# Patient Record
Sex: Male | Born: 1975
Health system: Southern US, Community
[De-identification: ages and names within clinical notes are randomized; demographics above are authoritative.]

## PROBLEM LIST (undated history)

## (undated) VITALS — BP 147/85 | HR 83 | Temp 96.8°F | Resp 16

## (undated) DIAGNOSIS — F1010 Alcohol abuse, uncomplicated: Secondary | ICD-10-CM

## (undated) DIAGNOSIS — R569 Unspecified convulsions: Secondary | ICD-10-CM

---

## 2013-05-23 ENCOUNTER — Emergency Department (HOSPITAL_COMMUNITY)
Admission: EM | Admit: 2013-05-23 | Discharge: 2013-05-23 | Disposition: A | Discharge status: Home / Self Care | Payer: No Typology Code available for payment source | Attending: Emergency Medicine | Admitting: Emergency Medicine

## 2013-05-23 DIAGNOSIS — S40019A Contusion of unspecified shoulder, initial encounter: Secondary | ICD-10-CM | POA: Insufficient documentation

## 2013-05-23 DIAGNOSIS — S40011A Contusion of right shoulder, initial encounter: Secondary | ICD-10-CM

## 2013-05-23 DIAGNOSIS — Y939 Activity, unspecified: Secondary | ICD-10-CM | POA: Insufficient documentation

## 2013-05-23 DIAGNOSIS — Y92410 Unspecified street and highway as the place of occurrence of the external cause: Secondary | ICD-10-CM | POA: Insufficient documentation

## 2013-05-23 MED ORDER — IBUPROFEN 400 MG PO TABS
800.0000 mg | ORAL_TABLET | Freq: Once | ORAL | Status: AC
  Administered 2013-05-23: 800 mg via ORAL
  Filled 2013-05-23: qty 2

## 2013-05-23 MED ORDER — CYCLOBENZAPRINE HCL 10 MG PO TABS: 10.0000 mg | ORAL_TABLET | Freq: Two times a day (BID) | ORAL | Status: DC | PRN

## 2013-05-23 MED ORDER — IBUPROFEN 800 MG PO TABS: 800.0000 mg | ORAL_TABLET | Freq: Three times a day (TID) | ORAL | Status: DC
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
## 2013-05-23 NOTE — ED Notes (Signed)
Pt comfortable with d/c and f/u instructions. Prescriptions x1 

## 2013-05-24 NOTE — ED Provider Notes (Signed)
Medical screening examination/treatment/procedure(s) were performed by non-physician practitioner and as supervising physician I was immediately available for consultation/collaboration.  Reana Chacko M Chosen Geske, MD 05/24/13 0013 

## 2013-06-15 ENCOUNTER — Encounter (HOSPITAL_COMMUNITY): Payer: Self-pay

## 2013-06-15 ENCOUNTER — Emergency Department (HOSPITAL_COMMUNITY)
Admission: EM | Admit: 2013-06-15 | Discharge: 2013-06-15 | Disposition: A | Discharge status: Home / Self Care | Payer: Self-pay | Attending: Emergency Medicine | Admitting: Emergency Medicine

## 2013-06-15 DIAGNOSIS — F419 Anxiety disorder, unspecified: Secondary | ICD-10-CM

## 2013-06-15 DIAGNOSIS — F411 Generalized anxiety disorder: Secondary | ICD-10-CM | POA: Insufficient documentation

## 2013-06-15 DIAGNOSIS — Z791 Long term (current) use of non-steroidal anti-inflammatories (NSAID): Secondary | ICD-10-CM | POA: Insufficient documentation

## 2013-06-15 DIAGNOSIS — F10939 Alcohol use, unspecified with withdrawal, unspecified: Secondary | ICD-10-CM | POA: Insufficient documentation

## 2013-06-15 DIAGNOSIS — F10239 Alcohol dependence with withdrawal, unspecified: Secondary | ICD-10-CM

## 2013-06-15 HISTORY — DX: Alcohol abuse, uncomplicated: F10.10

## 2013-06-15 HISTORY — DX: Unspecified convulsions: R56.9

## 2013-06-15 LAB — CBC
Hemoglobin: 14.3 g/dL (ref 13.0–17.0)
MCH: 35 pg — ABNORMAL HIGH (ref 26.0–34.0)
RBC: 4.09 MIL/uL — ABNORMAL LOW (ref 4.22–5.81)

## 2013-06-15 LAB — BASIC METABOLIC PANEL
CO2: 24 mEq/L (ref 19–32)
Calcium: 9.7 mg/dL (ref 8.4–10.5)
Chloride: 94 mEq/L — ABNORMAL LOW (ref 96–112)
Glucose, Bld: 101 mg/dL — ABNORMAL HIGH (ref 70–99)
Sodium: 133 mEq/L — ABNORMAL LOW (ref 135–145)

## 2013-06-15 LAB — RAPID URINE DRUG SCREEN, HOSP PERFORMED
Amphetamines: NOT DETECTED
Barbiturates: NOT DETECTED
Benzodiazepines: NOT DETECTED
Cocaine: NOT DETECTED
Tetrahydrocannabinol: NOT DETECTED

## 2013-06-15 MED ORDER — LORAZEPAM 1 MG PO TABS: 1.0000 mg | ORAL_TABLET | Freq: Four times a day (QID) | ORAL | Status: DC | PRN

## 2013-06-15 NOTE — ED Notes (Signed)
Bed: WA15 Expected date:  Expected time:  Means of arrival:  Comments: ems 

## 2013-06-15 NOTE — Progress Notes (Signed)
[facility] provided patient with a [facility] Orange Card, highlighting [facility].  °

## 2013-06-15 NOTE — ED Provider Notes (Signed)
CSN: [id number]     Arrival date & time 06/15/13  1334 History   First MD Initiated Contact with Patient 06/15/13 1348     Chief Complaint  Patient presents with  . Seizures   (Consider location/radiation/quality/duration/timing/severity/associated sxs/prior Treatment) HPI Comments: 37 y/o male with a PMHx of alcohol abuse presents to the ED via EMS after having a witness seizure lasting about 1 minute while patient was at work. 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 He did not fall, and brother took him out of the lifter. Admits to drinking half of a 40-oz beer yesterday, and is trying to detox himself from alcohol use. He went through detox about 7 months ago, began drinking again a month later. Prior to yesterday, has not had a drink in a week, tends to get "shaky" and anxious when he does not drink. States "drinking is the only thing that makes me stop shaking". 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 Currently denies any other complaints at this time.  Patient is a 37 y.o. male presenting with seizures. The history is provided by the patient and the EMS personnel.  Seizures   No past medical history on file. No past surgical history on file. No family history on file. History  Substance Use Topics  . Smoking status: Not on file  . Smokeless tobacco: Not on file  . Alcohol Use: Not on file    Review of Systems  Constitutional: Negative for fever, chills and activity change.  Respiratory: Negative for shortness of breath.   Cardiovascular: Negative for chest pain.  Gastrointestinal: Negative for nausea and vomiting.  Neurological: Positive for seizures. Negative for syncope, facial asymmetry and speech difficulty.  All other systems reviewed and are negative.    Allergies  Review of patient's allergies indicates no known allergies.  Home Medications    Current Outpatient Rx  Name  Route  Sig  Dispense  Refill  . cyclobenzaprine (FLEXERIL) 10 MG tablet   Oral   Take 1 tablet (10 mg total) by mouth 2 (two) times daily as needed for muscle spasms.   15 tablet   0   . ibuprofen (ADVIL,MOTRIN) 800 MG tablet   Oral   Take 1 tablet (800 mg total) by mouth 3 (three) times daily.   21 tablet   0    BP 127/84  Pulse 94  Temp(Src) 99 F (37.2 C) (Oral)  Resp 18  SpO2 96% Physical Exam  Nursing note and vitals reviewed. Constitutional: He is oriented to person, place, and time. He appears well-developed and well-nourished. No distress.  HENT:  Head: Normocephalic and atraumatic.  Mouth/Throat: Oropharynx is clear and moist.  Eyes: Conjunctivae and EOM are normal. Pupils are equal, round, and reactive to light.  Neck: Normal range of motion. Neck supple.  Cardiovascular: Normal rate, regular rhythm and normal heart sounds.   Pulmonary/Chest: Effort normal and breath sounds normal.  Abdominal: Soft. Bowel sounds are normal. There is no tenderness.  Musculoskeletal: Normal range of motion. He exhibits no edema.  Neurological: He is alert and oriented to person, place, and time. He has normal strength. No sensory deficit. He displays no seizure activity. GCS eye subscore is 4. GCS verbal subscore is 5. GCS motor subscore is 6.  Shaky  Skin: Skin is warm and dry. He is not diaphoretic.  Psychiatric: His speech is normal and  behavior is normal. Thought content normal. His mood appears anxious. He expresses no homicidal and no suicidal ideation.    ED Course  Procedures (including critical care time) Labs Review Labs Reviewed  CBC  BASIC METABOLIC PANEL  URINE RAPID DRUG SCREEN (HOSP PERFORMED)  ETHANOL   Imaging Review No results found.  MDM   1. Anxiety   2. Alcohol withdrawal    Patient noted to be "shaky" while at work today. Concern for withdrawal seizure. In the ED, he is in NAD, PE unremarkable other than shakiness.  After laying down for a while, symptoms resolved on own. Labs unremarkable. Resources given for detox. Rx for ativan. Return precautions discussed.Patient states understanding of treatment care plan and is agreeable.    Trevor Mace, PA-C 06/15/13 [phone]

## 2013-06-15 NOTE — ED Notes (Signed)
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 Patient did not fall. Patient's brother reports that he patient has been trying to detox him from alcohol due to lack of insurance. Patient's brother states that the patient last had a 40-ounce beer yesterday. Patient has a history of seizures from when he tried to detox himself from alcohol.

## 2013-06-16 NOTE — ED Provider Notes (Signed)
Medical screening examination/treatment/procedure(s) were performed by non-physician practitioner and as supervising physician I was immediately available for consultation/collaboration.  Bertis Hustead, MD 06/16/13 1025 

## 2013-12-15 ENCOUNTER — Emergency Department (HOSPITAL_COMMUNITY)
Admission: EM | Admit: 2013-12-15 | Discharge: 2013-12-16 | Disposition: A | Discharge status: Home / Self Care | Payer: Self-pay | Attending: Emergency Medicine | Admitting: Emergency Medicine

## 2013-12-15 ENCOUNTER — Encounter (HOSPITAL_COMMUNITY): Payer: Self-pay | Admitting: Emergency Medicine

## 2013-12-15 ENCOUNTER — Emergency Department (HOSPITAL_COMMUNITY): Payer: Self-pay

## 2013-12-15 DIAGNOSIS — Z791 Long term (current) use of non-steroidal anti-inflammatories (NSAID): Secondary | ICD-10-CM | POA: Insufficient documentation

## 2013-12-15 DIAGNOSIS — G40909 Epilepsy, unspecified, not intractable, without status epilepticus: Secondary | ICD-10-CM | POA: Insufficient documentation

## 2013-12-15 DIAGNOSIS — F17200 Nicotine dependence, unspecified, uncomplicated: Secondary | ICD-10-CM | POA: Insufficient documentation

## 2013-12-15 DIAGNOSIS — J309 Allergic rhinitis, unspecified: Secondary | ICD-10-CM | POA: Insufficient documentation

## 2013-12-15 MED ORDER — LORATADINE 10 MG PO TABS: 10.0000 mg | ORAL_TABLET | Freq: Every day | ORAL | Status: DC

## 2013-12-15 MED ORDER — LORATADINE 10 MG PO TABS
10.0000 mg | ORAL_TABLET | Freq: Once | ORAL | Status: AC
  Administered 2013-12-16: 10 mg via ORAL
  Filled 2013-12-15: qty 1

## 2013-12-15 NOTE — ED Provider Notes (Signed)
CSN: [id number]     Arrival date & time 12/15/13  2104 History  This chart was scribed for non-physician practitioner Cherrie DistanceFrances Amitai Delaughter, PA-C working with Shanna CiscoMegan E Docherty, MD by Danella Maiersaroline Early, ED Scribe. This patient was seen in room TR05C/TR05C and the patient's care was started at 10:39 PM.    Chief Complaint  Patient presents with  . Nasal Congestion   The history is provided by the patient. No language interpreter was used.   HPI Comments: Dwayne Bolton is a [age] y.o. male who presents to the Emergency Department complaining of nasal congestion with non-productive cough, clear rhinorrhea, and raspy voice onset 2 days ago. He has tried Delsym and Halls cough drops with some relief. Pt denies fevers, chills, sore throat, facial pain.   Past Medical History  Diagnosis Date  . Alcohol abuse   . Seizure    History reviewed. No pertinent past surgical history. Family History  Problem Relation Age of Onset  . Diabetes Mother   . Hypertension Mother    History  Substance Use Topics  . Smoking status: Current Every Day Smoker -- 1.00 packs/day    Types: Cigarettes  . Smokeless tobacco: Never Used  . Alcohol Use: Yes     Comment: 1 40 ounce daily    Review of Systems  Constitutional: Negative for fever and chills.  HENT: Positive for congestion, rhinorrhea and voice change. Negative for sinus pressure and sore throat.   Respiratory: Positive for cough.   All other systems reviewed and are negative.      Allergies  Review of patient's allergies indicates no known allergies.  Home Medications   Current Outpatient Rx  Name  Route  Sig  Dispense  Refill  . cyclobenzaprine (FLEXERIL) 10 MG tablet   Oral   Take 1 tablet (10 mg total) by mouth 2 (two) times daily as needed for muscle spasms.   15 tablet   0   . ibuprofen (ADVIL,MOTRIN) 800 MG tablet   Oral   Take 1 tablet (800 mg total) by mouth 3 (three) times daily.   21 tablet   0   . LORazepam (ATIVAN) 1 MG tablet    Oral   Take 1 tablet (1 mg total) by mouth every 6 (six) hours as needed for anxiety.   10 tablet   0    BP 139/94  Pulse 118  Temp(Src) 98.8 F (37.1 C) (Oral)  Resp 20  Wt 190 lb (86.183 kg)  SpO2 97% Physical Exam  Nursing note and vitals reviewed. Constitutional: He is oriented to person, place, and time. He appears well-developed and well-nourished. No distress.  HENT:  Head: Normocephalic and atraumatic.  Right Ear: External ear normal.  Left Ear: External ear normal.  Mouth/Throat: Oropharynx is clear and moist. No oropharyngeal exudate.  Boggy nasal mucosa, clear rhinorrhea  Eyes: Conjunctivae are normal. Pupils are equal, round, and reactive to light. No scleral icterus.  Neck: Normal range of motion. Neck supple.  Cardiovascular: Normal rate, regular rhythm and normal heart sounds.  Exam reveals no gallop and no friction rub.   No murmur heard. Pulmonary/Chest: Effort normal and breath sounds normal. No respiratory distress. He has no wheezes. He has no rales. He exhibits no tenderness.  Abdominal: Soft. Bowel sounds are normal. He exhibits no distension. There is no tenderness. There is no rebound and no guarding.  Musculoskeletal: Normal range of motion. He exhibits no edema and no tenderness.  Lymphadenopathy:    He has no cervical  adenopathy.  Neurological: He is alert and oriented to person, place, and time. He exhibits normal muscle tone. Coordination normal.  Skin: Skin is warm and dry. No rash noted. No erythema. No pallor.  Psychiatric: He has a normal mood and affect. His behavior is normal. Judgment and thought content normal.    ED Course  Procedures (including critical care time) Medications - No data to display  DIAGNOSTIC STUDIES: Oxygen Saturation is 97% on RA, normal by my interpretation.    COORDINATION OF CARE: 10:43 PM- Discussed treatment plan with pt. Pt agrees to plan.    Labs Review Labs Reviewed - No data to display Imaging  Review Dg Chest 2 View  12/15/2013   CLINICAL DATA:  Congestion, cough  EXAM: CHEST  2 VIEW  COMPARISON:  None.  FINDINGS: The cardiac and mediastinal silhouettes are within normal limits.  The lungs are normally inflated. No airspace consolidation, pleural effusion, or pulmonary edema is identified. There is no pneumothorax.  No acute osseous abnormality identified.  IMPRESSION: No active cardiopulmonary disease.   Electronically Signed   By: Rise Mu M.D.   On: 12/15/2013 22:18    EKG Interpretation   None       MDM   Allergic rhinitis  Patient here with allergic rhinitis type symptoms, afebrile and no other complaints but the nasal congestion, noted to be tachycardic while here, when I asked for this to be reassessed, it was determined that the patient left without letting staff know.  I do not suspect any other acute emergency medical condition at this time and was preparing to discharge him.  I personally performed the services described in this documentation, which was scribed in my presence. The recorded information has been reviewed and is accurate.    Dwayne Bolton Dwayne Bolton 12/15/13 2321  12:35 AM Patient has returned to the department, still noted with tachycardia, up to 130's.  He denies chest pain, shortness of breath, cough, congestion, he states this is because "I don't like you people" and that he has white coat syndrome.  EKG done which shows sinus tachycardia and nonspecific ST wave abnormality but nothing acute.  Repeat of heart rate is 114.  I do not suspect any acute emergency medical condition at this time.  Dwayne Bolton Dwayne Bolton, [place] 12/16/13 [phone]

## 2013-12-15 NOTE — ED Notes (Signed)
Nasal/chest congestion. Non-productive cough, raspy voice. No fevers

## 2013-12-16 MED ORDER — LORATADINE 10 MG PO TABS: 10.0000 mg | ORAL_TABLET | Freq: Every day | ORAL | Status: DC

## 2013-12-16 NOTE — ED Provider Notes (Signed)
Medical screening examination/treatment/procedure(s) were performed by non-physician practitioner and as supervising physician I was immediately available for consultation/collaboration.   EKG Interpretation   Date/Time:  Thursday December 15 2013 23:55:51 EST Ventricular Rate:  117 PR Interval:  142 QRS Duration: 84 QT Interval:  326 QTC Calculation: 454 R Axis:   51 Text Interpretation:  Sinus tachycardia Septal infarct , age undetermined  No old tracing to compare Confirmed by Shannell Mikkelsen  MD, Seung Nidiffer [phone]) on  12/16/2013 12:01:10 AM Also confirmed by Micheline MazeHERTY  MD, Dunia Pringle [phone]  on  12/16/2013 1:08:01 PM        Shanna CiscoMegan E Ameri Cahoon, MD 12/16/13 1308

## 2013-12-16 NOTE — Discharge Instructions (Signed)
Allergic Rhinitis Allergic rhinitis is when the mucous membranes in the nose respond to allergens. Allergens are particles in the air that cause your body to have an allergic reaction. This causes you to release allergic antibodies. Through a chain of events, these eventually cause you to release histamine into the blood stream. Although meant to protect the body, it is this release of histamine that causes your discomfort, such as frequent sneezing, congestion, and an itchy, runny nose.  CAUSES  Seasonal allergic rhinitis (hay fever) is caused by pollen allergens that may come from grasses, trees, and weeds. Year-round allergic rhinitis (perennial allergic rhinitis) is caused by allergens such as house dust mites, pet dander, and mold spores.  SYMPTOMS   Nasal stuffiness (congestion).  Itchy, runny nose with sneezing and tearing of the eyes. DIAGNOSIS  Your health care provider can help you determine the allergen or allergens that trigger your symptoms. If you and your health care provider are unable to determine the allergen, skin or blood testing may be used. TREATMENT  Allergic Rhinitis does not have a cure, but it can be controlled by:  Medicines and allergy shots (immunotherapy).  Avoiding the allergen. Hay fever may often be treated with antihistamines in pill or nasal spray forms. Antihistamines block the effects of histamine. There are over-the-counter medicines that may help with nasal congestion and swelling around the eyes. Check with your health care provider before taking or giving this medicine.  If avoiding the allergen or the medicine prescribed do not work, there are many new medicines your health care provider can prescribe. Stronger medicine may be used if initial measures are ineffective. Desensitizing injections can be used if medicine and avoidance does not work. Desensitization is when a patient is given ongoing shots until the body becomes less sensitive to the allergen.  Make sure you follow up with your health care provider if problems continue. HOME CARE INSTRUCTIONS It is not possible to completely avoid allergens, but you can reduce your symptoms by taking steps to limit your exposure to them. It helps to know exactly what you are allergic to so that you can avoid your specific triggers. SEEK MEDICAL CARE IF:   You have a fever.  You develop a cough that does not stop easily (persistent).  You have shortness of breath.  You start wheezing.  Symptoms interfere with normal daily activities. Document Released: 07/01/2001 Document Revised: 07/27/2013 Document Reviewed: 06/13/2013 [employer] Patient Information 2014 [employer].  

## 2014-01-10 ENCOUNTER — Emergency Department (HOSPITAL_COMMUNITY)
Admission: EM | Admit: 2014-01-10 | Discharge: 2014-01-11 | Disposition: A | Discharge status: Other Acute Inpatient Hospital | Payer: Self-pay | Attending: Emergency Medicine | Admitting: Emergency Medicine

## 2014-01-10 ENCOUNTER — Encounter (HOSPITAL_COMMUNITY): Payer: Self-pay | Admitting: Emergency Medicine

## 2014-01-10 DIAGNOSIS — Z8669 Personal history of other diseases of the nervous system and sense organs: Secondary | ICD-10-CM | POA: Insufficient documentation

## 2014-01-10 DIAGNOSIS — F1020 Alcohol dependence, uncomplicated: Secondary | ICD-10-CM | POA: Insufficient documentation

## 2014-01-10 DIAGNOSIS — F17200 Nicotine dependence, unspecified, uncomplicated: Secondary | ICD-10-CM | POA: Insufficient documentation

## 2014-01-10 LAB — CBC
HEMATOCRIT: 46.3 % (ref 39.0–52.0)
HEMOGLOBIN: 17 g/dL (ref 13.0–17.0)
MCH: 34 pg (ref 26.0–34.0)
MCHC: 36.7 g/dL — ABNORMAL HIGH (ref 30.0–36.0)
MCV: 92.6 fL (ref 78.0–100.0)
Platelets: 318 10*3/uL (ref 150–400)
RBC: 5 MIL/uL (ref 4.22–5.81)
RDW: 13.4 % (ref 11.5–15.5)
WBC: 8 10*3/uL (ref 4.0–10.5)

## 2014-01-10 NOTE — ED Notes (Signed)
Pt presents with c/o medical clearance. Pt says that he is requesting detox from ETOH. Pt also says that he does have seizures when he goes through withdrawals and he does not want the same to happen this time. Pt denies SI/HI.

## 2014-01-10 NOTE — BH Assessment (Signed)
Assessment Note  Dwayne Bolton is an 38 y.o. male who presents with his brother and brother in-law seeking detox from alcohol.  He elected to have them present for the assessment.  The patient reports he's been drinking 12 beers daily for the last 4 years, but admits that he has been drinking more over the last week.  They report that he has always been an annoying drunk and that he isolates himself and has not been keeping up with his hygiene.  He lives with his brother and his brother's wife and their 60 year old son.  They have given him an ultimatim to seek treatment, which he has never done before.  He attempted to quit drinking on his own two previous times and had a seizure both times.  His longest period of sobriety is 11 days.  They report that he has little insight into the problems he is causing.  They also report that before this binge started, his daughter wanted to come visit him and wasn't able to because he doesn't have his own place.  He "was in his feelings" about it and went to stay at a hotel for a week and drank even more heavily than usual.  He denies any SI and depression, but does endorse some depressive symptoms including isolating behavior and irritability and feelings of guilt.  His family also reports that he has decreased concentration, grooming, and memory.  They report that his alcohol use has affected his personal relationship and that he does not take responsibility for his actions.  This Clinical research associate consulted with Donell Sievert, [facility], who accepted the patient for admission to [facility] room 303-2-there are no male detox beds at [facility] or [facility].  The patient will be transported to [facility] by Cathren Harsh charge RN, Elmarie Shiley, notified that patient has a bed at [facility] pending medical clearance.  Dr Freida Busman, EDP, at [facility] also notified that patient has a bed at [facility] pending medical clearance.      Axis I: Substance Abuse and Substance Induced Mood Disorder Axis II: Deferred Axis III:  Past Medical  History  Diagnosis Date  . Alcohol abuse   . Seizure    Axis IV: economic problems, housing problems, occupational problems, problems with access to health care services and problems with primary support group Axis V: 41-50 serious symptoms  Past Medical History:  Past Medical History  Diagnosis Date  . Alcohol abuse   . Seizure     No past surgical history on file.  Family History:  Family History  Problem Relation Age of Onset  . Diabetes Mother   . Hypertension Mother     Social History:  reports that he has been smoking Cigarettes.  He has been smoking about 1.00 pack per day. He has never used smokeless tobacco. He reports that he drinks alcohol. He reports that he does not use illicit drugs.  Additional Social History:  Alcohol / Drug Use History of alcohol / drug use?: Yes Longest period of sobriety (when/how long): 11 days Negative Consequences of Use: Personal relationships Withdrawal Symptoms: DTs;Seizures Onset of Seizures: when abstaining from alcohol.   Date of most recent seizure: unsure-last time he attempted to quit drinking Substance #1 Name of Substance 1: Alcohol 1 - Age of First Use: early 30s 1 - Amount (size/oz): 12 beers plus a pint and a fifth of brown liquor 1 - Frequency: daily 1 - Duration: 4 years (12 beers for 4 years, addition of liquor for one  week) 1 - Last Use / Amount: 01/10/14 40 oz  CIWA:   COWS:    Allergies: No Known Allergies  Home Medications:  No prescriptions prior to admission    OB/GYN Status:  No LMP for male patient.  General Assessment Data Location of Assessment: [facility] Is this a Tele or Face-to-Face Assessment?: Face-to-Face Is this an Initial Assessment or a Re-assessment for this encounter?: Initial Assessment Living Arrangements: Other relatives (brother, sister in-law, 36 year old niece) Can pt return to current living arrangement?: Yes Admission Status: Voluntary Is patient capable of  signing voluntary admission?: Yes Transfer from: Home Referral Source: Self/Family/Friend     [facility] Living Arrangements: Other relatives (brother, sister in-law, 36 year old niece)  Education Status Is patient currently in school?: No Highest grade of school patient has completed: 12  Risk to self Suicidal Ideation: No Suicidal Intent: No Is patient at risk for suicide?: No Suicidal Plan?: No Access to Means: No What has been your use of drugs/alcohol within the last 12 months?: daily alcohol use Previous Attempts/Gestures: No How many times?: 0 Intentional Self Injurious Behavior: None Family Suicide History: No Recent stressful life event(s): Conflict (Comment) (somewhat estranged from young daughter) Persecutory voices/beliefs?: No Depression: Yes Depression Symptoms: Despondent;Insomnia;Isolating;Fatigue;Guilt;Loss of interest in usual pleasures;Feeling worthless/self pity;Feeling angry/irritable Substance abuse history and/or treatment for substance abuse?: Yes Suicide prevention information given to non-admitted patients: Not applicable  Risk to Others Homicidal Ideation: No Thoughts of Harm to Others: No Current Homicidal Intent: No Current Homicidal Plan: No Access to Homicidal Means: No History of harm to others?: No Assessment of Violence: None Noted Does patient have access to weapons?: No Criminal Charges Pending?: No Does patient have a court date: No  Psychosis Hallucinations: None noted Delusions: None noted  Mental Status Report Appear/Hygiene: Disheveled Eye Contact: Poor Motor Activity: Freedom of movement Speech: Slurred Level of Consciousness: Quiet/awake;Drowsy Mood: Depressed Affect: Blunted Anxiety Level: None Thought Processes: Coherent Judgement: Impaired Orientation: Person;Place;Time;Situation Obsessive Compulsive Thoughts/Behaviors: None  Cognitive Functioning Concentration: Decreased Memory: Recent  Impaired;Remote Intact IQ: Average Insight: Poor Impulse Control: Poor Appetite: Poor Weight Loss:  (unk) Weight Gain: 0 Sleep: No Change Total Hours of Sleep:  (unk) Vegetative Symptoms: Staying in bed;Decreased grooming  ADLScreening [facility]) Patient's cognitive ability adequate to safely complete daily activities?: Yes Patient able to express need for assistance with ADLs?: Yes Independently performs ADLs?: Yes (appropriate for developmental age)  Prior Inpatient Therapy Prior Inpatient Therapy: No  Prior Outpatient Therapy Prior Outpatient Therapy: No  ADL Screening (condition at time of admission) Patient's cognitive ability adequate to safely complete daily activities?: Yes Patient able to express need for assistance with ADLs?: Yes Independently performs ADLs?: Yes (appropriate for developmental age)       Abuse/Neglect Assessment (Assessment to be complete while patient is alone) Physical Abuse: Denies Verbal Abuse: Denies Sexual Abuse: Denies Exploitation of patient/patient's resources: Denies     Merchant navy officerAdvance Directives (For Healthcare) Advance Directive: Patient does not have advance directive;Patient would not like information Nutrition Screen- [facility] Adult/[facility]/AP Patient's home diet: Regular  Additional Information 1:1 In Past 12 Months?: No CIRT Risk: No Elopement Risk: No Does patient have medical clearance?: Yes     Disposition:  Disposition Initial Assessment Completed for this Encounter: Yes Disposition of Patient: Inpatient treatment program Type of inpatient treatment program: Adult  On Site Evaluation by:   Reviewed with Physician:    Steward RosDavee Lomax, [employer] [place] 01/10/2014 11:10 PM

## 2014-01-11 ENCOUNTER — Encounter (HOSPITAL_COMMUNITY): Payer: Self-pay | Admitting: *Deleted

## 2014-01-11 ENCOUNTER — Inpatient Hospital Stay (HOSPITAL_COMMUNITY)
Admission: RE | Admit: 2014-01-11 | Discharge: 2014-01-13 | DRG: 897 | Disposition: A | Discharge status: Home / Self Care | Payer: Federal, State, Local not specified - Other | Attending: Psychiatry | Admitting: Psychiatry

## 2014-01-11 DIAGNOSIS — R748 Abnormal levels of other serum enzymes: Secondary | ICD-10-CM | POA: Diagnosis present

## 2014-01-11 DIAGNOSIS — R569 Unspecified convulsions: Secondary | ICD-10-CM

## 2014-01-11 DIAGNOSIS — F4323 Adjustment disorder with mixed anxiety and depressed mood: Secondary | ICD-10-CM | POA: Diagnosis present

## 2014-01-11 DIAGNOSIS — Z833 Family history of diabetes mellitus: Secondary | ICD-10-CM

## 2014-01-11 DIAGNOSIS — F1020 Alcohol dependence, uncomplicated: Secondary | ICD-10-CM | POA: Diagnosis present

## 2014-01-11 DIAGNOSIS — F17200 Nicotine dependence, unspecified, uncomplicated: Secondary | ICD-10-CM | POA: Diagnosis present

## 2014-01-11 DIAGNOSIS — F10939 Alcohol use, unspecified with withdrawal, unspecified: Principal | ICD-10-CM | POA: Diagnosis present

## 2014-01-11 DIAGNOSIS — F10239 Alcohol dependence with withdrawal, unspecified: Principal | ICD-10-CM | POA: Diagnosis present

## 2014-01-11 DIAGNOSIS — F411 Generalized anxiety disorder: Secondary | ICD-10-CM | POA: Diagnosis present

## 2014-01-11 DIAGNOSIS — Z8249 Family history of ischemic heart disease and other diseases of the circulatory system: Secondary | ICD-10-CM

## 2014-01-11 DIAGNOSIS — F329 Major depressive disorder, single episode, unspecified: Secondary | ICD-10-CM | POA: Diagnosis present

## 2014-01-11 LAB — COMPREHENSIVE METABOLIC PANEL
ALK PHOS: 75 U/L (ref 39–117)
ALT: 64 U/L — AB (ref 0–53)
AST: 80 U/L — ABNORMAL HIGH (ref 0–37)
Albumin: 5.2 g/dL (ref 3.5–5.2)
BILIRUBIN TOTAL: 0.5 mg/dL (ref 0.3–1.2)
BUN: 9 mg/dL (ref 6–23)
CHLORIDE: 94 meq/L — AB (ref 96–112)
CO2: 23 meq/L (ref 19–32)
Calcium: 9.6 mg/dL (ref 8.4–10.5)
Creatinine, Ser: 0.82 mg/dL (ref 0.50–1.35)
GLUCOSE: 103 mg/dL — AB (ref 70–99)
POTASSIUM: 4.4 meq/L (ref 3.7–5.3)
SODIUM: 141 meq/L (ref 137–147)
Total Protein: 8.7 g/dL — ABNORMAL HIGH (ref 6.0–8.3)

## 2014-01-11 LAB — SALICYLATE LEVEL: Salicylate Lvl: <2 mg/dL — ABNORMAL LOW (ref 2.8–20.0)

## 2014-01-11 LAB — ACETAMINOPHEN LEVEL

## 2014-01-11 LAB — RAPID URINE DRUG SCREEN, HOSP PERFORMED
Amphetamines: NOT DETECTED
BARBITURATES: NOT DETECTED
Benzodiazepines: NOT DETECTED
COCAINE: NOT DETECTED
Opiates: NOT DETECTED
TETRAHYDROCANNABINOL: NOT DETECTED

## 2014-01-11 LAB — ETHANOL
ALCOHOL ETHYL (B): 294 mg/dL — AB (ref 0–11)
ALCOHOL ETHYL (B): 188 mg/dL — AB (ref 0–11)
Alcohol, Ethyl (B): 386 mg/dL — ABNORMAL HIGH (ref 0–11)

## 2014-01-11 MED ORDER — CHLORDIAZEPOXIDE HCL 25 MG PO CAPS
25.0000 mg | ORAL_CAPSULE | ORAL | Status: DC
  Administered 2014-01-13: 25 mg via ORAL
  Filled 2014-01-11: qty 1

## 2014-01-11 MED ORDER — HYDROXYZINE HCL 25 MG PO TABS
25.0000 mg | ORAL_TABLET | Freq: Four times a day (QID) | ORAL | Status: DC | PRN
  Administered 2014-01-11: 25 mg via ORAL
  Filled 2014-01-11: qty 1

## 2014-01-11 MED ORDER — TRAZODONE HCL 50 MG PO TABS
50.0000 mg | ORAL_TABLET | Freq: Every evening | ORAL | Status: DC | PRN
  Administered 2014-01-11 – 2014-01-12 (×2): 50 mg via ORAL
  Filled 2014-01-11 (×2): qty 1

## 2014-01-11 MED ORDER — CHLORDIAZEPOXIDE HCL 25 MG PO CAPS: 25.0000 mg | ORAL_CAPSULE | Freq: Every day | ORAL | Status: DC

## 2014-01-11 MED ORDER — CHLORDIAZEPOXIDE HCL 25 MG PO CAPS
25.0000 mg | ORAL_CAPSULE | Freq: Four times a day (QID) | ORAL | Status: AC
  Administered 2014-01-11 (×2): 25 mg via ORAL
  Filled 2014-01-11 (×3): qty 1

## 2014-01-11 MED ORDER — LORAZEPAM 1 MG PO TABS
1.0000 mg | ORAL_TABLET | Freq: Once | ORAL | Status: AC
  Administered 2014-01-11: 1 mg via ORAL
  Filled 2014-01-11: qty 1

## 2014-01-11 MED ORDER — ADULT MULTIVITAMIN W/MINERALS CH
1.0000 | ORAL_TABLET | Freq: Every day | ORAL | Status: DC
  Administered 2014-01-11 – 2014-01-13 (×3): 1 via ORAL
  Filled 2014-01-11 (×5): qty 1

## 2014-01-11 MED ORDER — ACETAMINOPHEN 325 MG PO TABS
650.0000 mg | ORAL_TABLET | Freq: Four times a day (QID) | ORAL | Status: DC | PRN
  Administered 2014-01-12: 650 mg via ORAL
  Filled 2014-01-11: qty 2

## 2014-01-11 MED ORDER — CHLORDIAZEPOXIDE HCL 25 MG PO CAPS
25.0000 mg | ORAL_CAPSULE | Freq: Four times a day (QID) | ORAL | Status: DC | PRN
  Administered 2014-01-11 – 2014-01-12 (×2): 25 mg via ORAL
  Filled 2014-01-11 (×2): qty 1

## 2014-01-11 MED ORDER — LOPERAMIDE HCL 2 MG PO CAPS: 2.0000 mg | ORAL_CAPSULE | ORAL | Status: DC | PRN

## 2014-01-11 MED ORDER — THIAMINE HCL 100 MG/ML IJ SOLN: 100.0000 mg | Freq: Once | INTRAMUSCULAR | Status: DC

## 2014-01-11 MED ORDER — NICOTINE 7 MG/24HR TD PT24
7.0000 mg | MEDICATED_PATCH | Freq: Every day | TRANSDERMAL | Status: DC
  Administered 2014-01-12: 7 mg via TRANSDERMAL
  Filled 2014-01-11 (×4): qty 1

## 2014-01-11 MED ORDER — NICOTINE 21 MG/24HR TD PT24
21.0000 mg | MEDICATED_PATCH | Freq: Every day | TRANSDERMAL | Status: DC
  Administered 2014-01-11: 21 mg via TRANSDERMAL
  Filled 2014-01-11 (×2): qty 1

## 2014-01-11 MED ORDER — ONDANSETRON 4 MG PO TBDP: 4.0000 mg | ORAL_TABLET | Freq: Four times a day (QID) | ORAL | Status: DC | PRN

## 2014-01-11 MED ORDER — MAGNESIUM HYDROXIDE 400 MG/5ML PO SUSP
30.0000 mL | Freq: Every day | ORAL | Status: DC | PRN
Start: 2014-01-11 — End: 2014-01-13

## 2014-01-11 MED ORDER — VITAMIN B-1 100 MG PO TABS
100.0000 mg | ORAL_TABLET | Freq: Every day | ORAL | Status: DC
  Administered 2014-01-12 – 2014-01-13 (×2): 100 mg via ORAL
  Filled 2014-01-11 (×3): qty 1

## 2014-01-11 MED ORDER — ALUM & MAG HYDROXIDE-SIMETH 200-200-20 MG/5ML PO SUSP: 30.0000 mL | ORAL | Status: DC | PRN

## 2014-01-11 MED ORDER — CHLORDIAZEPOXIDE HCL 25 MG PO CAPS
25.0000 mg | ORAL_CAPSULE | Freq: Three times a day (TID) | ORAL | Status: AC
  Administered 2014-01-12 (×3): 25 mg via ORAL
  Filled 2014-01-11 (×3): qty 1

## 2014-01-11 MED ORDER — CHLORDIAZEPOXIDE HCL 25 MG PO CAPS
25.0000 mg | ORAL_CAPSULE | Freq: Once | ORAL | Status: AC
  Administered 2014-01-11: 25 mg via ORAL

## 2014-01-11 NOTE — ED Notes (Signed)
Per Dr. Norlene Campbelltter, ETOH level needs to be redrawn at 3:30 a.m.

## 2014-01-11 NOTE — ED Provider Notes (Signed)
CSN: [id number]     Arrival date & time 01/10/14  2301 History   First MD Initiated Contact with Patient 01/11/14 [id number]     Chief Complaint  Patient presents with  . Medical Clearance     (Consider location/radiation/quality/duration/timing/severity/associated sxs/prior Treatment) HPI 38 year old male presents to emergency room requesting detox from alcohol.  Patient reports he has history of alcohol withdrawal seizures.  He has had prior seizures in July and August of this past year.  He reports he has been binge drinking for some time.  He denies any other substance abuse.  No prior history of prolonged sobriety.   Past Medical History  Diagnosis Date  . Alcohol abuse   . Seizure    History reviewed. No pertinent past surgical history. Family History  Problem Relation Age of Onset  . Diabetes Mother   . Hypertension Mother    History  Substance Use Topics  . Smoking status: Current Every Day Smoker -- 1.00 packs/day    Types: Cigarettes  . Smokeless tobacco: Never Used  . Alcohol Use: Yes     Comment: 1 40 ounce daily    Review of Systems   See History of Present Illness; otherwise all other systems are reviewed and negative  Allergies  Review of patient's allergies indicates no known allergies.  Home Medications  No current outpatient prescriptions on file. BP 142/91  Pulse 111  Temp(Src) 98.7 F (37.1 C) (Oral)  Resp 20  Ht 5\' 9"  (1.753 m)  Wt 174 lb 2 oz (78.983 kg)  BMI 25.70 kg/m2  SpO2 98% Physical Exam  Nursing note and vitals reviewed. Constitutional: He is oriented to person, place, and time. He appears well-developed and well-nourished.  HENT:  Head: Normocephalic and atraumatic.  Right Ear: External ear normal.  Left Ear: External ear normal.  Nose: Nose normal.  Mouth/Throat: Oropharynx is clear and moist.  Eyes: Conjunctivae and EOM are normal. Pupils are equal, round, and reactive to light.  Neck: Normal range of motion. Neck supple. No JVD  present. No tracheal deviation present. No thyromegaly present.  Cardiovascular: Normal rate, regular rhythm, normal heart sounds and intact distal pulses.  Exam reveals no gallop and no friction rub.   No murmur heard. Pulmonary/Chest: Effort normal and breath sounds normal. No stridor. No respiratory distress. He has no wheezes. He has no rales. He exhibits no tenderness.  Abdominal: Soft. Bowel sounds are normal. He exhibits no distension and no mass. There is no tenderness. There is no rebound and no guarding.  Musculoskeletal: Normal range of motion. He exhibits no edema and no tenderness.  Lymphadenopathy:    He has no cervical adenopathy.  Neurological: He is alert and oriented to person, place, and time. He has normal reflexes. No cranial nerve deficit. He exhibits normal muscle tone. Coordination normal.  Skin: Skin is warm and dry. No rash noted. No erythema. No pallor.  Psychiatric: He has a normal mood and affect. His behavior is normal. Judgment and thought content normal.    ED Course  Procedures (including critical care time) Labs Review Labs Reviewed  CBC - Abnormal; Notable for the following:    MCHC 36.7 (*)    All other components within normal limits  COMPREHENSIVE METABOLIC PANEL - Abnormal; Notable for the following:    Chloride 94 (*)    Glucose, Bld 103 (*)    Total Protein 8.7 (*)    AST 80 (*)    ALT 64 (*)    All other  components within normal limits  ETHANOL - Abnormal; Notable for the following:    Alcohol, Ethyl (B) 386 (*)    All other components within normal limits  SALICYLATE LEVEL - Abnormal; Notable for the following:    Salicylate Lvl <2.0 (*)    All other components within normal limits  ETHANOL - Abnormal; Notable for the following:    Alcohol, Ethyl (B) 294 (*)    All other components within normal limits  ACETAMINOPHEN LEVEL  URINE RAPID DRUG SCREEN (HOSP PERFORMED)   Imaging Review No results found.   EKG Interpretation None       MDM   Final diagnoses:  Alcoholism    38 year old male with alcoholism, who is requesting detox.  He has been accepted to behavior health, but will need to have an alcohol level below 250.  We'll recheck at 3:30 AM.   Olivia Mackie, MD 01/11/14 [phone]

## 2014-01-11 NOTE — ED Notes (Signed)
Pt resting in room, in no acute distress 

## 2014-01-11 NOTE — Progress Notes (Signed)
[facility]  Dr. Dub MikesLugo is the accepting doctor.  Writer informed the ER MD (Dr. Norlene Campbelltter) and the nurse of the patients disposition.    Per, nurse the patients BAL is 294.  Writer consulted with Donell SievertSpencer Simon, PA-C and once the patients BAL is below 250 then [facility].   Support paperwork has been completed.

## 2014-01-11 NOTE — Progress Notes (Signed)
Patient ID: Demetrios IsaacsROBERT Danner, male   DOB: 1975/11/14, 38 y.o.   MRN: [id number] 38 year old male voluntary admission d/t alcohol uses and depression. Drinks 1 pt and 3 -- 4 40's each day. Has been drinking for 4 years daily. Has tried to stop twice and had a seizure both times. Told ed staff that he drank 12 beers each day. He lives with his brother and family and his brother brought  Him. He had been isolating self has poor hygrine. He has been calm and cooperative denies SI thoughts and withdrawal symptoms of anxiety. Said that he had been taking lorazepam 10 mg 3 4 times a day sense  Auguest when he had a seizure.

## 2014-01-11 NOTE — [facility] Suicide Risk Assessment (Signed)
   Nursing information obtained from:  Patient Demographic factors:  NA Current Mental Status:  NA Loss Factors:  NA Historical Factors:  NA Risk Reduction Factors:  NA Total Time spent with patient: 45 minutes  CLINICAL FACTORS:   Depression:   Anhedonia Comorbid alcohol abuse/dependence Hopelessness Impulsivity Insomnia Recent sense of peace/wellbeing Severe Alcohol/Substance Abuse/Dependencies Previous Psychiatric Diagnoses and Treatments  Psychiatric Specialty Exam: Physical Exam  ROS  Blood pressure 134/95, pulse 98.There is no weight on file to calculate BMI.  General Appearance: Casual and Fairly Groomed  Eye Contact::  Good  Speech:  Clear and Coherent  Volume:  Normal  Mood:  Anxious and Depressed  Affect:  Appropriate and Congruent  Thought Process:  Coherent and Goal Directed  Orientation:  Full (Time, Place, and Person)  Thought Content:  WDL  Suicidal Thoughts:  No  Homicidal Thoughts:  No  Memory:  NA  Judgement:  Fair  Insight:  Fair  Psychomotor Activity:  Normal  Concentration:  Good  Recall:  Good  Fund of Knowledge:Good  Language: Good  Akathisia:  NA  Handed:  Left  AIMS (if indicated):     Assets:  Communication Skills Desire for Improvement Financial Resources/Insurance Housing Intimacy Leisure Time Physical Health Resilience Social Support Talents/Skills  Sleep:      Musculoskeletal: Strength & Muscle Tone: within normal limits Gait & Station: normal Patient leans: N/A  COGNITIVE FEATURES THAT CONTRIBUTE TO RISK:  Closed-mindedness Loss of executive function Polarized thinking Thought constriction (tunnel vision)    SUICIDE RISK:   Moderate:  Frequent suicidal ideation with limited intensity, and duration, some specificity in terms of plans, no associated intent, good self-control, limited dysphoria/symptomatology, some risk factors present, and identifiable protective factors, including available and accessible social  support.  PLAN OF CARE: Admit for alcohol detox treatment And substance induced mood disorder, he will benefit from acute hospitalization, medication management, and case management.   I certify that inpatient services furnished can reasonably be expected to improve the patient's condition.  Jessaca Philippi,JANARDHAHA R. 01/11/2014, 1:49 PM

## 2014-01-11 NOTE — H&P (Signed)
Psychiatric Admission Assessment Adult  Patient Identification:  Dwayne Bolton  Date of Evaluation:  01/11/2014  Chief Complaint:  SUBSTANCE INDUCED MOOD DISORDER ALCOHOL DEPENDENCY  History of Present Illness: Dwayne Bolton is 38 years old male. He reports, "I walked in here yesterday. Medically cleared at the [facility]. I just want to quit drinking. I have been drinking heavily x 2 years. I pint of Vodka + 2 bottles of the 40s daily. I don't think I'm an alcoholic, rather dependent on it. Alcohol keeps me calm. I shake and get anxiety when I am not drinking. I don't use or do drugs. My longest sobriety was from January thru early march of this year, 2 months I would say. I drink because of daily stressors. I'm not depressed, but I have anxiety". Right now, I have the tremors, I'm nervous. I have had seizures last August when coming off of alcohol. I'm on Lorazepam for my anxiety, prescribed. I'm not SIHI".  Elements:  Location:  Alcoholism. Quality:  Tremors, high anxiety. Severity:  Severe, "I can't quit drinking". Timing:  "I have been drinking a lot x 2 years". Duration:  Chronic. Context:  "I drink to stop the shakes".  Associated Signs/Synptoms:  Depression Symptoms:  anxiety,  (Hypo) Manic Symptoms:  Impulsivity,  Anxiety Symptoms:  Excessive Worry,  Psychotic Symptoms:  Hallucinations: Denies  PTSD Symptoms: Had a traumatic exposure:  Denies  Total Time spent with patient: Denies  Psychiatric Specialty Exam: Physical Exam  Constitutional: He is oriented to person, place, and time. He appears well-developed and well-nourished.  HENT:  Head: Normocephalic.  Eyes: Pupils are equal, round, and reactive to light.  Neck: Normal range of motion.  Cardiovascular: Normal rate.   Respiratory: Effort normal.  GI: Soft.  Genitourinary:  Denies any issues in this areas  Musculoskeletal: Normal range of motion.  Neurological: He is alert and oriented to person, place, and  time.  Skin: Skin is warm and dry.  Psychiatric: His speech is normal and behavior is normal. Thought content normal. His mood appears anxious. His affect is not angry, not blunt, not labile and not inappropriate. Cognition and memory are normal. He expresses impulsivity. He does not exhibit a depressed mood.    Review of Systems  Constitutional: Positive for chills, malaise/fatigue and diaphoresis.  HENT: Negative.   Eyes: Negative.   Respiratory: Negative.   Cardiovascular: Negative.   Gastrointestinal: Negative.   Genitourinary: Negative.   Musculoskeletal: Positive for myalgias.  Skin: Negative.   Neurological: Positive for tremors and weakness.  Endo/Heme/Allergies: Negative.   Psychiatric/Behavioral: Positive for substance abuse (Alcoholism). Negative for depression, suicidal ideas, hallucinations and memory loss. The patient is nervous/anxious. The patient does not have insomnia.     Blood pressure 134/95, pulse 98.There is no weight on file to calculate BMI.  General Appearance: Casual and Fairly Groomed  Engineer, water::  Good  Speech:  Clear and Coherent  Volume:  Normal  Mood:  Anxious  Affect:  Congruent  Thought Process:  Coherent, Goal Directed and Intact  Orientation:  Full (Time, Place, and Person)  Thought Content:  Rumination  Suicidal Thoughts:  No  Homicidal Thoughts:  No  Memory:  Immediate;   Good Recent;   Good Remote;   Good  Judgement:  Fair  Insight:  Present  Psychomotor Activity:  Restlessness  Concentration:  Fair  Recall:  Good  Fund of Knowledge:Fair  Language: Good  Akathisia:  No  Handed:  Right  AIMS (if indicated):  Assets:  Desire for Improvement  Sleep:       Musculoskeletal: Strength & Muscle Tone: within normal limits Gait & Station: normal Patient leans: N/A  Past Psychiatric History: Diagnosis: Alcohol Related Disorder - Severe (303.90)  Hospitalizations: [facility]  Outpatient Care: None reported  Substance Abuse  Care: None  Self-Mutilation: Denies  Suicidal Attempts: Denies  Violent Behaviors: Denies   Past Medical History:   Past Medical History  Diagnosis Date  . Alcohol abuse   . Seizure    Seizure History:  Hx of  Allergies:  No Known Allergies  PTA Medications: No prescriptions prior to admission    Previous Psychotropic Medications:  Medication/Dose  See medication lists               Substance Abuse History in the last 12 months:  yes  Consequences of Substance Abuse: Medical Consequences:  Liver damage, Possible death by overdose Legal Consequences:  Arrests, jail time, Loss of driving privilege. Family Consequences:  Family discord, divorce and or separation.  Social History:  reports that he has been smoking Cigarettes.  He has a 1 pack-year smoking history. He uses smokeless tobacco. He reports that he drinks alcohol. He reports that he does not use illicit drugs. Additional Social History: Pain Medications: none Prescriptions: ativan 10 mg  - x,s a day  prescribed for seizures in [place] 2014 Over the Counter: none History of alcohol / drug use?: Yes Longest period of sobriety (when/how long): 2 weeks Negative Consequences of Use: Personal relationships Withdrawal Symptoms: Seizures;Tremors;Sweats Onset of Seizures:  (Auguest  2014) Date of most recent seizure: Auguest 2014 Name of Substance 1: Alcohol 1 - Age of First Use: early 63s 1 - Amount (size/oz): 2 59's and a pint of Liquor 1 - Frequency: daily 1 - Duration: 4 yeary 1 - Last Use / Amount: 3/ 38 15  Current Place of Residence: [place], [place] of Birth:  [place], [place],   Family Members: "My 5 children"  Marital Status:  Single  Children:5  Sons: 2  Daughters: 3  Relationships: Single  Education:  [employer] Charity fundraiser Problems/Performance: Completed high school  Religious Beliefs/Practices: NA  History of Abuse (Emotional/Phsycial/Sexual):  Denies  Occupational Experiences: Employed  Nature conservation officer History:  None.  Legal History: Denies any pending legal charge  Hobbies/Interests: NA  Family History:   Family History  Problem Relation Age of Onset  . Diabetes Mother   . Hypertension Mother     Results for orders placed during the hospital encounter of 01/10/14 (from the past 72 hour(s))  ACETAMINOPHEN LEVEL     Status: None   Collection Time    01/10/14 11:33 PM      Result Value Ref Range   Acetaminophen (Tylenol), Serum <15.0  10 - 30 ug/mL   Comment:            THERAPEUTIC CONCENTRATIONS VARY     SIGNIFICANTLY. A RANGE OF 10-30     ug/mL MAY BE AN EFFECTIVE     CONCENTRATION FOR MANY PATIENTS.     HOWEVER, SOME ARE BEST TREATED     AT CONCENTRATIONS OUTSIDE THIS     RANGE.     ACETAMINOPHEN CONCENTRATIONS     >150 ug/mL AT 4 HOURS AFTER     INGESTION AND >50 ug/mL AT 12     HOURS AFTER INGESTION ARE     OFTEN ASSOCIATED WITH TOXIC     REACTIONS.  CBC  Status: Abnormal   Collection Time    01/10/14 11:33 PM      Result Value Ref Range   WBC 8.0  4.0 - 10.5 K/uL   RBC 5.00  4.22 - 5.81 MIL/uL   Hemoglobin 17.0  13.0 - 17.0 g/dL   HCT 46.3  39.0 - 52.0 %   MCV 92.6  78.0 - 100.0 fL   MCH 34.0  26.0 - 34.0 pg   MCHC 36.7 (*) 30.0 - 36.0 g/dL   RDW 13.4  11.5 - 15.5 %   Platelets 318  150 - 400 K/uL  COMPREHENSIVE METABOLIC PANEL     Status: Abnormal   Collection Time    01/10/14 11:33 PM      Result Value Ref Range   Sodium 141  137 - 147 mEq/L   Comment: REPEATED TO VERIFY   Potassium 4.4  3.7 - 5.3 mEq/L   Chloride 94 (*) 96 - 112 mEq/L   Comment: REPEATED TO VERIFY   CO2 23  19 - 32 mEq/L   Comment: REPEATED TO VERIFY   Glucose, Bld 103 (*) 70 - 99 mg/dL   BUN 9  6 - 23 mg/dL   Creatinine, Ser 0.82  0.50 - 1.35 mg/dL   Calcium 9.6  8.4 - 10.5 mg/dL   Total Protein 8.7 (*) 6.0 - 8.3 g/dL   Albumin 5.2  3.5 - 5.2 g/dL   AST 80 (*) 0 - 37 U/L   ALT 64 (*) 0 - 53 U/L   Alkaline Phosphatase  75  39 - 117 U/L   Total Bilirubin 0.5  0.3 - 1.2 mg/dL   GFR calc non Af Amer >90  >90 mL/min   GFR calc Af Amer >90  >90 mL/min   Comment: (NOTE)     The eGFR has been calculated using the CKD EPI equation.     This calculation has not been validated in all clinical situations.     eGFR's persistently <90 mL/min signify possible Chronic Kidney     Disease.  ETHANOL     Status: Abnormal   Collection Time    01/10/14 11:33 PM      Result Value Ref Range   Alcohol, Ethyl (B) 386 (*) 0 - 11 mg/dL   Comment:            LOWEST DETECTABLE LIMIT FOR     SERUM ALCOHOL IS 11 mg/dL     FOR MEDICAL PURPOSES ONLY  SALICYLATE LEVEL     Status: Abnormal   Collection Time    01/10/14 11:33 PM      Result Value Ref Range   Salicylate Lvl <7.1 (*) 2.8 - 20.0 mg/dL  URINE RAPID DRUG SCREEN (HOSP PERFORMED)     Status: None   Collection Time    01/11/14 12:29 AM      Result Value Ref Range   Opiates NONE DETECTED  NONE DETECTED   Cocaine NONE DETECTED  NONE DETECTED   Benzodiazepines NONE DETECTED  NONE DETECTED   Amphetamines NONE DETECTED  NONE DETECTED   Tetrahydrocannabinol NONE DETECTED  NONE DETECTED   Barbiturates NONE DETECTED  NONE DETECTED   Comment:            DRUG SCREEN FOR MEDICAL PURPOSES     ONLY.  IF CONFIRMATION IS NEEDED     FOR ANY PURPOSE, NOTIFY LAB     WITHIN 5 DAYS.  LOWEST DETECTABLE LIMITS     FOR URINE DRUG SCREEN     Drug Class       Cutoff (ng/mL)     Amphetamine      1000     Barbiturate      200     Benzodiazepine   473     Tricyclics       403     Opiates          300     Cocaine          300     THC              50  ETHANOL     Status: Abnormal   Collection Time    01/11/14  3:30 AM      Result Value Ref Range   Alcohol, Ethyl (B) 294 (*) 0 - 11 mg/dL   Comment:            LOWEST DETECTABLE LIMIT FOR     SERUM ALCOHOL IS 11 mg/dL     FOR MEDICAL PURPOSES ONLY  ETHANOL     Status: Abnormal   Collection Time    01/11/14  8:16 AM       Result Value Ref Range   Alcohol, Ethyl (B) 188 (*) 0 - 11 mg/dL   Comment:            LOWEST DETECTABLE LIMIT FOR     SERUM ALCOHOL IS 11 mg/dL     FOR MEDICAL PURPOSES ONLY   Psychological Evaluations:  Assessment:   DSM5: Schizophrenia Disorders:  NA Obsessive-Compulsive Disorders:  NA Trauma-Stressor Disorders:  NA Substance/Addictive Disorders:  Alcohol Related Disorder - Severe (303.90) Depressive Disorders:  NA  AXIS I:  Alcohol Related Disorder - Severe (303.90) AXIS II:  Deferred AXIS III:   Past Medical History  Diagnosis Date  . Alcohol abuse   . Seizure    AXIS IV:  Alcoholism, chronic AXIS V:  41-50 serious symptoms  Treatment Plan/Recommendations: 1. Admit for crisis management and stabilization, estimated length of stay 3-5 days.  2. Medication management to reduce current symptoms to base line and improve the patient's overall level of functioning; (a). Continue Librium detox.  3. Treat health problems as indicated.  4. Develop treatment plan to decrease risk of relapse upon discharge and the need for readmission.  5. Psycho-social education regarding relapse prevention and self care.  6. Health care follow up as needed for medical problems.  7. Review, reconcile, and reinstate any pertinent home medications for other health issues where appropriate. 8. Call for consults with hospitalist for any additional specialty patient care services as needed.  Treatment Plan Summary: Daily contact with patient to assess and evaluate symptoms and progress in treatment Medication management  Current Medications:  Current Facility-Administered Medications  Medication Dose Route Frequency Provider Last Rate Last Dose  . acetaminophen (TYLENOL) tablet 650 mg  650 mg Oral Q6H PRN Encarnacion Slates, NP      . alum & mag hydroxide-simeth (MAALOX/MYLANTA) 200-200-20 MG/5ML suspension 30 mL  30 mL Oral Q4H PRN Encarnacion Slates, NP      . chlordiazePOXIDE (LIBRIUM) capsule 25  mg  25 mg Oral Q6H PRN Encarnacion Slates, NP   25 mg at 01/11/14 1454  . chlordiazePOXIDE (LIBRIUM) capsule 25 mg  25 mg Oral QID Encarnacion Slates, NP   25 mg at 01/11/14 1621   Followed by  . [START ON 01/12/2014]  chlordiazePOXIDE (LIBRIUM) capsule 25 mg  25 mg Oral TID Encarnacion Slates, NP       Followed by  . [START ON 01/13/2014] chlordiazePOXIDE (LIBRIUM) capsule 25 mg  25 mg Oral BH-qamhs Encarnacion Slates, NP       Followed by  . [START ON 01/14/2014] chlordiazePOXIDE (LIBRIUM) capsule 25 mg  25 mg Oral Daily Encarnacion Slates, NP      . hydrOXYzine (ATARAX/VISTARIL) tablet 25 mg  25 mg Oral Q6H PRN Encarnacion Slates, NP      . loperamide (IMODIUM) capsule 2-4 mg  2-4 mg Oral PRN Encarnacion Slates, NP      . magnesium hydroxide (MILK OF MAGNESIA) suspension 30 mL  30 mL Oral Daily PRN Encarnacion Slates, NP      . multivitamin with minerals tablet 1 tablet  1 tablet Oral Daily Encarnacion Slates, NP   1 tablet at 01/11/14 1143  . [START ON 01/12/2014] nicotine (NICODERM CQ - dosed in mg/24 hr) patch 7 mg  7 mg Transdermal Q0600 Encarnacion Slates, NP      . ondansetron (ZOFRAN-ODT) disintegrating tablet 4 mg  4 mg Oral Q6H PRN Encarnacion Slates, NP      . thiamine (B-1) injection 100 mg  100 mg Intramuscular Once Encarnacion Slates, NP      . Derrill Memo ON 01/12/2014] thiamine (VITAMIN B-1) tablet 100 mg  100 mg Oral Daily Encarnacion Slates, NP      . traZODone (DESYREL) tablet 50 mg  50 mg Oral QHS PRN Encarnacion Slates, NP        Observation Level/Precautions:  15 minute checks  Laboratory:  Per ED  Psychotherapy:  Group sessions, AA/NA meetings  Medications: Continue Librium detox   Consultations: As needed    Discharge Concerns: Sobreity   Estimated LOS: 2-4 days  Other:     I certify that inpatient services furnished can reasonably be expected to improve the patient's condition.   Encarnacion Slates, PMHNP-BC 3/25/20154:29 PM  Patient was seen face-to-face for psychiatric evaluation, suicide risk assessment, this discussed with a physician  extender and formulated treatment plan. Reviewed the information documented and agree with the treatment plan.  Caliann Leckrone,JANARDHAHA R. 01/11/2014 6:50 PM

## 2014-01-11 NOTE — Progress Notes (Signed)
D Pt. Denies SI and HI, no complaints of pain only withdrawal symtoms.  A Writer offers support and encouragement.  R  Pt. Remains safe on the unit.   Received  Vistaril and trazodone for sleep, resting quietly

## 2014-01-11 NOTE — [facility] Group Notes (Signed)
[facility] LCSW Group Therapy  01/11/2014 2:49 PM  Type of Therapy:  Group Therapy  Participation Level:  Did Not Attend-pt arrived on unit this morning and is sleeping in room.   Smart, Lenore Moyano LCSWA  01/11/2014, 2:49 PM

## 2014-01-12 DIAGNOSIS — R569 Unspecified convulsions: Secondary | ICD-10-CM | POA: Insufficient documentation

## 2014-01-12 DIAGNOSIS — F1020 Alcohol dependence, uncomplicated: Secondary | ICD-10-CM

## 2014-01-12 DIAGNOSIS — R748 Abnormal levels of other serum enzymes: Secondary | ICD-10-CM | POA: Diagnosis present

## 2014-01-12 MED ORDER — NICOTINE 21 MG/24HR TD PT24
21.0000 mg | MEDICATED_PATCH | Freq: Every day | TRANSDERMAL | Status: DC
  Administered 2014-01-12 – 2014-01-13 (×2): 21 mg via TRANSDERMAL
  Filled 2014-01-12 (×4): qty 1

## 2014-01-12 NOTE — Progress Notes (Signed)
D: Patient denies SI/HI/AVH.  Pt. Appears anxious and possibly slightly paranoid.  He is open about his drinking and his triggers, such as his troubled marriages.  Pt. Reports that he is sleeping well and appetite and energy levels are improving.    A: Patient given emotional support from RN. Patient encouraged to come to staff with concerns and/or questions. Patient's medication routine continued. Patient's orders and plan of care reviewed.   R: Patient remains appropriate and cooperative. Will continue to monitor patient q15 minutes for safety.

## 2014-01-12 NOTE — Progress Notes (Signed)
Patient did attend the evening karaoke group. Pt was attentive and supportive.   

## 2014-01-12 NOTE — Progress Notes (Signed)
Patient ID: Dwayne IsaacsROBERT Mia, male   DOB: 1976-09-18, 38 y.o.   MRN: [id number]  Patient currently asleep; no s/s of distress noted at this time. Respirations regular and unlabored.

## 2014-01-12 NOTE — [facility] Group Notes (Signed)
[facility] LCSW Group Therapy  01/12/2014 3:02 PM  Type of Therapy:  Group Therapy  Participation Level:  Active  Participation Quality:  Attentive  Affect:  Appropriate  Cognitive:  Alert and Oriented  Insight:  Engaged  Engagement in Therapy:  Improving  Modes of Intervention:  Confrontation, Discussion, Education, Exploration, Problem-solving, Rapport Building, Socialization and Support  Summary of Progress/Problems: Group discussed balance in terms of different personality types such as passive, aggressive, and assertive. Molly MaduroRobert was attentive and engaged throughout today's therapy group. He shared that as a child he was passive but [place] on the streets was forced to become more aggressive or die. Molly MaduroRobert shows progress in the group setting and improving insight AEB his ability to process how developing the skills to turn his passive-aggressive nature into more assertiveness will help him deal with anger in his life, avoid emotional reactivity, and help him with being successful in recovery.   Smart, Coralee Edberg  LCSWA   01/12/2014, 3:02 PM

## 2014-01-12 NOTE — Progress Notes (Signed)
D Pt. Denies SI and HI, no complaints of pain or discomfort noted,.  A Writer offered support and encouragement.  Discussed discharge plans   R Pt. Remains safe on the unit.  States he may discharge tomorrow.  He will go to [employer] meetings and get back into working out to stay sober.  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  States he has 7 children and 3 ex wives.

## 2014-01-12 NOTE — [facility] Suicide Risk Assessment (Signed)
[facility] INPATIENT: Family/Significant Other Suicide Prevention Education  Suicide Prevention Education:  Education Completed; No one has been identified by the patient as the family member/significant other with whom the patient will be residing, and identified as the person(s) who will aid the patient in the event of a mental health crisis (suicidal ideations/suicide attempt).   Pt did not c/o SI at admission, nor have they endorsed SI during their stay here. SPE not required. SPI pamphlet provided to pt and he was encouraged to share information with support network, ask questions, and talk about any concerns relating to SPE.  The Sherwin-WilliamsHeather Smart, LCSWA 01/12/2014 12:56 PM

## 2014-01-12 NOTE — Progress Notes (Signed)
Recreation Therapy Notes  Animal-Assisted Activity/Therapy (AAA/T) Program Checklist/Progress Notes Patient Eligibility Criteria Checklist & Daily Group note for Rec Tx Intervention  Date: 03.26.2015 Time: 2:45pm [address]    AAA/T Program Assumption of Risk Form signed by Patient/ or Parent Legal Guardian yes  Patient is free of allergies or sever asthma yes  Patient reports no fear of animals yes  Patient reports no history of cruelty to animals yes   Patient understands his/her participation is voluntary yes  Patient washes hands before animal contact yes  Patient washes hands after animal contact yes  Behavioral Response: Appropriate   Education: Hand Washing, Appropriate Animal Interaction   Education Outcome: Acknowledges understanding   Clinical Observations/Feedback: Patient interacted appropriately with dog team, LRT and peers during session.   Marykay Lexenise L Danen Lapaglia, LRT/CTRS  Jearl KlinefelterBlanchfield, Ginnifer Creelman L 01/12/2014 4:41 PM

## 2014-01-12 NOTE — Progress Notes (Signed)
[facility] MD Progress Note  01/12/2014 3:27 PM Dwayne Bolton  MRN:  [id number] Subjective:  I feel much better today. Im eating well, sleeping well. Patient reports improvement in his withdrawal symptoms as well as his depression and anxiety. Diagnosis:   Schizophrenia Disorders: NA  Obsessive-Compulsive Disorders: NA  Trauma-Stressor Disorders: NA  Substance/Addictive Disorders: Alcohol Related Disorder - Severe (303.90)  Depressive Disorders: NA  AXIS I: Alcohol Related Disorder - Severe (303.90)  AXIS II: Deferred  AXIS III:  Past Medical History   Diagnosis  Date   .  Alcohol abuse    .  Seizure         Elevated liver enzymes                                                          01/12/2014 AXIS IV: Alcoholism, chronic  AXIS V: 41-50 serious symptoms Total Time spent with patient: 15 minutes    ADL's:  Intact  Sleep: Good  Appetite:  Good  Suicidal Ideation:  denies Homicidal Ideation:  denies AEB (as evidenced by):  Psychiatric Specialty Exam: Physical Exam  ROS  Blood pressure 132/95, pulse 103, temperature 97.8 F (36.6 C), resp. rate 18.There is no weight on file to calculate BMI.  General Appearance: Casual  Eye Contact::  Good  Speech:  Clear and Coherent  Volume:  Normal  Mood:  Depressed  Affect:  Congruent  Thought Process:  Goal Directed  Orientation:  Full (Time, Place, and Person)  Thought Content:  WDL  Suicidal Thoughts:  No  Homicidal Thoughts:  No  Memory:  NA  Judgement:  Intact  Insight:  Fair  Psychomotor Activity:  Normal  Concentration:  Fair  Recall:  [place] of Knowledge:Fair  Language: Good  Akathisia:  No  Handed:  Right  AIMS (if indicated):     Assets:  Communication Skills Desire for Improvement  Sleep:  Number of Hours: 6.25   Musculoskeletal: Strength & Muscle Tone: within normal limits Gait & Station: normal Patient leans: N/A  Current Medications: Current Facility-Administered Medications  Medication Dose  Route Frequency Provider Last Rate Last Dose  . acetaminophen (TYLENOL) tablet 650 mg  650 mg Oral Q6H PRN Encarnacion Slates, NP      . alum & mag hydroxide-simeth (MAALOX/MYLANTA) 200-200-20 MG/5ML suspension 30 mL  30 mL Oral Q4H PRN Encarnacion Slates, NP      . chlordiazePOXIDE (LIBRIUM) capsule 25 mg  25 mg Oral Q6H PRN Encarnacion Slates, NP   25 mg at 01/12/14 0636  . chlordiazePOXIDE (LIBRIUM) capsule 25 mg  25 mg Oral TID Encarnacion Slates, NP   25 mg at 01/12/14 1152   Followed by  . [START ON 01/13/2014] chlordiazePOXIDE (LIBRIUM) capsule 25 mg  25 mg Oral BH-qamhs Encarnacion Slates, NP       Followed by  . [START ON 01/14/2014] chlordiazePOXIDE (LIBRIUM) capsule 25 mg  25 mg Oral Daily Encarnacion Slates, NP      . hydrOXYzine (ATARAX/VISTARIL) tablet 25 mg  25 mg Oral Q6H PRN Encarnacion Slates, NP   25 mg at 01/11/14 1847  . loperamide (IMODIUM) capsule 2-4 mg  2-4 mg Oral PRN Encarnacion Slates, NP      . magnesium hydroxide (MILK OF MAGNESIA) suspension 30 mL  30 mL Oral Daily PRN Encarnacion Slates, NP      . multivitamin with minerals tablet 1 tablet  1 tablet Oral Daily Encarnacion Slates, NP   1 tablet at 01/12/14 0165  . nicotine (NICODERM CQ - dosed in mg/24 hr) patch 7 mg  7 mg Transdermal Q0600 Encarnacion Slates, NP   7 mg at 01/12/14 5374  . ondansetron (ZOFRAN-ODT) disintegrating tablet 4 mg  4 mg Oral Q6H PRN Encarnacion Slates, NP      . thiamine (B-1) injection 100 mg  100 mg Intramuscular Once Encarnacion Slates, NP      . thiamine (VITAMIN B-1) tablet 100 mg  100 mg Oral Daily Encarnacion Slates, NP   100 mg at 01/12/14 8270  . traZODone (DESYREL) tablet 50 mg  50 mg Oral QHS PRN Encarnacion Slates, NP   50 mg at 01/11/14 2245    Lab Results:  Results for orders placed during the hospital encounter of 01/10/14 (from the past 48 hour(s))  ACETAMINOPHEN LEVEL     Status: None   Collection Time    01/10/14 11:33 PM      Result Value Ref Range   Acetaminophen (Tylenol), Serum <15.0  10 - 30 ug/mL   Comment:            THERAPEUTIC  CONCENTRATIONS VARY     SIGNIFICANTLY. A RANGE OF 10-30     ug/mL MAY BE AN EFFECTIVE     CONCENTRATION FOR MANY PATIENTS.     HOWEVER, SOME ARE BEST TREATED     AT CONCENTRATIONS OUTSIDE THIS     RANGE.     ACETAMINOPHEN CONCENTRATIONS     >150 ug/mL AT 4 HOURS AFTER     INGESTION AND >50 ug/mL AT 12     HOURS AFTER INGESTION ARE     OFTEN ASSOCIATED WITH TOXIC     REACTIONS.  CBC     Status: Abnormal   Collection Time    01/10/14 11:33 PM      Result Value Ref Range   WBC 8.0  4.0 - 10.5 K/uL   RBC 5.00  4.22 - 5.81 MIL/uL   Hemoglobin 17.0  13.0 - 17.0 g/dL   HCT 46.3  39.0 - 52.0 %   MCV 92.6  78.0 - 100.0 fL   MCH 34.0  26.0 - 34.0 pg   MCHC 36.7 (*) 30.0 - 36.0 g/dL   RDW 13.4  11.5 - 15.5 %   Platelets 318  150 - 400 K/uL  COMPREHENSIVE METABOLIC PANEL     Status: Abnormal   Collection Time    01/10/14 11:33 PM      Result Value Ref Range   Sodium 141  137 - 147 mEq/L   Comment: REPEATED TO VERIFY   Potassium 4.4  3.7 - 5.3 mEq/L   Chloride 94 (*) 96 - 112 mEq/L   Comment: REPEATED TO VERIFY   CO2 23  19 - 32 mEq/L   Comment: REPEATED TO VERIFY   Glucose, Bld 103 (*) 70 - 99 mg/dL   BUN 9  6 - 23 mg/dL   Creatinine, Ser 0.82  0.50 - 1.35 mg/dL   Calcium 9.6  8.4 - 10.5 mg/dL   Total Protein 8.7 (*) 6.0 - 8.3 g/dL   Albumin 5.2  3.5 - 5.2 g/dL   AST 80 (*) 0 - 37 U/L   ALT 64 (*) 0 - 53 U/L   Alkaline Phosphatase 75  39 - 117 U/L   Total Bilirubin 0.5  0.3 - 1.2 mg/dL   GFR calc non Af Amer >90  >90 mL/min   GFR calc Af Amer >90  >90 mL/min   Comment: (NOTE)     The eGFR has been calculated using the CKD EPI equation.     This calculation has not been validated in all clinical situations.     eGFR's persistently <90 mL/min signify possible Chronic Kidney     Disease.  ETHANOL     Status: Abnormal   Collection Time    01/10/14 11:33 PM      Result Value Ref Range   Alcohol, Ethyl (B) 386 (*) 0 - 11 mg/dL   Comment:            LOWEST DETECTABLE LIMIT  FOR     SERUM ALCOHOL IS 11 mg/dL     FOR MEDICAL PURPOSES ONLY  SALICYLATE LEVEL     Status: Abnormal   Collection Time    01/10/14 11:33 PM      Result Value Ref Range   Salicylate Lvl <9.6 (*) 2.8 - 20.0 mg/dL  URINE RAPID DRUG SCREEN (HOSP PERFORMED)     Status: None   Collection Time    01/11/14 12:29 AM      Result Value Ref Range   Opiates NONE DETECTED  NONE DETECTED   Cocaine NONE DETECTED  NONE DETECTED   Benzodiazepines NONE DETECTED  NONE DETECTED   Amphetamines NONE DETECTED  NONE DETECTED   Tetrahydrocannabinol NONE DETECTED  NONE DETECTED   Barbiturates NONE DETECTED  NONE DETECTED   Comment:            DRUG SCREEN FOR MEDICAL PURPOSES     ONLY.  IF CONFIRMATION IS NEEDED     FOR ANY PURPOSE, NOTIFY LAB     WITHIN 5 DAYS.                LOWEST DETECTABLE LIMITS     FOR URINE DRUG SCREEN     Drug Class       Cutoff (ng/mL)     Amphetamine      1000     Barbiturate      200     Benzodiazepine   222     Tricyclics       979     Opiates          300     Cocaine          300     THC              50  ETHANOL     Status: Abnormal   Collection Time    01/11/14  3:30 AM      Result Value Ref Range   Alcohol, Ethyl (B) 294 (*) 0 - 11 mg/dL   Comment:            LOWEST DETECTABLE LIMIT FOR     SERUM ALCOHOL IS 11 mg/dL     FOR MEDICAL PURPOSES ONLY  ETHANOL     Status: Abnormal   Collection Time    01/11/14  8:16 AM      Result Value Ref Range   Alcohol, Ethyl (B) 188 (*) 0 - 11 mg/dL   Comment:            LOWEST DETECTABLE LIMIT FOR     SERUM ALCOHOL IS 11 mg/dL     FOR MEDICAL PURPOSES ONLY  Physical Findings: AIMS: Facial and Oral Movements Muscles of Facial Expression: None, normal Lips and Perioral Area: None, normal Jaw: None, normal Tongue: None, normal,Extremity Movements Upper (arms, wrists, hands, fingers): None, normal Lower (legs, knees, ankles, toes): None, normal, Trunk Movements Neck, shoulders, hips: None, normal, Overall  Severity Severity of abnormal movements (highest score from questions above): None, normal Incapacitation due to abnormal movements: None, normal Patient's awareness of abnormal movements (rate only patient's report): No Awareness, Dental Status Current problems with teeth and/or dentures?: No Does patient usually wear dentures?: No  CIWA:  CIWA-Ar Total: 5 COWS:     Treatment Plan Summary: Daily contact with patient to assess and evaluate symptoms and progress in treatment Medication management  Plan: 1. Continue crisis management and stabilization. 2. Medication management to reduce current symptoms to base line and improve the patient's overall level of functioning. 3. Treat health problems as indicated. 4. Develop treatment plan to decrease risk of relapse upon discharge and to reduce the need for readmission. 5. Psycho-social education regarding relapse prevention and self care. 6. Health care follow up as needed for medical problems. 7. Restart home medications where appropriate.  Medical Decision Making Problem Points:  Established problem, stable/improving (1) and New problem, with no additional work-up planned (3) Data Points:  Review or order medicine tests (1)  I certify that inpatient services furnished can reasonably be expected to improve the patient's condition.   Marlane Hatcher. [facility] 01/12/2014 3:34 PM  Reviewed the information documented and agree with the treatment plan.  Taresa [place],JANARDHAHA R. 01/12/2014 6:12 PM
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
## 2014-01-12 NOTE — Progress Notes (Signed)
0900 orientation group   The focus of this group is to educate the patient on the purpose and policies of crisis stabilization and provide a format to answer questions about their admission.  The group details unit policies and expectations of patients while admitted.   Pt did attend and was an active participant. He was appropriate and cooperative.

## 2014-01-13 DIAGNOSIS — F4323 Adjustment disorder with mixed anxiety and depressed mood: Secondary | ICD-10-CM | POA: Diagnosis present

## 2014-01-13 DIAGNOSIS — F411 Generalized anxiety disorder: Secondary | ICD-10-CM | POA: Diagnosis present

## 2014-01-13 MED ORDER — ALUM & MAG HYDROXIDE-SIMETH 200-200-20 MG/5ML PO SUSP: 30.0000 mL | ORAL | Status: DC | PRN

## 2014-01-13 MED ORDER — SERTRALINE HCL 50 MG PO TABS
ORAL_TABLET | ORAL | Status: AC
  Administered 2014-01-13: 11:00:00
  Filled 2014-01-13: qty 1

## 2014-01-13 MED ORDER — MAGNESIUM HYDROXIDE 400 MG/5ML PO SUSP: 30.0000 mL | Freq: Every day | ORAL | Status: DC | PRN

## 2014-01-13 MED ORDER — SERTRALINE HCL 50 MG PO TABS: 50.0000 mg | ORAL_TABLET | Freq: Every day | ORAL | Status: DC

## 2014-01-13 MED ORDER — SERTRALINE HCL 50 MG PO TABS
50.0000 mg | ORAL_TABLET | Freq: Every day | ORAL | Status: DC
  Administered 2014-01-13: 50 mg via ORAL
  Filled 2014-01-13 (×2): qty 1

## 2014-01-13 MED ORDER — ACETAMINOPHEN 325 MG PO TABS: 650.0000 mg | ORAL_TABLET | Freq: Four times a day (QID) | ORAL | Status: DC | PRN

## 2014-01-13 NOTE — Discharge Summary (Signed)
Physician Discharge Summary Note  Patient:  Dwayne Bolton is an 38 y.o., male MRN:  [id number] DOB:  August 18, 1976 Patient phone:  [phone] (home)  Patient address:   [place] [address],  Total Time spent with patient: 30 minutes  Date of Admission:  01/11/2014 Date of Discharge: 01/13/2014  Reason for Admission:  Alcohol withdrawals  Discharge Diagnoses: Active Problems:   Alcohol dependence   Seizures with withdrawal   Elevated liver enzymes   Psychiatric Specialty Exam: Physical Exam  ROS  Blood pressure 147/85, pulse 83, temperature 96.8 F (36 C), temperature source Oral, resp. rate 16.There is no weight on file to calculate BMI.  General Appearance: Casual  Eye Contact::  Good  Speech:  Clear and Coherent  Volume:  Normal  Mood:  Euthymic  Affect:  Congruent  Thought Process:  Goal Directed  Orientation:  Full (Time, Place, and Person)  Thought Content:  WDL  Suicidal Thoughts:  No  Homicidal Thoughts:  No  Memory:  NA  Judgement:  Intact  Insight:  Present  Psychomotor Activity:  Normal  Concentration:  Good  Recall:  [place] of Knowledge:Fair  Language: Good  Akathisia:  No  Handed:  Right  AIMS (if indicated):     Assets:  Communication Skills Desire for Improvement  Sleep:  Number of Hours: 6    Past Psychiatric History:  Diagnosis: Alcohol Related Disorder - Severe (303.90)   Hospitalizations: [facility] adult unit   Outpatient Care: None reported   Substance Abuse Care: None   Self-Mutilation: Denies   Suicidal Attempts: Denies   Violent Behaviors: Denies    Musculoskeletal: Strength & Muscle Tone: within normal limits Gait & Station: normal Patient leans: N/A   DSM5:  Schizophrenia Disorders: NA  Obsessive-Compulsive Disorders: NA  Trauma-Stressor Disorders: NA  Substance/Addictive Disorders: Alcohol Related Disorder - Severe (303.90)  Depressive Disorders: NA  AXIS I: Alcohol Related Disorder - Severe (303.90)   AXIS II: Deferred  AXIS III:  Past Medical History   Diagnosis  Date   .  Alcohol abuse    .  Seizure     AXIS IV: Alcoholism, chronic  AXIS V: 41-50 serious symptoms  Level of Care:  OP  Hospital Course:  Dwayne Bolton is a 38 year old male who was a walk in for [facility] requesting detox from alcohol and treatment for his withdrawal symptoms. He reported that he has a history of alcohol withdrawal symptoms including seizures. He reports drinking heavily for 2 years wit a daily use of a pint of Vodka, and 2 40 ounce beers. He reported his longest sobriety was 2 months this year.  He was sent for medical clearance at [facility].  His initial BAL was 386 and he was observed in the ED for 24 hours. His second BAL was down to 294, and then at 188 he was transferred to [facility] for further stabilization and treatment.       Dwayne Bolton was evaluated upon arrival at the unit and medication management was initiated with the Librium protocol. He was encouraged to participate in unit programming. Dwayne Bolton was monitored each day to assess his response to treatment and evaluate his withdrawal symptoms.        His CIWA responded accordingly and dropped as expected. He tolerated withdrawal well and had no complications. By the day of discharge he was in much improved condition and stated how much better he felt. He denied any further symptoms and was ready to return  home with plans to follow up as noted below. He felt that his time at [facility] had helped him a great deal and he was motivated to pursue his sobriety. Consults:  None  Significant Diagnostic Studies:  None  Discharge Vitals:   Blood pressure 147/85, pulse 83, temperature 96.8 F (36 C), temperature source Oral, resp. rate 16. There is no weight on file to calculate BMI. Lab Results:   Results for orders placed during the hospital encounter of 01/10/14 (from the past 72 hour(s))  ACETAMINOPHEN LEVEL     Status: None   Collection Time    01/10/14 11:33 PM      Result  Value Ref Range   Acetaminophen (Tylenol), Serum <15.0  10 - 30 ug/mL   Comment:            THERAPEUTIC CONCENTRATIONS VARY     SIGNIFICANTLY. A RANGE OF 10-30     ug/mL MAY BE AN EFFECTIVE     CONCENTRATION FOR MANY PATIENTS.     HOWEVER, SOME ARE BEST TREATED     AT CONCENTRATIONS OUTSIDE THIS     RANGE.     ACETAMINOPHEN CONCENTRATIONS     >150 ug/mL AT 4 HOURS AFTER     INGESTION AND >50 ug/mL AT 12     HOURS AFTER INGESTION ARE     OFTEN ASSOCIATED WITH TOXIC     REACTIONS.  CBC     Status: Abnormal   Collection Time    01/10/14 11:33 PM      Result Value Ref Range   WBC 8.0  4.0 - 10.5 K/uL   RBC 5.00  4.22 - 5.81 MIL/uL   Hemoglobin 17.0  13.0 - 17.0 g/dL   HCT 46.3  39.0 - 52.0 %   MCV 92.6  78.0 - 100.0 fL   MCH 34.0  26.0 - 34.0 pg   MCHC 36.7 (*) 30.0 - 36.0 g/dL   RDW 13.4  11.5 - 15.5 %   Platelets 318  150 - 400 K/uL  COMPREHENSIVE METABOLIC PANEL     Status: Abnormal   Collection Time    01/10/14 11:33 PM      Result Value Ref Range   Sodium 141  137 - 147 mEq/L   Comment: REPEATED TO VERIFY   Potassium 4.4  3.7 - 5.3 mEq/L   Chloride 94 (*) 96 - 112 mEq/L   Comment: REPEATED TO VERIFY   CO2 23  19 - 32 mEq/L   Comment: REPEATED TO VERIFY   Glucose, Bld 103 (*) 70 - 99 mg/dL   BUN 9  6 - 23 mg/dL   Creatinine, Ser 0.82  0.50 - 1.35 mg/dL   Calcium 9.6  8.4 - 10.5 mg/dL   Total Protein 8.7 (*) 6.0 - 8.3 g/dL   Albumin 5.2  3.5 - 5.2 g/dL   AST 80 (*) 0 - 37 U/L   ALT 64 (*) 0 - 53 U/L   Alkaline Phosphatase 75  39 - 117 U/L   Total Bilirubin 0.5  0.3 - 1.2 mg/dL   GFR calc non Af Amer >90  >90 mL/min   GFR calc Af Amer >90  >90 mL/min   Comment: (NOTE)     The eGFR has been calculated using the CKD EPI equation.     This calculation has not been validated in all clinical situations.     eGFR's persistently <90 mL/min signify possible Chronic Kidney     Disease.  ETHANOL  Status: Abnormal   Collection Time    01/10/14 11:33 PM      Result  Value Ref Range   Alcohol, Ethyl (B) 386 (*) 0 - 11 mg/dL   Comment:            LOWEST DETECTABLE LIMIT FOR     SERUM ALCOHOL IS 11 mg/dL     FOR MEDICAL PURPOSES ONLY  SALICYLATE LEVEL     Status: Abnormal   Collection Time    01/10/14 11:33 PM      Result Value Ref Range   Salicylate Lvl <4.6 (*) 2.8 - 20.0 mg/dL  URINE RAPID DRUG SCREEN (HOSP PERFORMED)     Status: None   Collection Time    01/11/14 12:29 AM      Result Value Ref Range   Opiates NONE DETECTED  NONE DETECTED   Cocaine NONE DETECTED  NONE DETECTED   Benzodiazepines NONE DETECTED  NONE DETECTED   Amphetamines NONE DETECTED  NONE DETECTED   Tetrahydrocannabinol NONE DETECTED  NONE DETECTED   Barbiturates NONE DETECTED  NONE DETECTED   Comment:            DRUG SCREEN FOR MEDICAL PURPOSES     ONLY.  IF CONFIRMATION IS NEEDED     FOR ANY PURPOSE, NOTIFY LAB     WITHIN 5 DAYS.                LOWEST DETECTABLE LIMITS     FOR URINE DRUG SCREEN     Drug Class       Cutoff (ng/mL)     Amphetamine      1000     Barbiturate      200     Benzodiazepine   803     Tricyclics       212     Opiates          300     Cocaine          300     THC              50  ETHANOL     Status: Abnormal   Collection Time    01/11/14  3:30 AM      Result Value Ref Range   Alcohol, Ethyl (B) 294 (*) 0 - 11 mg/dL   Comment:            LOWEST DETECTABLE LIMIT FOR     SERUM ALCOHOL IS 11 mg/dL     FOR MEDICAL PURPOSES ONLY  ETHANOL     Status: Abnormal   Collection Time    01/11/14  8:16 AM      Result Value Ref Range   Alcohol, Ethyl (B) 188 (*) 0 - 11 mg/dL   Comment:            LOWEST DETECTABLE LIMIT FOR     SERUM ALCOHOL IS 11 mg/dL     FOR MEDICAL PURPOSES ONLY    Physical Findings: AIMS: Facial and Oral Movements Muscles of Facial Expression: None, normal Lips and Perioral Area: None, normal Jaw: None, normal Tongue: None, normal,Extremity Movements Upper (arms, wrists, hands, fingers): None, normal Lower (legs,  knees, ankles, toes): None, normal, Trunk Movements Neck, shoulders, hips: None, normal, Overall Severity Severity of abnormal movements (highest score from questions above): None, normal Incapacitation due to abnormal movements: None, normal Patient's awareness of abnormal movements (rate only patient's report): No Awareness, Dental Status Current problems with teeth and/or dentures?:  No Does patient usually wear dentures?: No  CIWA:  CIWA-Ar Total: 5 COWS:     Psychiatric Specialty Exam: See Psychiatric Specialty Exam and Suicide Risk Assessment completed by Attending Physician prior to discharge.  Discharge destination:  Home  Is patient on multiple antipsychotic therapies at discharge:  No   Has Patient had three or more failed trials of antipsychotic monotherapy by history:  No  Recommended Plan for Multiple Antipsychotic Therapies: NA  Discharge Orders   Future Orders Complete By Expires   Diet - low sodium heart healthy  As directed    Discharge instructions  As directed    Comments:     Take all of your medications as directed. Be sure to keep all of your follow up appointments.  If you are unable to keep your follow up appointment, call your Doctor's office to let them know, and reschedule.  Make sure that you have enough medication to last until your appointment. Be sure to get plenty of rest. Going to bed at the same time each night will help. Try to avoid sleeping during the day.  Increase your activity as tolerated. Regular exercise will help you to sleep better and improve your mental health. Eating a heart healthy diet is recommended. Try to avoid salty or fried foods. Be sure to avoid all alcohol and illegal drugs.   Increase activity slowly  As directed        Medication List       Indication   sertraline 50 MG tablet  Commonly known as:  ZOLOFT  Take 1 tablet (50 mg total) by mouth daily. For depression and anxiety.   Indication:  Anxiety Disorder, Major  Depressive Disorder           Follow-up Information   Follow up with [facility]. (Walk in between 8am-9am Monday through Friday for hospital followup/medication management. )    Contact information:   [address] [place] [address] Phone: [phone] Fax: [phone]      Follow-up recommendations:   Activities: Resume activity as tolerated. Diet: Heart healthy low sodium diet Tests: Follow up testing will be determined by your out patient provider. Comments:    Total Discharge Time:  Less than 30 minutes.  Signed: Marlane Hatcher. [facility] 01/13/2014 10:16 AM Personally evaluated the patient and agree with assessment and plan Geralyn Flash A. Sabra Heck, M.D.

## 2014-01-13 NOTE — Tx Team (Signed)
Interdisciplinary Treatment Plan Update (Adult)  Date: 01/13/2014   Time Reviewed: 10:08 AM  Progress in Treatment:  Attending groups: Yes  Participating in groups:  Yes   Taking medication as prescribed: Yes  Tolerating medication: Yes  Family/Significant othe contact made: No. SPE not required for this pt.   Patient understands diagnosis: Yes, AEB seeking treatment for ETOH detox and mood stabilization.  Discussing patient identified problems/goals with staff: Yes  Medical problems stabilized or resolved: Yes  Denies suicidal/homicidal ideation: Yes during admission, group, and self report.  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 Pt [facility].  Additional comments: Dwayne MaduroRobert is 38 years old male. He reports, "I walked in here yesterday. [facility]. I just want to quit drinking. I have been drinking heavily x 2 years. I pint of Vodka + 2 bottles of the 40s daily. I don't think I'm an alcoholic, rather dependent on it. Alcohol keeps me calm. I shake and get anxiety when I am not drinking. I don't use or do drugs. My longest sobriety was from January thru early march of this year, 2 months I would say. I drink because of daily stressors. I'm not depressed, but I have anxiety". Right now, I have the tremors, I'm nervous. I have had seizures last August when coming off of alcohol. I'm on Lorazepam for my anxiety, prescribed. I'm not SIHI".  Reason for Continuation of Hospitalization: d/c today  Estimated length of stay: d/c today  For review of initial/current patient goals, please see plan of care.  Attendees:  Patient:    Family:    Physician: Geoffery LyonsIrving Lugo MD 01/13/2014 10:10 AM   Nursing: Thayer Ohmhris RN  01/13/2014 10:10 AM   Clinical Social Worker [employer] Sherwin-WilliamsHeather Smart, LCSWA  01/13/2014  10:10 AM   Other: Darden DatesJennifer C. Nurse CM  01/13/2014 10:10 AM   Other:    Other: Massie Kluverelores Sutton, Community Care Coordinator  01/13/2014 10:10 AM   Other:    Scribe for Treatment Team:  [employer]-WilliamsHeather Smart LCSWA  01/13/2014 10:10 AM

## 2014-01-13 NOTE — [facility] Suicide Risk Assessment (Addendum)
Suicide Risk Assessment  Discharge Assessment     Demographic Factors:  Male  Total Time spent with patient: 45 minutes  Psychiatric Specialty Exam:     Blood pressure 147/85, pulse 83, temperature 96.8 F (36 C), temperature source Oral, resp. rate 16.There is no weight on file to calculate BMI.  General Appearance: Fairly Groomed  Patent attorneyye Contact::  Fair  Speech:  Clear and Coherent  Volume:  Normal  Mood:  sad, worried  Affect:  Appropriate  Thought Process:  Coherent and Goal Directed  Orientation:  Full (Time, Place, and Person)  Thought Content:  worries, concerns, relapse prevention plsn  Suicidal Thoughts:  No  Homicidal Thoughts:  No  Memory:  Immediate;   Fair Recent;   Fair Remote;   Fair  Judgement:  Fair  Insight:  Present  Psychomotor Activity:  Normal  Concentration:  Fair  Recall:  Fis[employer]Fair  Fund of Knowledge:NA  Language: Fair  Akathisia:  No  Handed:    AIMS (if indicated):     Assets:  Desire for Improvement  Sleep:  Number of Hours: 6    Musculoskeletal: Strength & Muscle Tone: within normal limits Gait & Station: normal Patient leans: N/A   Mental Status Per Nursing Assessment::   On Admission:  NA  Current Mental Status by Physician: In full contact with reality. There are no active S/S of withdrawal. Wants to be back on Zoloft. Made aware of his his increased liver enzymes. He is motivated to quit drinking due to his children, job, health   Loss Factors: NA  Historical Factors: NA  Risk Reduction Factors:   Responsible for children under 36 years of age, Sense of responsibility to family and Employed  Continued Clinical Symptoms:  Depression:   Comorbid alcohol abuse/dependence Alcohol/Substance Abuse/Dependencies  Cognitive Features That Contribute To Risk:  Closed-mindedness Polarized thinking Thought constriction (tunnel vision)    Suicide Risk:  Minimal: No identifiable suicidal ideation.  Patients presenting with no risk  factors but with morbid ruminations; may be classified as minimal risk based on the severity of the depressive symptoms  Discharge Diagnoses:   AXIS I:  Alcohol Dependence, GAD, Adjustment Disorder with Depression AXIS II:  Deferred AXIS III:   Past Medical History  Diagnosis Date  . Alcohol abuse   . Seizure    AXIS IV:  other psychosocial or environmental problems AXIS V:  61-70 mild symptoms  Plan Of Care/Follow-up recommendations:  Activity:  as tolerated Diet:  regular Follow up [facility] Is patient on multiple antipsychotic therapies at discharge:  No   Has Patient had three or more failed trials of antipsychotic monotherapy by history:  No  Recommended Plan for Multiple Antipsychotic Therapies: NA    Linh Johannes A 01/13/2014, 10:25 AM

## 2014-01-13 NOTE — [facility] Group Notes (Signed)
[facility] Note   01/13/2014 9:56 AM  Participation Quality:  Appropriate   Mood/Affect:  Appropriate  Depression Rating:  0  Anxiety Rating:  3  Thoughts of Suicide:  No Will you contract for safety?   NA  Current AVH:  No  Plan for Discharge/Comments:  Pt reports that he is planning to return to work today. He reports no withdrawal symptoms. He plans to [facility]therapy.   Transportation Means: brother/bus   Supports: brother, sister in Social workerlaw and some family (mother in [place])  Smart, [place] LCSWA

## 2014-01-13 NOTE — Progress Notes (Signed)
Nrsg DC MD completed DC order, Dc SRA and DC note in pt's chart. zDCSW met with pt and all DC plans are confirmed. Per NP instruction, DC AVS reviewed with pt and he is able to verbalize his understanding of these instrucitos, ie " I know I gotta take my medicine every day and now I KNOW i gotta stay away from the drinking". Pt completes his morning self inventory and on it he wrote he denied having SI within the previous 24 hrs, he rated his depressionnand hopelessness "3/0" and he said his DC plan is " no drinking, back to work and go to [facility]". All belongings are returned to pt and pt signed release per policy, pt is given bus ticket via SW and then this nurse reviewed directions from [place] to bus stop ( In [place]).

## 2014-01-13 NOTE — Progress Notes (Signed)
Pt observed resting in bed with eyes closed. RR WNL. Level III obs in place for safety and pt is safe. Dwayne Bolton, Dwayne Bolton

## 2014-01-13 NOTE — Clinical Social Work Note (Addendum)
CSW received voicemail from pt's sister in law, Bishop DublinLinda Millhouse, requesting that CSW contact her regarding Molly MaduroRobert. CSW does not have permission to contact this person. Pt refused family contact [facility].   [employer] Sher[employer]-WilliamsHeather Smart, LCSWA 01/13/2014 2:52 PM   Pt and pt's brother in lobby. CSW spoke with pt and brother per pt's request about d/c plan. Pt stated that he plans to attend AA groups, MHA groups, and [facility] and [facility]. CSW provided pt with [facility] info just in case he decides to enter inpatient treatment.   [employer] Sherwin-WilliamsHeather Smart, LCSWA 01/13/2014 3:23 PM

## 2014-01-13 NOTE — Progress Notes (Signed)
[facility] Discharge Plan :  Will you be returning to the same living situation after discharge: Yes,  home with brother At discharge, do you have transportation home?:Yes,  brother or bus Do you have the ability to pay for your medications:Yes,  mental health  Release of information consent forms completed and submitted to Medical Records by CSW.  Patient to Follow up at: Follow-up Information   Follow up with [facility]. (Walk in between 8am-9am Monday through Friday for hospital followup/medication management. )    Contact information:   [address] [place] [place], [place] [address] Phone: [phone] Fax: [phone]      Patient denies SI/HI:   Yes,  during admission, group, and self report.     Safety Planning and Suicide Prevention discussed:  Yes,  SPE not required for this pt. SPI pamphlet provided to pt and he was encouraged to share information with support network, ask questions, and talk about any concerns relating to SPE.  Smart, Beryle Bagsby LCSWA  01/13/2014, 10:11 AM

## 2014-01-18 NOTE — Progress Notes (Signed)
Patient Discharge Instructions:  After Visit Summary (AVS):   Faxed to:  01/18/14 Discharge Summary Note:   Faxed to:  01/18/14 Psychiatric Admission Assessment Note:   Faxed to:  01/18/14 Suicide Risk Assessment - Discharge Assessment:   Faxed to:  01/18/14 Faxed/Sent to the Next Level Care provider:  01/18/14 [facility] to [facility] @ [phone]  Jerelene ReddenSheena E Cumberland, 01/18/2014, 3:23 PM

## 2014-09-09 ENCOUNTER — Emergency Department (HOSPITAL_COMMUNITY)
Admission: EM | Admit: 2014-09-09 | Discharge: 2014-09-10 | Disposition: A | Discharge status: Home / Self Care | Payer: No Typology Code available for payment source | Attending: Emergency Medicine | Admitting: Emergency Medicine

## 2014-09-09 ENCOUNTER — Encounter (HOSPITAL_COMMUNITY): Payer: Self-pay | Admitting: *Deleted

## 2014-09-09 DIAGNOSIS — Z79899 Other long term (current) drug therapy: Secondary | ICD-10-CM | POA: Insufficient documentation

## 2014-09-09 DIAGNOSIS — R569 Unspecified convulsions: Secondary | ICD-10-CM

## 2014-09-09 DIAGNOSIS — F1010 Alcohol abuse, uncomplicated: Secondary | ICD-10-CM

## 2014-09-09 DIAGNOSIS — R5383 Other fatigue: Secondary | ICD-10-CM | POA: Insufficient documentation

## 2014-09-09 DIAGNOSIS — Z720 Tobacco use: Secondary | ICD-10-CM | POA: Insufficient documentation

## 2014-09-09 DIAGNOSIS — F10229 Alcohol dependence with intoxication, unspecified: Secondary | ICD-10-CM | POA: Insufficient documentation

## 2014-09-09 LAB — BASIC METABOLIC PANEL
Anion gap: 19 — ABNORMAL HIGH (ref 5–15)
BUN: 7 mg/dL (ref 6–23)
CHLORIDE: 100 meq/L (ref 96–112)
CO2: 25 mEq/L (ref 19–32)
Calcium: 9.3 mg/dL (ref 8.4–10.5)
Creatinine, Ser: 0.88 mg/dL (ref 0.50–1.35)
GFR calc non Af Amer: >90 mL/min (ref >90)
Glucose, Bld: 73 mg/dL (ref 70–99)
POTASSIUM: 4.3 meq/L (ref 3.7–5.3)
Sodium: 144 mEq/L (ref 137–147)

## 2014-09-09 LAB — CBG MONITORING, ED: Glucose-Capillary: 71 mg/dL (ref 70–99)

## 2014-09-09 MED ORDER — LORAZEPAM 2 MG/ML IJ SOLN
1.0000 mg | Freq: Once | INTRAMUSCULAR | Status: AC
  Administered 2014-09-09: 1 mg via INTRAVENOUS
  Filled 2014-09-09: qty 1

## 2014-09-09 MED ORDER — SODIUM CHLORIDE 0.9 % IV BOLUS (SEPSIS)
500.0000 mL | Freq: Once | INTRAVENOUS | Status: AC
  Administered 2014-09-09: 500 mL via INTRAVENOUS

## 2014-09-09 NOTE — ED Notes (Signed)
[facility], here s/p sz activity. Pt found by bystander in [employer] who reported witnessed sz activity, "not sure how many or how long". Pt was postictal upon EMS arrival to scene and bystander had left. H/o same. Pt has not hadd sz meds in ~ 1 month d/t financial reasons. States, "every time I try and stop drinking I have a sz". H/o ETOH abuse and ETOH use today, "had a beer this am". Smells of ETOH. Pt A&Ox4. (Denies: injury, HA, neck or back pain, nvd, fever, sob or dizziness).  C/o bilateral general leg pain. NSR on monitor. CBG 95. IV in place.

## 2014-09-09 NOTE — ED Notes (Signed)
seizure pads in place.

## 2014-09-09 NOTE — ED Provider Notes (Signed)
CSN: [id number]     Arrival date & time 09/09/14  2023 History   First MD Initiated Contact with Patient 09/09/14 2035     Chief Complaint  Patient presents with  . Seizures  . Alcohol Intoxication     (Consider location/radiation/quality/duration/timing/severity/associated sxs/prior Treatment) HPI Comments:  38 year old male with history of alcohol dependence for proximally 5 years, smoker, seizures usually related to alcohol withdrawal, adjustment disorder presents after seizure activity. Patient felt the seizure coming on but does not recall the actual seizure activity. Patient has not been taking his medicines due to financial reasons. Patient has been on Librium and Ativan when necessary. Patient denies any specific seizure medication. Patient has mild body aches and fatigue but no fevers no neck stiffness. This is similar to previous. Patient last drank today. No significant head injury.  Patient is a 38 y.o. male presenting with seizures and intoxication. The history is provided by the patient.  Seizures Alcohol Intoxication Pertinent negatives include no chest pain, no abdominal pain, no headaches and no shortness of breath.    Past Medical History  Diagnosis Date  . Alcohol abuse   . Seizure    History reviewed. No pertinent past surgical history. Family History  Problem Relation Age of Onset  . Diabetes Mother   . Hypertension Mother    History  Substance Use Topics  . Smoking status: Current Every Day Smoker -- 0.25 packs/day for 4 years    Types: Cigarettes  . Smokeless tobacco: Current User  . Alcohol Use: Yes     Comment: 1 pt and 2-3  40s a day    Review of Systems  Constitutional: Positive for fatigue. Negative for fever and chills.  HENT: Negative for congestion.   Eyes: Negative for visual disturbance.  Respiratory: Negative for shortness of breath.   Cardiovascular: Negative for chest pain.  Gastrointestinal: Negative for vomiting and abdominal pain.   Genitourinary: Negative for dysuria and flank pain.  Musculoskeletal: Negative for back pain, neck pain and neck stiffness.  Skin: Negative for rash.  Neurological: Positive for seizures. Negative for light-headedness and headaches.      Allergies  Review of patient's allergies indicates no known allergies.  Home Medications   Prior to Admission medications   Medication Sig Start Date End Date Taking? Authorizing Provider  sertraline (ZOLOFT) 50 MG tablet Take 1 tablet (50 mg total) by mouth daily. For depression and anxiety. Patient not taking: Reported on 09/09/2014 01/13/14   Verne SpurrNeil Mashburn, PA-C   BP 110/76 mmHg  Pulse 82  Temp(Src) 99 F (37.2 C) (Oral)  Resp 18  SpO2 100% Physical Exam  Constitutional: He is oriented to person, place, and time. He appears well-developed and well-nourished.  HENT:  Head: Normocephalic and atraumatic.  Mild dry mucous membranes neck supple full range of motion  Eyes: Conjunctivae are normal. Right eye exhibits no discharge. Left eye exhibits no discharge.  Neck: Normal range of motion. Neck supple. No tracheal deviation present.  Cardiovascular: Normal rate and regular rhythm.   Pulmonary/Chest: Effort normal and breath sounds normal.  Abdominal: Soft. He exhibits no distension. There is no tenderness. There is no guarding.  Musculoskeletal: He exhibits no edema.  Neurological: He is alert and oriented to person, place, and time.  5+ strength in UE and LE with f/e at major joints. Sensation to palpation intact in UE and LE. CNs 2-12 grossly intact.  EOMFI.  PERRL.   Finger nose and coordination intact bilateral.   Visual fields intact  to finger testing. Mild clinical intoxication, alert and oriented, answers all questions appropriately.  Skin: Skin is warm. No rash noted.  Psychiatric: He has a normal mood and affect.  Nursing note and vitals reviewed.   ED Course  Procedures (including critical care time) Labs Review Labs  Reviewed  BASIC METABOLIC PANEL - Abnormal; Notable for the following:    Anion gap 19 (*)    All other components within normal limits  CBG MONITORING, ED    Imaging Review No results found.   EKG Interpretation None      MDM   Final diagnoses:  Seizure  Alcohol abuse   Patient with history of alcohol abuse and seizures presents of similar. Plan for close observation ER and recheck. Patient initially mild post ictal however this is improving with time. Ativan, IV fluids and monitor.  Patient observed in ER, no seizure activity, normal neuro exam. Patient ambulated without significant difficulty. Patient no longer clinically intoxicated, continued follow-up outpatient. Pt ambulated in ED.  No symptoms of significant withdrawal in the ER.  Results and differential diagnosis were discussed with the patient/parent/guardian. Close follow up outpatient was discussed, comfortable with the plan.   Medications  sodium chloride 0.9 % bolus 500 mL (500 mLs Intravenous New Bag/Given 09/09/14 2112)  LORazepam (ATIVAN) injection 1 mg (1 mg Intravenous Given 09/09/14 2112)    Filed Vitals:   09/09/14 2230 09/09/14 2245 09/09/14 2300 09/09/14 2315  BP: 130/90 130/87 103/81 110/76  Pulse: 78 78 84 82  Temp:      TempSrc:      Resp: 15 12 15 18   SpO2: 100% 100% 100% 100%    Final diagnoses:  Seizure  Alcohol abuse          Enid SkeensJoshua M Abreanna Drawdy, MD 09/10/14 [id number]

## 2014-09-09 NOTE — Discharge Instructions (Signed)
If you were given medicines take as directed.  If you are on coumadin or contraceptives realize their levels and effectiveness is altered by many different medicines.  If you have any reaction (rash, tongues swelling, other) to the medicines stop taking and see a physician.   Please follow up as directed and return to the ER or see a physician for new or worsening symptoms.  Thank you. Filed Vitals:   09/09/14 2230 09/09/14 2245 09/09/14 2300 09/09/14 2315  BP: 130/90 130/87 103/81 110/76  Pulse: 78 78 84 82  Temp:      TempSrc:      Resp: 15 12 15 18   SpO2: 100% 100% 100% 100%

## 2014-09-09 NOTE — ED Notes (Signed)
Patient awake after calling name. Gave patient water to drink and attempted to ambulate patient in room.  Patient verbalized understanding of drinking but currently unable to ambulate lethargic.  Instructed patient to drink water and will return to ambulate patient.

## 2014-09-10 NOTE — ED Notes (Addendum)
Patient ambulating in room to bathroom and returned to room steady gait. Alert answering and following commands appropriate. States pain 5/10 achy sore bilateral soreness.

## 2014-12-22 ENCOUNTER — Encounter (HOSPITAL_COMMUNITY): Payer: Self-pay | Admitting: Emergency Medicine

## 2014-12-22 ENCOUNTER — Emergency Department (HOSPITAL_COMMUNITY)
Admission: EM | Admit: 2014-12-22 | Discharge: 2014-12-23 | Disposition: A | Discharge status: Home / Self Care | Payer: Self-pay | Attending: Emergency Medicine | Admitting: Emergency Medicine

## 2014-12-22 ENCOUNTER — Emergency Department (HOSPITAL_COMMUNITY): Payer: Self-pay

## 2014-12-22 DIAGNOSIS — M7981 Nontraumatic hematoma of soft tissue: Secondary | ICD-10-CM | POA: Insufficient documentation

## 2014-12-22 DIAGNOSIS — F1010 Alcohol abuse, uncomplicated: Secondary | ICD-10-CM | POA: Insufficient documentation

## 2014-12-22 DIAGNOSIS — R569 Unspecified convulsions: Secondary | ICD-10-CM | POA: Insufficient documentation

## 2014-12-22 DIAGNOSIS — Z720 Tobacco use: Secondary | ICD-10-CM | POA: Insufficient documentation

## 2014-12-22 DIAGNOSIS — M545 Low back pain: Secondary | ICD-10-CM | POA: Insufficient documentation

## 2014-12-22 DIAGNOSIS — Z79899 Other long term (current) drug therapy: Secondary | ICD-10-CM | POA: Insufficient documentation

## 2014-12-22 LAB — CBC WITH DIFFERENTIAL/PLATELET
BASOS ABS: 0.1 10*3/uL (ref 0.0–0.1)
BASOS PCT: 1 % (ref 0–1)
EOS PCT: 0 % (ref 0–5)
Eosinophils Absolute: 0 10*3/uL (ref 0.0–0.7)
HCT: 41.7 % (ref 39.0–52.0)
Hemoglobin: 15 g/dL (ref 13.0–17.0)
Lymphocytes Relative: 17 % (ref 12–46)
Lymphs Abs: 2.4 10*3/uL (ref 0.7–4.0)
MCH: 33.3 pg (ref 26.0–34.0)
MCHC: 36 g/dL (ref 30.0–36.0)
MCV: 92.5 fL (ref 78.0–100.0)
MONO ABS: 0.9 10*3/uL (ref 0.1–1.0)
Monocytes Relative: 7 % (ref 3–12)
NEUTROS ABS: 10.5 10*3/uL — AB (ref 1.7–7.7)
Neutrophils Relative %: 75 % (ref 43–77)
Platelets: 265 10*3/uL (ref 150–400)
RBC: 4.51 MIL/uL (ref 4.22–5.81)
RDW: 13 % (ref 11.5–15.5)
WBC: 13.9 10*3/uL — ABNORMAL HIGH (ref 4.0–10.5)

## 2014-12-22 LAB — RAPID URINE DRUG SCREEN, HOSP PERFORMED
Amphetamines: NOT DETECTED
BENZODIAZEPINES: NOT DETECTED
Barbiturates: NOT DETECTED
Cocaine: NOT DETECTED
OPIATES: NOT DETECTED
Tetrahydrocannabinol: NOT DETECTED

## 2014-12-22 LAB — COMPREHENSIVE METABOLIC PANEL
ALBUMIN: 4.4 g/dL (ref 3.5–5.2)
ALT: 26 U/L (ref 0–53)
ANION GAP: 14 (ref 5–15)
AST: 40 U/L — ABNORMAL HIGH (ref 0–37)
Alkaline Phosphatase: 68 U/L (ref 39–117)
BILIRUBIN TOTAL: 0.7 mg/dL (ref 0.3–1.2)
BUN: 9 mg/dL (ref 6–23)
CO2: 24 mmol/L (ref 19–32)
Calcium: 8.9 mg/dL (ref 8.4–10.5)
Chloride: 99 mmol/L (ref 96–112)
Creatinine, Ser: 1.07 mg/dL (ref 0.50–1.35)
GFR calc Af Amer: >90 mL/min (ref >90)
GFR, EST NON AFRICAN AMERICAN: 88 mL/min — AB (ref >90)
Glucose, Bld: 128 mg/dL — ABNORMAL HIGH (ref 70–99)
Potassium: 3.6 mmol/L (ref 3.5–5.1)
Sodium: 137 mmol/L (ref 135–145)
Total Protein: 7 g/dL (ref 6.0–8.3)

## 2014-12-22 LAB — URINE MICROSCOPIC-ADD ON

## 2014-12-22 LAB — URINALYSIS, ROUTINE W REFLEX MICROSCOPIC
Bilirubin Urine: NEGATIVE
Glucose, UA: NEGATIVE mg/dL
Ketones, ur: NEGATIVE mg/dL
LEUKOCYTES UA: NEGATIVE
NITRITE: NEGATIVE
Protein, ur: NEGATIVE mg/dL
Specific Gravity, Urine: <1.005 — ABNORMAL LOW (ref 1.005–1.030)
UROBILINOGEN UA: 0.2 mg/dL (ref 0.0–1.0)
pH: 6 (ref 5.0–8.0)

## 2014-12-22 LAB — SALICYLATE LEVEL: Salicylate Lvl: <4 mg/dL (ref 2.8–20.0)

## 2014-12-22 LAB — ACETAMINOPHEN LEVEL: Acetaminophen (Tylenol), Serum: <10 ug/mL — ABNORMAL LOW (ref 10–30)

## 2014-12-22 LAB — ETHANOL: ALCOHOL ETHYL (B): 315 mg/dL — AB (ref 0–9)

## 2014-12-22 MED ORDER — LORAZEPAM 1 MG PO TABS: 0.0000 mg | ORAL_TABLET | Freq: Two times a day (BID) | ORAL | Status: DC

## 2014-12-22 MED ORDER — THIAMINE HCL 100 MG/ML IJ SOLN: 100.0000 mg | Freq: Every day | INTRAMUSCULAR | Status: DC

## 2014-12-22 MED ORDER — CHLORDIAZEPOXIDE HCL 25 MG PO CAPS: ORAL_CAPSULE | ORAL | Status: AC

## 2014-12-22 MED ORDER — LORAZEPAM 1 MG PO TABS: 0.0000 mg | ORAL_TABLET | Freq: Four times a day (QID) | ORAL | Status: DC

## 2014-12-22 MED ORDER — VITAMIN B-1 100 MG PO TABS
100.0000 mg | ORAL_TABLET | Freq: Every day | ORAL | Status: DC
  Administered 2014-12-22: 100 mg via ORAL
  Filled 2014-12-22: qty 1

## 2014-12-22 NOTE — [facility] Assessment (Signed)
[facility] Assessment Progress Note  Spoke with EDP Dr Romeo AppleHarrison and took history of pt, ED staff will take machine into the room.

## 2014-12-22 NOTE — ED Provider Notes (Signed)
CSN: [id number]     Arrival date & time 12/22/14  1653 History   First MD Initiated Contact with Patient 12/22/14 1654     Chief Complaint  Patient presents with  . Seizures  . Medical Clearance     (Consider location/radiation/quality/duration/timing/severity/associated sxs/prior Treatment) Patient is a 39 y.o. male presenting with seizures and mental health disorder. The history is provided by the patient.  Seizures Seizure activity on arrival: no   Seizure type:  Unable to specify Initial focality:  Unable to specify Return to baseline: yes   Duration: unknown. Timing:  Unable to specify Progression:  Resolved Mental Health Problem Presenting symptoms comment:  Alcohol dependance  Degree of incapacity (severity):  Moderate Onset quality:  Gradual Timing:  Constant Progression:  Worsening Chronicity:  Recurrent Context: alcohol use   Relieved by:  Nothing Worsened by:  Nothing tried Ineffective treatments:  None tried Associated symptoms: no abdominal pain, no chest pain and no headaches     Past Medical History  Diagnosis Date  . Alcohol abuse   . Seizure    No past surgical history on file. Family History  Problem Relation Age of Onset  . Diabetes Mother   . Hypertension Mother    History  Substance Use Topics  . Smoking status: Current Every Day Smoker -- 0.25 packs/day for 4 years    Types: Cigarettes  . Smokeless tobacco: Current User  . Alcohol Use: Yes     Comment: 1 pt and 2-3  40s a day    Review of Systems  Constitutional: Negative for fever.  HENT: Negative for drooling and rhinorrhea.   Eyes: Negative for pain.  Respiratory: Negative for cough and shortness of breath.   Cardiovascular: Negative for chest pain and leg swelling.  Gastrointestinal: Negative for nausea, vomiting, abdominal pain and diarrhea.  Genitourinary: Negative for dysuria and hematuria.  Musculoskeletal: Negative for gait problem and neck pain.  Skin: Negative for color  change.  Neurological: Positive for seizures. Negative for numbness and headaches.  Hematological: Negative for adenopathy.  Psychiatric/Behavioral: Negative for behavioral problems.  All other systems reviewed and are negative.     Allergies  Review of patient's allergies indicates no known allergies.  Home Medications   Prior to Admission medications   Medication Sig Start Date End Date Taking? Authorizing Provider  sertraline (ZOLOFT) 50 MG tablet Take 1 tablet (50 mg total) by mouth daily. For depression and anxiety. Patient not taking: Reported on 09/09/2014 01/13/14   Verne SpurrNeil Mashburn, PA-C   There were no vitals taken for this visit. Physical Exam  Constitutional: He is oriented to person, place, and time. He appears well-developed and well-nourished.  HENT:  Head: Normocephalic and atraumatic.  Right Ear: External ear normal.  Left Ear: External ear normal.  Nose: Nose normal.  Mouth/Throat: Oropharynx is clear and moist. No oropharyngeal exudate.  Eyes: Conjunctivae and EOM are normal. Pupils are equal, round, and reactive to light.  Neck: Normal range of motion. Neck supple.  Cardiovascular: Normal rate, regular rhythm, normal heart sounds and intact distal pulses.  Exam reveals no gallop and no friction rub.   No murmur heard. Pulmonary/Chest: Effort normal and breath sounds normal. No respiratory distress. He has no wheezes.  Abdominal: Soft. Bowel sounds are normal. He exhibits no distension. There is no tenderness. There is no rebound and no guarding.  Musculoskeletal: Normal range of motion. He exhibits no edema or tenderness.  Mild tenderness and bruising to the dorsal aspect of the right  hand.  Mild right lumbar paraspinal tenderness to palpation.  No vertebral tenderness to palpation noted.  Normal range of motion of the hips without pain.  Neurological: He is alert and oriented to person, place, and time.  alert, oriented x3 speech: normal in context and  clarity memory: intact grossly cranial nerves II-XII: intact motor strength: full proximally and distally no involuntary movements or tremors sensation: intact to light touch diffusely  cerebellar: finger-to-nose intact gait: normal forwards and backwards  Skin: Skin is warm and dry.  Psychiatric: He has a normal mood and affect. His behavior is normal.  Nursing note and vitals reviewed.   ED Course  Procedures (including critical care time) Labs Review Labs Reviewed  CBC WITH DIFFERENTIAL/PLATELET - Abnormal; Notable for the following:    WBC 13.9 (*)    Neutro Abs 10.5 (*)    All other components within normal limits  COMPREHENSIVE METABOLIC PANEL - Abnormal; Notable for the following:    Glucose, Bld 128 (*)    AST 40 (*)    GFR calc non Af Amer 88 (*)    All other components within normal limits  ETHANOL - Abnormal; Notable for the following:    Alcohol, Ethyl (B) 315 (*)    All other components within normal limits  URINALYSIS, ROUTINE W REFLEX MICROSCOPIC - Abnormal; Notable for the following:    Specific Gravity, Urine <1.005 (*)    Hgb urine dipstick SMALL (*)    All other components within normal limits  ACETAMINOPHEN LEVEL - Abnormal; Notable for the following:    Acetaminophen (Tylenol), Serum <10.0 (*)    All other components within normal limits  SALICYLATE LEVEL  URINE RAPID DRUG SCREEN (HOSP PERFORMED)  URINE MICROSCOPIC-ADD ON    Imaging Review Dg Hand Complete Right  12/22/2014   CLINICAL DATA:  Right hand pain and bruising after falling after seizure today. Initial encounter.  EXAM: RIGHT HAND - COMPLETE 3+ VIEW  COMPARISON:  None.  FINDINGS: The mineralization and alignment are normal. There is no evidence of acute fracture or dislocation. Possible old fracture of the fifth metacarpal. The soft tissues of the hand appear diffusely prominent. No foreign bodies identified.  IMPRESSION: No acute osseous findings.   Electronically Signed   By: Carey Bullocks  M.D.   On: 12/22/2014 18:02     EKG Interpretation   Date/Time:  Friday December 22 2014 18:53:10 EST Ventricular Rate:  89 PR Interval:  179 QRS Duration: 86 QT Interval:  370 QTC Calculation: 450 R Axis:   71 Text Interpretation:  Sinus rhythm Anteroseptal infarct, old Baseline  wander in lead(s) V2 No significant change since last tracing Confirmed by  Pamella Samons  MD, Ezequias Lard ([id number]) on 12/22/2014 7:06:29 PM      MDM   Final diagnoses:  Alcohol abuse    5:14 PM 40 y.o. male with a history of EtOH abuse and withdrawal seizures who presents with request for detox. He also states that he was walking along side of the road today several hours ago and believes he may of had a seizure. It was not witnessed. He is currently neurologically intact on my exam. He is afebrile and vital signs are unremarkable here. He is unsure whether he hit his head but denies any headache. He currently complains of some left-sided lumbar paraspinal pain and right hand pain. We'll get screening labs and imaging. He denies SI.   6:43 PM I interpreted/reviewed the labs and/or imaging which were non-contributory. Etoh elevated. I  discussed the case with the TTS worker. She states that behavior health is no longer taking detox patients without other psychiatric complaints. She believes that he will not be able to be placed if he had a seizure today. We discussed other options including admission to a medical bed versus monitoring in the ER overnight and being given resources by social worker in the morning. As the patient is at baseline and continues to appear well I think it will be reasonable to monitor him in the ER overnight for any recurrent seizures. If he continues to appear well then discharge once sober in the morning with resources would be appropriate. I went ahead and did his d/c paperwork and provided a Rx for Librium.   Purvis Sheffield, MD 12/23/14 Jacinta Shoe

## 2014-12-22 NOTE — Discharge Instructions (Signed)
Finding Treatment for Alcohol and Drug Addiction °It can be hard to find the right place to get professional treatment. Here are some important things to consider: °· There are different types of treatment to choose from. °· Some programs are live-in (residential) while others are not (outpatient). Sometimes a combination is offered. °· No single type of program is right for everyone. °· Most treatment programs involve a combination of education, counseling, and a 12-step, spiritually-based approach. °· There are non-spiritually based programs (not 12-step). °· Some treatment programs are government sponsored. They are geared for patients without private insurance. °· Treatment programs can vary in many respects such as: °¨ Cost and types of insurance accepted. °¨ Types of on-site medical services offered. °¨ Length of stay, setting, and size. °¨ Overall philosophy of treatment.  °A person may need specialized treatment or have needs not addressed by all programs. For example, adolescents need treatment appropriate for their age. Other people have secondary disorders that must be managed as well. Secondary conditions can include mental illness, such as depression or diabetes. Often, a period of detoxification from alcohol or drugs is needed. This requires medical supervision and not all programs offer this. °THINGS TO CONSIDER WHEN SELECTING A TREATMENT PROGRAM  °· Is the program certified by the appropriate government agency? Even private programs must be certified and employ certified professionals. °· Does the program accept your insurance? If not, can a payment plan be set up? °· Is the facility clean, organized, and well run? Do they allow you to speak with graduates who can share their treatment experience with you? Can you tour the facility? Can you meet with staff? °· Does the program meet the full range of individual needs? °· Does the treatment program address sexual orientation and physical disabilities?  Do they provide age, gender, and culturally appropriate treatment services? °· Is treatment available in languages other than English? °· Is long-term aftercare support or guidance encouraged and provided? °· Is assessment of an individual's treatment plan ongoing to ensure it meets changing needs? °· Does the program use strategies to encourage reluctant patients to remain in treatment long enough to increase the likelihood of success? °· Does the program offer counseling (individual or group) and other behavioral therapies? °· Does the program offer medicine as part of the treatment regimen, if needed? °· Is there ongoing monitoring of possible relapse? Is there a defined relapse prevention program? Are services or referrals offered to family members to ensure they understand addiction and the recovery process? This would help them support the recovering individual. °· Are 12-step meetings held at the center or is transport available for patients to attend outside meetings? °In countries outside of the [place], see local directories for contact information for services in your area. °Document Released: 09/04/2005 Document Revised: 12/29/2011 Document Reviewed: 03/16/2008 °[employer]® Patient Information ©2015 [employer]. This information is not intended to replace advice given to you by your health care provider. Make sure you discuss any questions you have with your health care provider. ° °Alcohol Withdrawal °Alcohol withdrawal happens when you normally drink alcohol a lot and suddenly stop drinking. Alcohol withdrawal symptoms can be mild to very bad. Mild withdrawal symptoms can include feeling sick to your stomach (nauseous), headache, or feeling irritable. Bad withdrawal symptoms can include shakiness, being very nervous (anxious), and not thinking clearly.  °HOME CARE °· Join an alcohol support group. °· Stay away from people or situations that make you want to drink. °· Eat a   healthy diet. Eat a  lot of fresh fruits, vegetables, and lean meats. °GET HELP RIGHT AWAY IF:  °· You become confused. You start to see and hear things that are not really there. °· You feel your heart beating very fast. °· You throw up (vomit) blood or cannot stop throwing up. This may be bright red or look like black coffee grounds. °· You have blood in your poop (stool). This may be bright red, maroon colored, or black and tarry. °· You are lightheaded or pass out (faint). °· You develop a fever. °MAKE SURE YOU:  °· Understand these instructions. °· Will watch your condition. °· Will get help right away if you are not doing well or get worse. °Document Released: 03/24/2008 Document Revised: 12/29/2011 Document Reviewed: 03/24/2008 °[employer]® Patient Information ©2015 [employer]. This information is not intended to replace advice given to you by your health care provider. Make sure you discuss any questions you have with your health care provider. ° °

## 2014-12-22 NOTE — ED Notes (Signed)
[facility] and detox from alcohol. Pt stated he was walking down the street and looked up and hd what he thinks is a seizure. Pt then walked to outpatient surgical center and they called EMS. Hx of seizures, out of medication. Pt recently discharged from rehab facility. Pt c/o right hand pain, right eye lid swelling.

## 2014-12-22 NOTE — BH Assessment (Addendum)
Tele Assessment Note   Dwayne Bolton is an 39 y.o. male who came to the hospital requesting detox from alcohol. He says he drinks 3 40's of beer daily. Pt denies depression, SI, HI, A/V hallucinations, or any other SA other than alcohol.  Pt is currently homeless with no family supports.  Pt denies any history of suicide attempts in the past, and he says his mom was a drinker.  Pt said that he had a seizure today before he got to the hospital. "I could feel it coming on, so I drank a 40, but I still had a seizure and blacked out".    Pt was calm and cooperative during assessment.  His appearance was disheveled, and his movement was normal, speech slow, mood and affect congruent and appropriate to circumstance. There was no evidence he was responding to internal stimuli.  Spoke with Assunta FoundShuvon Rankin, NP, who recommends detox in the ED or on the medical floor since [facility] no longer accepts straight SA patients. [facility] and [facility] do not typically accept patients with active seizures.  Spoke with Dr. Romeo AppleHarrison and explained situation, and he agrees to keep pt in ED overnight with the CIWA protocol, and give OP resources in am when pt is stable to discharge.  Axis I: Alcohol Abuse Axis II: Deferred Axis III:  Past Medical History  Diagnosis Date  . Alcohol abuse   . Seizure    Axis IV: economic problems, housing problems and problems with primary support group Axis V: 41-50 serious symptoms  Past Medical History:  Past Medical History  Diagnosis Date  . Alcohol abuse   . Seizure     History reviewed. No pertinent past surgical history.  Family History:  Family History  Problem Relation Age of Onset  . Diabetes Mother   . Hypertension Mother     Social History:  reports that he has been smoking Cigarettes.  He has a 1 pack-year smoking history. He uses smokeless tobacco. He reports that he drinks alcohol. He reports that he does not use illicit drugs.  Additional Social History:  Alcohol / Drug  Use Pain Medications: denies Prescriptions: denies Over the Counter: denies History of alcohol / drug use?: Yes Longest period of sobriety (when/how long): a few months Negative Consequences of Use: Financial, Personal relationships, Work / School Substance #1 Name of Substance 1: alcohol 1 - Age of First Use: 18 1 - Amount (size/oz): 2-3 40 oz beers 1 - Frequency: daily 1 - Duration: months 1 - Last Use / Amount: 1 40 oz  CIWA: CIWA-Ar BP: 135/92 mmHg Pulse Rate: 104 Nausea and Vomiting: mild nausea with no vomiting Tactile Disturbances: none Tremor: no tremor Auditory Disturbances: not present Paroxysmal Sweats: no sweat visible Visual Disturbances: not present Anxiety: no anxiety, at ease Headache, Fullness in Head: none present Agitation: normal activity Orientation and Clouding of Sensorium: oriented and can do serial additions CIWA-Ar Total: 1 COWS:    PATIENT STRENGTHS: (choose at least two) Ability for insight Average or above average intelligence Capable of independent living Communication skills  Allergies: No Known Allergies  Home Medications:  (Not in a hospital admission)  OB/GYN Status:  No LMP for male patient.  General Assessment Data [facility] Is this a Tele or Face-to-Face Assessment?: Tele Assessment Is this an Initial Assessment or a Re-assessment for this encounter?: Initial Assessment Living Arrangements:  (homeless) Can pt return to current living arrangement?: Yes Admission Status: Voluntary Is patient capable of signing voluntary  admission?: Yes Transfer from: Home Referral Source: Self/Family/Friend     [facility] Crisis Care Plan Living Arrangements:  (homeless) Name of Psychiatrist:  (none) Name of Therapist:  (none)  Education Status Is patient currently in school?: No  Risk to self with the past 6 months Suicidal Ideation: No Suicidal Intent: No Is patient at risk for suicide?: No Suicidal Plan?:  No Access to Means: No What has been your use of drugs/alcohol within the last 12 months?: See SA section Previous Attempts/Gestures: No Intentional Self Injurious Behavior: None Family Suicide History: No Recent stressful life event(s): Financial Problems (homeless) Persecutory voices/beliefs?: No Depression: No Depression Symptoms:  (denies) Substance abuse history and/or treatment for substance abuse?: Yes  Risk to Others within the past 6 months Homicidal Ideation: No Thoughts of Harm to Others: No Current Homicidal Intent: No Current Homicidal Plan: No Access to Homicidal Means: No History of harm to others?: No Assessment of Violence: None Noted Does patient have access to weapons?: No Criminal Charges Pending?: No Does patient have a court date: No  Psychosis Hallucinations: None noted Delusions: None noted  Mental Status Report Appear/Hygiene: Disheveled Eye Contact: Good Motor Activity: Unremarkable Speech: Logical/coherent Level of Consciousness: Alert Mood: Depressed Affect: Appropriate to circumstance Anxiety Level: Minimal Thought Processes: Coherent, Relevant Judgement: Partial Orientation: Person, Place, Time, Situation, Appropriate for developmental age Obsessive Compulsive Thoughts/Behaviors: Minimal  Cognitive Functioning Concentration: Normal Memory: Recent Intact, Remote Intact IQ: Average Insight: Fair Impulse Control: Fair Appetite: Good Weight Loss: 0 Weight Gain: 0 Sleep: No Change Total Hours of Sleep: 8 Vegetative Symptoms: Decreased grooming  ADLScreening [facility]) Patient's cognitive ability adequate to safely complete daily activities?: Yes Patient able to express need for assistance with ADLs?: Yes Independently performs ADLs?: Yes (appropriate for developmental age)  Prior Inpatient Therapy Prior Inpatient Therapy: Yes Prior Therapy Dates:  (2015) Prior Therapy Facilty/Provider(s):  ([facility])  Prior  Outpatient Therapy Prior Outpatient Therapy: Yes Prior Therapy Dates:  (ongoing) Prior Therapy Facilty/Provider(s):  ([facility]) Reason for Treatment:  (ETOH)  ADL Screening (condition at time of admission) Patient's cognitive ability adequate to safely complete daily activities?: Yes Is the patient deaf or have difficulty hearing?: No Does the patient have difficulty seeing, even when wearing glasses/contacts?: No Does the patient have difficulty concentrating, remembering, or making decisions?: No Patient able to express need for assistance with ADLs?: Yes Does the patient have difficulty dressing or bathing?: No Independently performs ADLs?: Yes (appropriate for developmental age) Does the patient have difficulty walking or climbing stairs?: No Weakness of Legs: None Weakness of Arms/Hands: None  Home Assistive Devices/Equipment Home Assistive Devices/Equipment: None    Abuse/Neglect Assessment (Assessment to be complete while patient is alone) Physical Abuse: Denies Verbal Abuse: Denies Sexual Abuse: Denies Exploitation of patient/patient's resources: Denies Self-Neglect: Denies Values / Beliefs Cultural Requests During Hospitalization: None Spiritual Requests During Hospitalization: None   Advance Directives (For Healthcare) Does patient have an advance directive?: No Would patient like information on creating an advanced directive?: No - patient declined information    Additional Information 1:1 In Past 12 Months?: No CIRT Risk: No Elopement Risk: No Does patient have medical clearance?: Yes     Disposition:  Disposition Initial Assessment Completed for this Encounter: Yes Disposition of Patient: Inpatient treatment program Type of inpatient treatment program: Adult  [facility] 12/22/2014 6:19 PM

## 2014-12-22 NOTE — ED Notes (Signed)
Patient placed in Paper scrubs and waunded

## 2014-12-22 NOTE — BH Assessment (Signed)
[facility] Assessment Progress Note Rn Denny Peonrin talk Clinical research associatewriter that pt is in Xray at this time and unable to be assessed.  She estimates 30 minutes until he is ready, and [facility] only has one machine at this time.

## 2014-12-22 NOTE — ED Notes (Signed)
Meal Tray ordered.  

## 2015-01-14 NOTE — Progress Notes (Signed)
ED CM received call from [facility] concerning a prescription for librium patient received 3/4 and is now attempting to fill. ED CM confirmed that prescription is more than 473 weeks old. Patient will need to f/u with PCP. No further questions or concerns.

## 2015-03-22 ENCOUNTER — Encounter (HOSPITAL_COMMUNITY): Payer: Self-pay | Admitting: Emergency Medicine

## 2015-03-22 ENCOUNTER — Encounter (HOSPITAL_COMMUNITY): Payer: Self-pay

## 2015-03-22 ENCOUNTER — Emergency Department (HOSPITAL_COMMUNITY): Payer: Self-pay

## 2015-03-22 ENCOUNTER — Inpatient Hospital Stay (HOSPITAL_COMMUNITY)
Admission: EM | Admit: 2015-03-22 | Discharge: 2015-03-25 | DRG: 897 | Disposition: A | Discharge status: Home / Self Care | Payer: Self-pay | Attending: Internal Medicine | Admitting: Internal Medicine

## 2015-03-22 ENCOUNTER — Emergency Department (HOSPITAL_COMMUNITY)
Admission: EM | Admit: 2015-03-22 | Discharge: 2015-03-22 | Disposition: A | Discharge status: Home / Self Care | Payer: Self-pay | Attending: Emergency Medicine | Admitting: Emergency Medicine

## 2015-03-22 DIAGNOSIS — F10239 Alcohol dependence with withdrawal, unspecified: Principal | ICD-10-CM

## 2015-03-22 DIAGNOSIS — F1020 Alcohol dependence, uncomplicated: Secondary | ICD-10-CM | POA: Diagnosis present

## 2015-03-22 DIAGNOSIS — F329 Major depressive disorder, single episode, unspecified: Secondary | ICD-10-CM | POA: Diagnosis present

## 2015-03-22 DIAGNOSIS — F19930 Other psychoactive substance use, unspecified with withdrawal, uncomplicated: Secondary | ICD-10-CM

## 2015-03-22 DIAGNOSIS — Z8249 Family history of ischemic heart disease and other diseases of the circulatory system: Secondary | ICD-10-CM

## 2015-03-22 DIAGNOSIS — F17210 Nicotine dependence, cigarettes, uncomplicated: Secondary | ICD-10-CM | POA: Diagnosis present

## 2015-03-22 DIAGNOSIS — E871 Hypo-osmolality and hyponatremia: Secondary | ICD-10-CM | POA: Diagnosis present

## 2015-03-22 DIAGNOSIS — Z720 Tobacco use: Secondary | ICD-10-CM | POA: Insufficient documentation

## 2015-03-22 DIAGNOSIS — F10939 Alcohol use, unspecified with withdrawal, unspecified: Secondary | ICD-10-CM

## 2015-03-22 DIAGNOSIS — R569 Unspecified convulsions: Secondary | ICD-10-CM

## 2015-03-22 DIAGNOSIS — R740 Nonspecific elevation of levels of transaminase and lactic acid dehydrogenase [LDH]: Secondary | ICD-10-CM | POA: Diagnosis present

## 2015-03-22 DIAGNOSIS — E876 Hypokalemia: Secondary | ICD-10-CM | POA: Diagnosis present

## 2015-03-22 DIAGNOSIS — F19230 Other psychoactive substance dependence with withdrawal, uncomplicated: Secondary | ICD-10-CM

## 2015-03-22 DIAGNOSIS — Z833 Family history of diabetes mellitus: Secondary | ICD-10-CM

## 2015-03-22 LAB — COMPREHENSIVE METABOLIC PANEL
ALBUMIN: 4.3 g/dL (ref 3.5–5.0)
ALT: 78 U/L — AB (ref 17–63)
AST: 102 U/L — AB (ref 15–41)
Alkaline Phosphatase: 104 U/L (ref 38–126)
Anion gap: 14 (ref 5–15)
BILIRUBIN TOTAL: 0.7 mg/dL (ref 0.3–1.2)
BUN: 12 mg/dL (ref 6–20)
CALCIUM: 9.4 mg/dL (ref 8.9–10.3)
CHLORIDE: 97 mmol/L — AB (ref 101–111)
CO2: 23 mmol/L (ref 22–32)
CREATININE: 0.87 mg/dL (ref 0.61–1.24)
GFR calc Af Amer: >60 mL/min (ref >60)
GFR calc non Af Amer: >60 mL/min (ref >60)
Glucose, Bld: 92 mg/dL (ref 65–99)
POTASSIUM: 3.7 mmol/L (ref 3.5–5.1)
SODIUM: 134 mmol/L — AB (ref 135–145)
Total Protein: 7.5 g/dL (ref 6.5–8.1)

## 2015-03-22 LAB — CBC WITH DIFFERENTIAL/PLATELET
BASOS ABS: 0.1 10*3/uL (ref 0.0–0.1)
Basophils Relative: 1 % (ref 0–1)
EOS PCT: 1 % (ref 0–5)
Eosinophils Absolute: 0 10*3/uL (ref 0.0–0.7)
HCT: 37 % — ABNORMAL LOW (ref 39.0–52.0)
Hemoglobin: 13 g/dL (ref 13.0–17.0)
LYMPHS PCT: 9 % — AB (ref 12–46)
Lymphs Abs: 0.7 10*3/uL (ref 0.7–4.0)
MCH: 32.9 pg (ref 26.0–34.0)
MCHC: 35.1 g/dL (ref 30.0–36.0)
MCV: 93.7 fL (ref 78.0–100.0)
Monocytes Absolute: 1.7 10*3/uL — ABNORMAL HIGH (ref 0.1–1.0)
Monocytes Relative: 21 % — ABNORMAL HIGH (ref 3–12)
Neutro Abs: 5.5 10*3/uL (ref 1.7–7.7)
Neutrophils Relative %: 68 % (ref 43–77)
Platelets: 155 10*3/uL (ref 150–400)
RBC: 3.95 MIL/uL — AB (ref 4.22–5.81)
RDW: 14.1 % (ref 11.5–15.5)
WBC: 8 10*3/uL (ref 4.0–10.5)

## 2015-03-22 LAB — URINALYSIS, ROUTINE W REFLEX MICROSCOPIC
Bilirubin Urine: NEGATIVE
Glucose, UA: NEGATIVE mg/dL
KETONES UR: NEGATIVE mg/dL
Leukocytes, UA: NEGATIVE
Nitrite: NEGATIVE
PH: 6 (ref 5.0–8.0)
PROTEIN: 100 mg/dL — AB
Specific Gravity, Urine: 1.024 (ref 1.005–1.030)
Urobilinogen, UA: 0.2 mg/dL (ref 0.0–1.0)

## 2015-03-22 LAB — URINE MICROSCOPIC-ADD ON

## 2015-03-22 LAB — RAPID URINE DRUG SCREEN, HOSP PERFORMED
AMPHETAMINES: NOT DETECTED
Barbiturates: NOT DETECTED
Benzodiazepines: NOT DETECTED
Cocaine: NOT DETECTED
Opiates: NOT DETECTED
TETRAHYDROCANNABINOL: NOT DETECTED

## 2015-03-22 LAB — CBC
HCT: 35.9 % — ABNORMAL LOW (ref 39.0–52.0)
Hemoglobin: 12.5 g/dL — ABNORMAL LOW (ref 13.0–17.0)
MCH: 33.2 pg (ref 26.0–34.0)
MCHC: 34.8 g/dL (ref 30.0–36.0)
MCV: 95.2 fL (ref 78.0–100.0)
PLATELETS: 169 10*3/uL (ref 150–400)
RBC: 3.77 MIL/uL — ABNORMAL LOW (ref 4.22–5.81)
RDW: 14.4 % (ref 11.5–15.5)
WBC: 7.9 10*3/uL (ref 4.0–10.5)

## 2015-03-22 LAB — CREATININE, SERUM
Creatinine, Ser: 0.84 mg/dL (ref 0.61–1.24)
GFR calc Af Amer: >60 mL/min (ref >60)

## 2015-03-22 LAB — CBG MONITORING, ED
Glucose-Capillary: 86 mg/dL (ref 65–99)
Glucose-Capillary: 142 mg/dL — ABNORMAL HIGH (ref 65–99)

## 2015-03-22 LAB — MRSA PCR SCREENING: MRSA BY PCR: NEGATIVE

## 2015-03-22 LAB — ETHANOL: ALCOHOL ETHYL (B): 37 mg/dL — AB (ref <5)

## 2015-03-22 LAB — TSH: TSH: 3.901 u[IU]/mL (ref 0.350–4.500)

## 2015-03-22 MED ORDER — ONDANSETRON HCL 4 MG/2ML IJ SOLN: 4.0000 mg | Freq: Four times a day (QID) | INTRAMUSCULAR | Status: DC | PRN

## 2015-03-22 MED ORDER — ONE-A-DAY MENS PO TABS: 1.0000 | ORAL_TABLET | Freq: Every day | ORAL | Status: DC

## 2015-03-22 MED ORDER — VITAMIN B-1 100 MG PO TABS: 100.0000 mg | ORAL_TABLET | Freq: Every day | ORAL | Status: DC

## 2015-03-22 MED ORDER — VITAMIN B-1 100 MG PO TABS
100.0000 mg | ORAL_TABLET | Freq: Every day | ORAL | Status: DC
  Administered 2015-03-22 – 2015-03-25 (×4): 100 mg via ORAL
  Filled 2015-03-22 (×4): qty 1

## 2015-03-22 MED ORDER — SODIUM CHLORIDE 0.9 % IV BOLUS (SEPSIS)
1000.0000 mL | Freq: Once | INTRAVENOUS | Status: AC
  Administered 2015-03-22: 1000 mL via INTRAVENOUS

## 2015-03-22 MED ORDER — THIAMINE HCL 100 MG/ML IJ SOLN
Freq: Once | INTRAVENOUS | Status: AC
  Administered 2015-03-22: 23:00:00 via INTRAVENOUS
  Filled 2015-03-22: qty 1000

## 2015-03-22 MED ORDER — ONDANSETRON HCL 4 MG PO TABS: 4.0000 mg | ORAL_TABLET | Freq: Four times a day (QID) | ORAL | Status: DC | PRN

## 2015-03-22 MED ORDER — LORAZEPAM 2 MG/ML IJ SOLN: 0.0000 mg | Freq: Four times a day (QID) | INTRAMUSCULAR | Status: DC

## 2015-03-22 MED ORDER — FOLIC ACID 1 MG PO TABS: 1.0000 mg | ORAL_TABLET | Freq: Every day | ORAL | Status: DC

## 2015-03-22 MED ORDER — LORAZEPAM 2 MG/ML IJ SOLN
2.0000 mg | Freq: Once | INTRAMUSCULAR | Status: AC
  Administered 2015-03-22: 2 mg via INTRAVENOUS
  Filled 2015-03-22: qty 1

## 2015-03-22 MED ORDER — THIAMINE HCL 100 MG/ML IJ SOLN: 100.0000 mg | Freq: Every day | INTRAMUSCULAR | Status: DC

## 2015-03-22 MED ORDER — LORAZEPAM 2 MG/ML IJ SOLN: 1.0000 mg | Freq: Once | INTRAMUSCULAR | Status: DC

## 2015-03-22 MED ORDER — CHLORDIAZEPOXIDE HCL 25 MG PO CAPS: ORAL_CAPSULE | ORAL | Status: DC

## 2015-03-22 MED ORDER — LORAZEPAM 2 MG/ML IJ SOLN
1.0000 mg | Freq: Once | INTRAMUSCULAR | Status: AC
  Administered 2015-03-22: 1 mg via INTRAVENOUS
  Filled 2015-03-22: qty 1

## 2015-03-22 MED ORDER — HEPARIN SODIUM (PORCINE) 5000 UNIT/ML IJ SOLN
5000.0000 [IU] | Freq: Three times a day (TID) | INTRAMUSCULAR | Status: DC
  Administered 2015-03-22 – 2015-03-23 (×4): 5000 [IU] via SUBCUTANEOUS
  Filled 2015-03-22 (×11): qty 1

## 2015-03-22 MED ORDER — LORAZEPAM 2 MG/ML IJ SOLN
1.0000 mg | Freq: Four times a day (QID) | INTRAMUSCULAR | Status: DC | PRN
  Filled 2015-03-22: qty 1

## 2015-03-22 MED ORDER — ADULT MULTIVITAMIN W/MINERALS CH
1.0000 | ORAL_TABLET | Freq: Every day | ORAL | Status: DC
  Administered 2015-03-23 – 2015-03-25 (×3): 1 via ORAL
  Filled 2015-03-22 (×4): qty 1

## 2015-03-22 MED ORDER — LORAZEPAM 2 MG/ML IJ SOLN: 0.0000 mg | Freq: Two times a day (BID) | INTRAMUSCULAR | Status: DC

## 2015-03-22 MED ORDER — ADULT MULTIVITAMIN W/MINERALS CH: 1.0000 | ORAL_TABLET | Freq: Every day | ORAL | Status: DC

## 2015-03-22 MED ORDER — LORAZEPAM 1 MG PO TABS
1.0000 mg | ORAL_TABLET | Freq: Four times a day (QID) | ORAL | Status: DC | PRN
  Administered 2015-03-24 – 2015-03-25 (×3): 1 mg via ORAL
  Filled 2015-03-22 (×3): qty 1

## 2015-03-22 MED ORDER — SODIUM CHLORIDE 0.9 % IJ SOLN
3.0000 mL | Freq: Two times a day (BID) | INTRAMUSCULAR | Status: DC
  Administered 2015-03-22 – 2015-03-24 (×5): 3 mL via INTRAVENOUS

## 2015-03-22 MED ORDER — FOLIC ACID 1 MG PO TABS
1.0000 mg | ORAL_TABLET | Freq: Every day | ORAL | Status: DC
  Administered 2015-03-23 – 2015-03-25 (×3): 1 mg via ORAL
  Filled 2015-03-22 (×4): qty 1

## 2015-03-22 MED ORDER — THIAMINE HCL 100 MG/ML IJ SOLN
100.0000 mg | Freq: Every day | INTRAMUSCULAR | Status: DC
  Filled 2015-03-22 (×2): qty 1

## 2015-03-22 NOTE — ED Notes (Signed)
No seizure activity noted, pt does state he feels tremors are becoming worse. Ativan given, urine sent to lab

## 2015-03-22 NOTE — ED Notes (Addendum)
Contacted pt brother/Phillip per pt request. Aneta MinsPhillip reports on way to pick pt up for discharge; Aneta Minshillip bringing clothes for pt; staff will assist dress, ambulation, and discharge when brother arrives.

## 2015-03-22 NOTE — ED Notes (Signed)
Bed: [id number] Expected date:  Expected time:  Means of arrival:  Comments: EMS 39 yo male/seizures/hx seizures, withdrawal related

## 2015-03-22 NOTE — ED Notes (Signed)
Pt states he has seizures every time he attempts to stop drinking, pt reports 2-3 seizures this year. Pt states he has attempted inpt rehab x 2. Pt has had medical admits as well.

## 2015-03-22 NOTE — ED Notes (Signed)
Pt steady gait upon discharge; assist post discharge with wheelchair as result of medications administered during previous shift.

## 2015-03-22 NOTE — ED Notes (Signed)
CN reports there will be a stat clean on the pt's room.

## 2015-03-22 NOTE — Discharge Instructions (Signed)
Alcohol Withdrawal °Anytime drug use is interfering with normal living activities it has become abuse. This includes problems with family and friends. Psychological dependence has developed when your mind tells you that the drug is needed. This is usually followed by physical dependence when a continuing increase of drugs are required to get the same feeling or "high." This is known as addiction or chemical dependency. A person's risk is much higher if there is a history of chemical dependency in the family. °Mild Withdrawal Following Stopping Alcohol, When Addiction or Chemical Dependency Has Developed °When a person has developed tolerance to alcohol, any sudden stopping of alcohol can cause uncomfortable physical symptoms. Most of the time these are mild and consist of tremors in the hands and increases in heart rate, breathing, and temperature. Sometimes these symptoms are associated with anxiety, panic attacks, and bad dreams. There may also be stomach upset. Normal sleep patterns are often interrupted with periods of inability to sleep (insomnia). This may last for 6 months. Because of this discomfort, many people choose to continue drinking to get rid of this discomfort and to try to feel normal. °Severe Withdrawal with Decreased or No Alcohol Intake, When Addiction or Chemical Dependency Has Developed °About five percent of alcoholics will develop signs of severe withdrawal when they stop using alcohol. One sign of this is development of generalized seizures (convulsions). Other signs of this are severe agitation and confusion. This may be associated with believing in things which are not real or seeing things which are not really there (delusions and hallucinations). Vitamin deficiencies are usually present if alcohol intake has been long-term. Treatment for this most often requires hospitalization and close observation. °Addiction can only be helped by stopping use of all chemicals. This is hard but may  save your life. With continual alcohol use, possible outcomes are usually loss of self respect and esteem, violence, and death. °Addiction cannot be cured but it can be stopped. This often requires outside help and the care of professionals. Treatment centers are listed in the yellow pages under Cocaine, Narcotics, and Alcoholics Anonymous. Most hospitals and clinics can refer you to a specialized care center. °It is not necessary for you to go through the uncomfortable symptoms of withdrawal. Your caregiver can provide you with medicines that will help you through this difficult period. Try to avoid situations, friends, or drugs that made it possible for you to keep using alcohol in the past. Learn how to say no. °It takes a long period of time to overcome addictions to all drugs, including alcohol. There may be many times when you feel as though you want a drink. After getting rid of the physical addiction and withdrawal, you will have a lessening of the craving which tells you that you need alcohol to feel normal. Call your caregiver if more support is needed. Learn who to talk to in your family and among your friends so that during these periods you can receive outside help. Alcoholics Anonymous (AA) has helped many people over the years. To get further help, contact AA or call your caregiver, counselor, or clergyperson. Al-Anon and Alateen are support groups for friends and family members of an alcoholic. The people who love and care for an alcoholic often need help, too. For information about these organizations, check your phone directory or call a local alcoholism treatment center.  °SEEK IMMEDIATE MEDICAL CARE IF:  °· You have a seizure. °· You have a fever. °· You experience uncontrolled vomiting or you   vomit up blood. This may be bright red or look like black coffee grounds. °· You have blood in the stool. This may be bright red or appear as a black, tarry, bad-smelling stool. °· You become lightheaded or  faint. Do not drive if you feel this way. Have someone else drive you or call 911 for help. °· You become more agitated or confused. °· You develop uncontrolled anxiety. °· You begin to see things that are not really there (hallucinate). °Your caregiver has determined that you completely understand your medical condition, and that your mental state is back to normal. You understand that you have been treated for alcohol withdrawal, have agreed not to drink any alcohol for a minimum of 1 day, will not operate a car or other machinery for 24 hours, and have had an opportunity to ask any questions about your condition. °Document Released: 07/16/2005 Document Revised: 12/29/2011 Document Reviewed: 05/24/2008 °ExitCare® Patient Information ©2015 [employer]. This information is not intended to replace advice given to you by your health care provider. Make sure you discuss any questions you have with your health care provider. ° ° ° °Emergency Department Resource Guide °1) Find a Doctor and Pay Out of Pocket °Although you won't have to find out who is covered by your insurance plan, it is a good idea to ask around and get recommendations. You will then need to call the office and see if the doctor you have chosen will accept you as a new patient and what types of options they offer for patients who are self-pay. Some doctors offer discounts or will set up payment plans for their patients who do not have insurance, but you will need to ask so you aren't surprised when you get to your appointment. ° °2) Contact Your Local Health Department °Not all health departments have doctors that can see patients for sick visits, but many do, so it is worth a call to see if yours does. If you don't know where your local health department is, you can check in your phone book. The CDC also has a tool to help you locate your state's health department, and many state websites also have listings of all of their local health  departments. ° °3) Find a Walk-in Clinic °If your illness is not likely to be very severe or complicated, you may want to try a walk in clinic. These are popping up all over the country in pharmacies, drugstores, and shopping centers. They're usually staffed by nurse practitioners or physician assistants that have been trained to treat common illnesses and complaints. They're usually fairly quick and inexpensive. However, if you have serious medical issues or chronic medical problems, these are probably not your best option. ° °No Primary Care Doctor: °- Call [facility] at  832-8000 - they can help you locate a primary care doctor that  accepts your insurance, provides certain services, etc. °- Physician Referral Service- 1-800-533-3463 ° °Chronic Pain Problems: °Organization         Address  Phone   Notes  °[facility]  (336) 297-2271 Patients need to be referred by their primary care doctor.  ° °Medication Assistance: °Organization         Address  Phone   Notes  °[place] [facility] [address] °[place], [place] [address] (336) 641-8030 --Must be a resident of [place] °-- Must have NO insurance coverage whatsoever (no Medicaid/ Medicare, etc.) °-- The pt. MUST have   a primary care doctor that directs their care regularly and follows them in the community °  °[facility]  (866) 331-1348   °[employer]  (888) 892-1162   ° °Agencies that provide inexpensive medical care: °Organization         Address  Phone   Notes  °[facility]  (336) 832-8035   °[facility]    (336) 832-7272   °[facility] [address] °[place], [place] [address] (336) 832-4777   °[facility] [address], °[place] (336) 271-4999   °[facility]    (336) 373-0678   °[facility]    (336) 272-1050   °[facility] ° [address], [place] Phone:  (336)  832-4444, Fax:  (336) 832-4440 Hours of Operation:  9 am - 6 pm, M-F.  Also accepts Medicaid/Medicare and self-pay.  °[facility] ° [address], [place] Phone: (336) 832-3150, Fax: (336) 832-3151. Hours of Operation:  8:30 am - 5:30 pm, M-F.  Also accepts Medicaid and self-pay.  °[facility] [place] [address], [place] Phone: (336) 878-6027   °[facility] [address], [place], [place] (336)723-1848, Ext. 123 Mondays & Thursdays: 7-9 AM.  First 15 patients are seen on a first come, first serve basis. °  ° °Medicaid-accepting [place] Providers: ° °Organization         Address  Phone   Notes  °[facility] [address], [place] (336) 641-2100 Also accepts self-pay patients.  °[facility] [address], [place] ° (336) 856-9996   °[facility] [address], [place] (336) 288-8857   °[facility] [address] [place] [address], [place] (336) 299-7000   °Veita Bland [address], [place]  ° (336) 373-1557 Only accepts [place] Access Medicaid patients after they have their name applied to their card.  ° °Self-Pay (no insurance) in [place]: ° °Organization         Address  Phone   Notes  °Sickle Cell Patients, [facility] [address], [place] (336) 832-1970   °[facility] [address], [place] (336) 832-4400   °[facility] ° [address],  (336) 992-4800   °[facility]/Dr. Osei-Bonsu ° [address] [place] [address], [place] or [address], [place] (336) 841-8500 Phone number for both [place] and [place] locations is the same.  °[facility] [address], [place] (336) 299-0000   °[facility] [address] [place] [address], [place] or [address] (336)  852-7530 °(336) 878-2260   °[facility] [address], [place] (336) 350-1642, phone; (336) 294-5005, fax Sees patients 1st and 3rd Saturday of every month.  Must not qualify for public or private insurance (i.e. Medicaid, Medicare, [place] Health Choice, Veterans' Benefits) • Household income should be no more than 200% of the poverty level •The clinic cannot treat you if you are pregnant or think you are pregnant • Sexually transmitted diseases are not treated at the clinic.  ° ° °Dental Care: °Organization         Address  Phone  Notes  °[facility] [address],  (  336) 641-6152 Accepts children up to age 21 who are enrolled in Medicaid or [place] Health Choice; pregnant women with a Medicaid card; and children who have applied for Medicaid or [place] Health Choice, but were declined, whose parents can pay a reduced fee at time of service.  °[facility] [place]  [address], [place] (336) 641-7733 Accepts children up to age 21 who are enrolled in Medicaid or [place] Health Choice; pregnant women with a Medicaid card; and children who have applied for Medicaid or [place] Health Choice, but were declined, whose parents can pay a reduced fee at time of service.  °[facility] ° [address], [place] (336) 641-4533 Patients are seen by appointment only. Walk-ins are not accepted. [facility] will see patients 18 years of age and older. °Monday - Tuesday (8am-5pm) °Most Wednesdays (8:30-5pm) °$30 per visit, cash only  °[facility] ° [address], [place] (336) 641-4533 Patients are seen by appointment only. Walk-ins are not accepted. [facility] will see patients 18 years of age and older. °One Wednesday Evening (Monthly: Volunteer Based).  $30 per visit, cash only  °[facility]  (919) 537-3737 for adults;  Children under age 4, call Graduate Pediatric Dentistry at (919) [phone]. Children aged 4-14, please call (919) [phone] to request a pediatric application. ° Dental services are provided in all areas of dental care including fillings, crowns and bridges, complete and partial dentures, implants, gum treatment, root canals, and extractions. Preventive care is also provided. Treatment is provided to both adults and children. °Patients are selected via a lottery and there is often a waiting list. °  °[facility] [address], °[place] ° (336) 763-8833 [email or web address] °  °[facility] [address], [place], [place] (336)723-1848, Ext. 123 Second and Fourth Thursday of each month, opens at 6:30 AM; Clinic ends at 9 AM.  Patients are seen on a first-come first-served basis, and a limited number are seen during each clinic.  ° °[facility] ° [address], [place], [place] (336) 723-7904   Eligibility Requirements °You must have lived in [place], [place], or [place] counties for at least the last three months. °  You cannot be eligible for state or federal sponsored healthcare insurance, including Veterans Administration, Medicaid, or Medicare. °  You generally cannot be eligible for healthcare insurance through your employer.  °  How to apply: °Eligibility screenings are held every Tuesday and Wednesday afternoon from 1:00 pm until 4:00 pm. You do not need an appointment for the interview!  °[facility] [address], [place], [place] 336-631-2330   °[facility]  336-342-8273   °[facility]  336-703-3100   °[facility]  336-570-6415   ° °Behavioral Health Resources in the Community: °Intensive Outpatient Programs °Organization         Address  Phone  Notes  °[facility] [address], [place], [place] 336-878-6098   °[facility] [address], [place], [place] 336-832-9800   °[facility] [address], [place], [place] ° 336-882-2125   °[facility] [address],  °[place], [place] 1-800-853-5163 or 336-641-4981   °Substance Abuse Resources °Organization         Address  Phone  Notes  °[facility]    336-882-2125   °[facility]  336-784-9470   °[facility]  336-285-9073   °[facility]  336-845-3988   °Residential & Outpatient Substance Abuse Program  1-800-659-3381   °Psychological Services °Organization         Address  Phone  Notes  °[facility]  336- 832-9600   °[facility]  336- 378-7881   °[facility] [address], [place] 1-800-853-5163 or 336-641-4981   ° °Mobile Crisis Teams °Organization         Address  Phone  Notes  °[facility], Mobile Crisis Care Unit  1-877-626-1772   °Assertive °[facility] ° [address] [place], [place] 336-834-9664   °Sharon DeEsch [address] °[place] 336-554-5454   ° °Self-Help/Support Groups °Organization         Address  Phone             Notes  °[facility] - variety of support groups  336- 373-1402 Call for more information  °Narcotics Anonymous (NA), [facility] [address], °[place]  2 meetings at this location  ° °Residential Treatment Programs °Organization         Address  Phone  Notes  °[facility] [address],    °[place]  1-866-801-8205   °[facility] ° [address], [place], [place] 704-293-8524   °[facility] [address], [place] 336-845-3988 Admissions: 8am-3pm M-F  °[facility] [address]    °[place], [place] 336-841-1104   °[facility] [address], [place], [place] 336-379-7146   °[facility] [address]  °[place], [place] 336-285-9073   °[facility] - Intensive  Outpatient [address], [place], [place] 336-852-3033   °[facility] [address]  °[place], [place] 1-877-615-2722 or 336-784-9470   °[facility] [address] [place], [place] 336-227-7417 Accepts Medicaid  °[facility] [address]  ° [place] 1-800-659-3381 Substance Abuse/Addiction Treatment  ° °[place] Behavioral Health Resources °Organization         Address  Phone  Notes  °[facility]  (888) 581-9988   °Julie Brannon, PhD [address] [place], [place]   (336) 349-5553 or (336) 951-0000   °[facility]   [address] °[place], [place] (336) 349-4454   °[facility] [address], [place], [place] (336) 342-8316 Insurance/Medicaid/sponsorship through [facility]  °[facility] [address]                                    [place], [place] (336) 342-8316 Therapy/tele-psych/case  °[facility] [address]  ° [place], [place] (336) 349-2233    °Dr. Arfeen  (336) 349-4544   °[facility] [place]  [employer] [place] [facility] 1) [address], [place] °2) [address], [place] °3)  [address], [place] (336) 349-3220 °(336) 342-7768 ° °(336) 342-8140   °[place] [facility] (336) 342-1394 or (336) 342-3537 (After Hours)    ° ° ° °

## 2015-03-22 NOTE — ED Notes (Signed)
Patient was discharged this AM for the same. Patient states he was prescribed Librium and only has taken one dose as prescribed. Patient had a seizure around 1400 today witnessed but person not present.Patient states his last drink was 2 weeks ago. Patient has tongue injury to the underneath. Patient unsure if he hit his head.

## 2015-03-22 NOTE — H&P (Signed)
[facility] History and Physical  JHON MALLOZZI ZOX:[id number] DOB: [birth year] DOA: 03/22/2015  Referring physician: Eber Hong, MD PCP: No PCP Per Patient   Chief Complaint: seizure  HPI: Dwayne Bolton is a 39 y.o. male with hsitory of Ongoing Alcohol abuse presents with a seizure. He admits to heavy alcohol use but has been trying to stop drinking. He states that he was here last night after he decided to stop drinking. He states that he suffered multiple seizures he states witnessed by his brother who lives with him. Patient states that he had some tongue biting and states he lost control of his bladder and bowels. Patient states that he came into the ED last night and was given some librium to take which he has not taken. He was given ativan in the ED acutely for the seizures. Patient was also given thiamine MVI and fluids. He was sent home and states he had another seizure early this morning. He is now awake and alert in the ED. He has not had repeat seizures here.   Review of Systems:  Constitutional:  No weight loss, night sweats, Fevers, chills HEENT:  No headaches, No sneezing, itching, ear ache, nasal congestion, post nasal drip,  Cardio-vascular:  No chest pain, PND, swelling in lower extremities  GI:  No heartburn, indigestion, abdominal pain, nausea, vomiting, diarrhea  Resp:  No shortness of breath with exertion or at rest. No coughing up of blood  Skin:  no rash or lesions.  GU:  no dysuria, change in color of urine Musculoskeletal:  No joint pain or swelling. No back pain.  Psych:  + depression and +anxiety.   Past Medical History  Diagnosis Date  . Alcohol abuse   . Seizure    History reviewed. No pertinent past surgical history. Social History:  reports that he has been smoking Cigarettes.  He has a 1 pack-year smoking history. He uses smokeless tobacco. He reports that he drinks alcohol. He reports that he does not use illicit drugs.  No Known  Allergies  Family History  Problem Relation Age of Onset  . Diabetes Mother   . Hypertension Mother      Prior to Admission medications   Medication Sig Start Date End Date Taking? Authorizing Provider  chlordiazePOXIDE (LIBRIUM) 25 MG capsule  PO TID x 1D, then  PO BID X 1D, then  PO QD X 1D then  PO QD 03/22/15  Yes Kristen N Ward, DO  folic acid (FOLVITE) 1 MG tablet Take 1 tablet (1 mg total) by mouth daily. 03/22/15  Yes Kristen N Ward, DO  multivitamin (ONE-A-DAY MEN'S) TABS tablet Take 1 tablet by mouth daily. 03/22/15  Yes Kristen N Ward, DO  thiamine (VITAMIN B-1) 100 MG tablet Take 1 tablet (100 mg total) by mouth daily. 03/22/15  Yes Layla Maw Ward, DO   Physical Exam: Filed Vitals:   03/22/15 1444  BP: 123/76  Pulse: 97  Temp: 98.3 F (36.8 C)  TempSrc: Oral  Resp: 15  SpO2: 97%    Wt Readings from Last 3 Encounters:  03/22/15 81.647 kg (180 lb)  12/22/14 70.308 kg (155 lb)  12/15/13 86.183 kg (190 lb)    General:  Appears calm and comfortable Eyes: PERRL, normal lids, irises & conjunctiva ENT: grossly normal hearing, lips & tongue Neck: no LAD, masses or thyromegaly Cardiovascular: RRR, no m/r/g. No LE edema. Respiratory: CTA bilaterally, no w/r/r. Normal respiratory effort. Abdomen: soft, ntnd Skin: no rash or induration seen on limited  exam Musculoskeletal: grossly normal tone BUE/BLE Psychiatric: grossly normal mood and affect, speech fluent and appropriate Neurologic: grossly non-focal.          Labs on Admission:  Basic Metabolic Panel:  Recent Labs Lab 03/22/15 0205  NA 134*  K 3.7  CL 97*  CO2 23  GLUCOSE 92  BUN 12  CREATININE 0.87  CALCIUM 9.4   Liver Function Tests:  Recent Labs Lab 03/22/15 0205  AST 102*  ALT 78*  ALKPHOS 104  BILITOT 0.7  PROT 7.5  ALBUMIN 4.3   No results for input(s): LIPASE, AMYLASE in the last 168 hours. No results for input(s): AMMONIA in the last 168 hours. CBC:  Recent Labs Lab  03/22/15 0205  WBC 8.0  NEUTROABS 5.5  HGB 13.0  HCT 37.0*  MCV 93.7  PLT 155   Cardiac Enzymes: No results for input(s): CKTOTAL, CKMB, CKMBINDEX, TROPONINI in the last 168 hours.  BNP (last 3 results) No results for input(s): BNP in the last 8760 hours.  ProBNP (last 3 results) No results for input(s): PROBNP in the last 8760 hours.  CBG:  Recent Labs Lab 03/22/15 0131 03/22/15 1504  GLUCAP 86 142*    Radiological Exams on Admission: No results found.    Assessment/Plan Active Problems:   Alcohol dependence   Seizures   Alcohol withdrawal seizure   1. ETOH withdrawal Seizure -will admit for observation -started on ETOH withdrawal protocol -Ativan PRN -Will hold off on anti-seizure meds for now  -Started on IVF banana bag in ED  -Advance diet as tolerated  2. ETOH abuse -counseling on cessation of ETOH -will need CSW consult  3. Mild Hyponatremia -will hydrate and monitor labs  4. Transaminase elevation -secondary to ETOH abuse   Code Status: Full Code (must indicate code status--if unknown or must be presumed, indicate so) DVT Prophylaxis:Heparin Family Communication: None (indicate person spoken with, if applicable, with phone number if by telephone) Disposition Plan: Home (indicate anticipated LOS)  Time spent: 70min  [facility]bleKHAN,SAADAT A [facility] Pager [phone]

## 2015-03-22 NOTE — ED Notes (Signed)
Pt resting with eyes closed, RR even and unlabored, NAD. Side rails up with seizure pads for safety.

## 2015-03-22 NOTE — ED Notes (Signed)
Pt sleeping soundly at this time. Pt to be discharged when he is able to be aroused easier and ride arrives

## 2015-03-22 NOTE — ED Provider Notes (Signed)
CSN: [id number]     Arrival date & time 03/22/15  1444 History   First MD Initiated Contact with Patient 03/22/15 1509     Chief Complaint  Patient presents with  . Seizures     (Consider location/radiation/quality/duration/timing/severity/associated sxs/prior Treatment) HPI Comments: The pt is an alcoholic - history of severe heavy alcohol use - has been in and out of rehab and had some successful detox on ativan in the past - has been drinking for 4 months straight until he decided to stop yesterday - had multiple seizures yesterday and was seen last night for ETOH withdrawal seizures (hx of same as well), was given ativan, fluids and replacment of thiamine - no seizures, expressed interest at going home on librium - had morning dose - 2 tabs as prescribed but didn't have afternoon dose yet - had another seizure - tonic clonic, with tongue biting again.  He had no incontinence.  He takes no antiseizures meds.  He has a feeling of weakness but no other symptoms at this time.  No bleedign from tongue.  Patient is a 39 y.o. male presenting with seizures. The history is provided by the patient and the EMS personnel.  Seizures   Past Medical History  Diagnosis Date  . Alcohol abuse   . Seizure    History reviewed. No pertinent past surgical history. Family History  Problem Relation Age of Onset  . Diabetes Mother   . Hypertension Mother    History  Substance Use Topics  . Smoking status: Current Every Day Smoker -- 0.25 packs/day for 4 years    Types: Cigarettes  . Smokeless tobacco: Current User  . Alcohol Use: Yes     Comment: drinks @ 9 beers to keep w/d s/s down    Review of Systems  Neurological: Positive for seizures.  All other systems reviewed and are negative.     Allergies  Review of patient's allergies indicates no known allergies.  Home Medications   Prior to Admission medications   Medication Sig Start Date End Date Taking? Authorizing Provider   chlordiazePOXIDE (LIBRIUM) 25 MG capsule  PO TID x 1D, then  PO BID X 1D, then  PO QD X 1D then  PO QD 03/22/15  Yes Kristen N Ward, DO  folic acid (FOLVITE) 1 MG tablet Take 1 tablet (1 mg total) by mouth daily. 03/22/15  Yes Kristen N Ward, DO  multivitamin (ONE-A-DAY MEN'S) TABS tablet Take 1 tablet by mouth daily. 03/22/15  Yes Kristen N Ward, DO  thiamine (VITAMIN B-1) 100 MG tablet Take 1 tablet (100 mg total) by mouth daily. 03/22/15  Yes Kristen N Ward, DO   BP 140/90 mmHg  Pulse 58  Temp(Src) 98.3 F (36.8 C) (Oral)  Resp 15  Ht  (1.803 m)  Wt 173 lb 8 oz (78.7 kg)  BMI 24.21 kg/m2  SpO2 99% Physical Exam  Constitutional: He appears well-developed and well-nourished. No distress.  HENT:  Head: Normocephalic and atraumatic.  Mouth/Throat: Oropharynx is clear and moist. No oropharyngeal exudate.  Laceration through left lateral inferiour tongue - ~ 1cm in length, no bleeding, no gaping.  No dental injuries.  Eyes: Conjunctivae and EOM are normal. Pupils are equal, round, and reactive to light. Right eye exhibits no discharge. Left eye exhibits no discharge. No scleral icterus.  Neck: Normal range of motion. Neck supple. No JVD present. No thyromegaly present.  Cardiovascular: Normal rate, regular rhythm, normal heart sounds and intact distal pulses.  Exam reveals  no gallop and no friction rub.   No murmur heard. Pulmonary/Chest: Effort normal and breath sounds normal. No respiratory distress. He has no wheezes. He has no rales.  Abdominal: Soft. Bowel sounds are normal. He exhibits no distension and no mass. There is no tenderness.  Musculoskeletal: Normal range of motion. He exhibits no edema or tenderness.  Lymphadenopathy:    He has no cervical adenopathy.  Neurological: He is alert. Coordination normal.  Mild tremor - no other neuro findigns - CN3-12 normal, strength normal, speech normal and memory intact other than seizure events.  Skin: Skin is warm and  dry. No rash noted. No erythema.  Psychiatric: He has a normal mood and affect. His behavior is normal.  Nursing note and vitals reviewed.   ED Course  Procedures (including critical care time) Labs Review Labs Reviewed  CBC - Abnormal; Notable for the following:    RBC 3.77 (*)    Hemoglobin 12.5 (*)    HCT 35.9 (*)    All other components within normal limits  COMPREHENSIVE METABOLIC PANEL - Abnormal; Notable for the following:    Potassium 3.2 (*)    Calcium 8.7 (*)    AST 56 (*)    All other components within normal limits  CBC - Abnormal; Notable for the following:    RBC 3.83 (*)    Hemoglobin 12.6 (*)    HCT 36.3 (*)    All other components within normal limits  CBG MONITORING, ED - Abnormal; Notable for the following:    Glucose-Capillary 142 (*)    All other components within normal limits  MRSA PCR SCREENING  CREATININE, SERUM  TSH  HEMOGLOBIN A1C    Imaging Review Ct Head Wo Contrast  03/22/2015   CLINICAL DATA:  Acute onset of seizure.  Initial encounter.  EXAM: CT HEAD WITHOUT CONTRAST  TECHNIQUE: Contiguous axial images were obtained from the base of the skull through the vertex without intravenous contrast.  COMPARISON:  None.  FINDINGS: There is no evidence of acute infarction, mass lesion, or intra- or extra-axial hemorrhage on CT.  The posterior fossa, including the cerebellum, brainstem and fourth ventricle, is within normal limits. The third and lateral ventricles, and basal ganglia are unremarkable in appearance. The cerebral hemispheres are symmetric in appearance, with normal gray-white differentiation. No mass effect or midline shift is seen.  There is no evidence of fracture; visualized osseous structures are unremarkable in appearance. The visualized portions of the orbits are within normal limits. The paranasal sinuses and mastoid air cells are well-aerated. No significant soft tissue abnormalities are seen.  IMPRESSION: Unremarkable noncontrast CT of the  head.   Electronically Signed   By: Roanna RaiderJeffery  Chang M.D.   On: 03/22/2015 19:35      MDM   Final diagnoses:  Seizures    Recurrent seizure while on withdrawal meds - had labs 12 hours ago - mild transaminities - low ETOH, no indication to repeat them at this time.  Ativan Fluids Reassess   CT head ordered as was not part of initial evaluation - due to ongoing seizures, ordered,  D/w Dr. Welton FlakesKhan who will admit to ongoing alcohol withdrawal seizures  Eber HongBrian Dylan Monforte, MD 03/23/15 [phone]

## 2015-03-22 NOTE — ED Notes (Signed)
Pts brother called for d/c. Left message

## 2015-03-22 NOTE — ED Provider Notes (Signed)
TIME SEEN: 1:50 AM  CHIEF COMPLAINT: Alcohol withdrawal seizure  HPI: Pt is a 39 y.o. with h/o alcohol abuse with history of alcohol withdrawal seizures who presents to the emergency department with complaints of multiple seizures at home today.  States he has been trying to stop drinking and feels he had seizure secondary to this. He did have 2 beers this morning. Denies any other drug use. No history of epilepsy for himself or family. He states he did bite his tongue. No other pain or obvious injury. Denies fever. Denies vomiting or diarrhea. States he does not drink every day.  ROS: See HPI Constitutional: no fever  Eyes: no drainage  ENT: no runny nose   Cardiovascular:  no chest pain  Resp: no SOB  GI: no vomiting GU: no dysuria Integumentary: no rash  Allergy: no hives  Musculoskeletal: no leg swelling  Neurological: no slurred speech ROS otherwise negative  PAST MEDICAL HISTORY/PAST SURGICAL HISTORY:  Past Medical History  Diagnosis Date  . Alcohol abuse   . Seizure     MEDICATIONS:  Prior to Admission medications   Medication Sig Start Date End Date Taking? Authorizing Provider  sertraline (ZOLOFT) 50 MG tablet Take 1 tablet (50 mg total) by mouth daily. For depression and anxiety. Patient not taking: Reported on 09/09/2014 01/13/14   Tamala JulianNeil T Mashburn, PA-C    ALLERGIES:  No Known Allergies  SOCIAL HISTORY:  History  Substance Use Topics  . Smoking status: Current Every Day Smoker -- 0.25 packs/day for 4 years    Types: Cigarettes  . Smokeless tobacco: Current User  . Alcohol Use: Yes     Comment: drinks @ 9 beers to keep w/d s/s down    FAMILY HISTORY: Family History  Problem Relation Age of Onset  . Diabetes Mother   . Hypertension Mother     EXAM: BP 149/84 mmHg  Pulse 86  Temp(Src) 98.6 F (37 C) (Oral)  Resp 18  Ht 5\' 11"  (1.803 m)  Wt 180 lb (81.647 kg)  BMI 25.12 kg/m2  SpO2 97% CONSTITUTIONAL: Alert and oriented and responds appropriately  to questions. Well-appearing; well-nourished HEAD: Normocephalic EYES: Conjunctivae clear, PERRL ENT: normal nose; no rhinorrhea; moist mucous membranes; pharynx without lesions noted, no dental injury, no septal hematoma, bilateral small tongue lacerations that are very superficial and hemostatic NECK: Supple, no meningismus, no LAD; no midline spinal tenderness or step-off or deformity CARD: RRR; S1 and S2 appreciated; no murmurs, no clicks, no rubs, no gallops RESP: Normal chest excursion without splinting or tachypnea; breath sounds clear and equal bilaterally; no wheezes, no rhonchi, no rales, no hypoxia or respiratory distress, speaking full sentences ABD/GI: Normal bowel sounds; non-distended; soft, non-tender, no rebound, no guarding, no peritoneal signs BACK:  The back appears normal and is non-tender to palpation, there is no CVA tenderness; no midline spinal tenderness or step-off or deformity EXT: Normal ROM in all joints; non-tender to palpation; no edema; normal capillary refill; no cyanosis, no calf tenderness or swelling    SKIN: Normal color for age and race; warm NEURO: Moves all extremities equally, sensation to light touch intact diffusely, cranial nerves II through XII intact PSYCH: The patient's mood and manner are appropriate. Grooming and personal hygiene are appropriate.  MEDICAL DECISION MAKING: Patient here with alcohol withdrawal seizure. CIWA score is 7.  We'll give IV fluids, thiamine, Ativan. Will check labs, urine. Patient reports he does want detox.  ED PROGRESS: Patient's labs are unremarkable. He does have hematuria  which has been present in the past. No other sign of infection. Alcohol level is 37. He is not intoxicated. Reports he has detoxed at home using lorazepam. States he is interested in detox. He has not had any see with scores over 7. When I went to reassess him he is sleeping comfortably, not tachycardic or hypertensive. No further seizure activity. I do  not feel he needs inpatient admission given low see the score, no severe alcohol withdrawal symptoms in the emergency department. He is comfortable with plan for discharge home on Librium taper. We'll also provide him with outpatient resources for rehabilitation. Discussed return precautions. He verbalized understanding and is comfortable with plan.     Layla Maw Shanyia Stines, DO 03/22/15 [phone]

## 2015-03-22 NOTE — ED Notes (Signed)
Pt was given urinal pt stated he is unable to give a specimen at this moment

## 2015-03-22 NOTE — ED Notes (Signed)
Pt brother would like for us to call him concerning his brothers treatment. He can be reached at [phone]. Call him before any decisions are made please.

## 2015-03-22 NOTE — ED Notes (Signed)
Attempted to call report to 2 west ICU. [facility] and that this  patient is going to be admitted in different room where another patient is still bedded waiting to be discharged. ED charge nurse made aware.

## 2015-03-22 NOTE — ED Notes (Signed)
Pt transported from home after witnessed seizures by family, possible multiple over 3-4 min period starting at MN. Pt alert but drowsy. +urinary incontinence, +tongue trauma. Pt states he has been attempting to slow down drinking etoh. 3 beers tonight. #18 L AC.

## 2015-03-23 ENCOUNTER — Inpatient Hospital Stay (HOSPITAL_COMMUNITY): Payer: Self-pay

## 2015-03-23 ENCOUNTER — Inpatient Hospital Stay (HOSPITAL_COMMUNITY)
Admit: 2015-03-23 | Discharge: 2015-03-23 | Disposition: A | Discharge status: Home / Self Care | Payer: Self-pay | Attending: Internal Medicine | Admitting: Internal Medicine

## 2015-03-23 DIAGNOSIS — R569 Unspecified convulsions: Secondary | ICD-10-CM

## 2015-03-23 DIAGNOSIS — E876 Hypokalemia: Secondary | ICD-10-CM

## 2015-03-23 DIAGNOSIS — F10230 Alcohol dependence with withdrawal, uncomplicated: Secondary | ICD-10-CM

## 2015-03-23 DIAGNOSIS — F1020 Alcohol dependence, uncomplicated: Secondary | ICD-10-CM

## 2015-03-23 LAB — COMPREHENSIVE METABOLIC PANEL
ALT: 55 U/L (ref 17–63)
AST: 56 U/L — AB (ref 15–41)
Albumin: 3.6 g/dL (ref 3.5–5.0)
Alkaline Phosphatase: 96 U/L (ref 38–126)
Anion gap: 11 (ref 5–15)
BUN: 8 mg/dL (ref 6–20)
CO2: 25 mmol/L (ref 22–32)
Calcium: 8.7 mg/dL — ABNORMAL LOW (ref 8.9–10.3)
Chloride: 102 mmol/L (ref 101–111)
Creatinine, Ser: 0.7 mg/dL (ref 0.61–1.24)
GFR calc Af Amer: >60 mL/min (ref >60)
GFR calc non Af Amer: >60 mL/min (ref >60)
GLUCOSE: 98 mg/dL (ref 65–99)
POTASSIUM: 3.2 mmol/L — AB (ref 3.5–5.1)
Sodium: 138 mmol/L (ref 135–145)
Total Bilirubin: 1 mg/dL (ref 0.3–1.2)
Total Protein: 6.5 g/dL (ref 6.5–8.1)

## 2015-03-23 LAB — CBC
HEMATOCRIT: 36.3 % — AB (ref 39.0–52.0)
Hemoglobin: 12.6 g/dL — ABNORMAL LOW (ref 13.0–17.0)
MCH: 32.9 pg (ref 26.0–34.0)
MCHC: 34.7 g/dL (ref 30.0–36.0)
MCV: 94.8 fL (ref 78.0–100.0)
Platelets: 167 10*3/uL (ref 150–400)
RBC: 3.83 MIL/uL — ABNORMAL LOW (ref 4.22–5.81)
RDW: 14.2 % (ref 11.5–15.5)
WBC: 6.9 10*3/uL (ref 4.0–10.5)

## 2015-03-23 LAB — GLUCOSE, CAPILLARY: Glucose-Capillary: 96 mg/dL (ref 65–99)

## 2015-03-23 MED ORDER — POTASSIUM CHLORIDE CRYS ER 20 MEQ PO TBCR
40.0000 meq | EXTENDED_RELEASE_TABLET | Freq: Once | ORAL | Status: AC
  Administered 2015-03-23: 40 meq via ORAL
  Filled 2015-03-23: qty 2

## 2015-03-23 MED ORDER — FLUOXETINE HCL 20 MG PO CAPS
20.0000 mg | ORAL_CAPSULE | Freq: Every day | ORAL | Status: DC
  Administered 2015-03-23 – 2015-03-25 (×3): 20 mg via ORAL
  Filled 2015-03-23 (×4): qty 1

## 2015-03-23 MED ORDER — GADOBENATE DIMEGLUMINE 529 MG/ML IV SOLN
16.0000 mL | Freq: Once | INTRAVENOUS | Status: AC | PRN
Start: 2015-03-23 — End: 2015-03-23
  Administered 2015-03-23: 16 mL via INTRAVENOUS

## 2015-03-23 MED ORDER — TRAZODONE HCL 50 MG PO TABS
50.0000 mg | ORAL_TABLET | Freq: Every day | ORAL | Status: DC
Start: 2015-03-23 — End: 2015-03-25
  Administered 2015-03-23: 50 mg via ORAL
  Filled 2015-03-23 (×3): qty 1

## 2015-03-23 NOTE — Progress Notes (Signed)
Report given to nurse. Patient is stable at transfer. 

## 2015-03-23 NOTE — Procedures (Signed)
ELECTROENCEPHALOGRAM REPORT  Date of Study: 03/23/2015  Patient's Name: Dwayne IsaacsROBERT Petkus MRN: [id number] Date of Birth: 1976-03-15  Referring Provider: Dr. Jeoffrey MassedShanker Ghimire  Clinical History: This is a 39 year old man with seizure. He admits to heavy alcohol use but has been trying to stop drinking.   Medications: chlordiazePOXIDE (LIBRIUM) folic acid (FOLVITE)  multivitamin (ONE-A-DAY MEN'S) TABS  thiamine (VITAMIN B-1) 100 MG tablet  Technical Summary: A multichannel digital EEG recording measured by the international 10-20 system with electrodes applied with paste and impedances below 5000 ohms performed in our laboratory with EKG monitoring in an awake and asleep patient.  Hyperventilation and photic stimulation were not performed.  The digital EEG was referentially recorded, reformatted, and digitally filtered in a variety of bipolar and referential montages for optimal display.    Description: The patient is awake and asleep during the recording.  During maximal wakefulness, there is a symmetric, medium voltage 10 Hz posterior dominant rhythm that attenuates with eye opening.  The record is symmetric.  During drowsiness and sleep, there is an increase in theta slowing of the background.  Vertex waves and symmetric sleep spindles were seen.  Hyperventilation and photic stimulation were not performed. There were no epileptiform discharges or electrographic seizures seen.    EKG lead was unremarkable.  Impression: This awake and asleep EEG is normal.    Clinical Correlation: A normal EEG does not exclude a clinical diagnosis of epilepsy.  Clinical correlation is advised.   Patrcia DollyKaren Sukaina Toothaker, M.D.

## 2015-03-23 NOTE — Care Management Note (Signed)
Case Management Note  Patient Details  Name: Dwayne Bolton MRN: [id number] Date of Birth: October 15, 1976  Subjective/Objective:              Seizures due to etoh abuse.      Action/Plan:  Home when stable   Expected Discharge Date:          [id number]        Expected Discharge Plan:  Home/Self Care  In-House Referral:  Clinical Social Work  Discharge planning Services  CM Consult  Post Acute Care Choice:  NA Choice offered to:  NA  DME Arranged:    DME Agency:     HH Arranged:    HH Agency:     Status of Service:  In process, will continue to follow  Medicare Important Message Given:    Date Medicare IM Given:    Medicare IM give by:    Date Additional Medicare IM Given:    Additional Medicare Important Message give by:     If discussed at Long Length of Stay Meetings, dates discussed:    Additional Comments:  Golda AcreDavis, Daejah Klebba Lynn, RN 03/23/2015, 8:59 AM

## 2015-03-23 NOTE — Progress Notes (Signed)
PATIENT DETAILS Name: Dwayne IsaacsROBERT Bolton Age: 39 y.o. Sex: male Date of Birth: 05/06/1976 Admit Date: 03/22/2015 Admitting Physician Yevonne PaxSaadat A Khan, MD PCP:No PCP Per Patient  Subjective: Awake and alert, minimally tremulous. Claims last drink approximately 4 days back. Claims he had prior seizures approximately a year back when he came off alcohol as well.  Assessment/Plan: Active Problems: Seizure: Suspected secondary from alcohol withdrawal. Continue Ativan per protocol. Check MRI brain//EEG. But doubt need for antiepileptics at present. Denies history of seizures, head injury, or intracranial infection.  Alcohol abuse: His last drink was approximately 4 days back. Is awake and alert, mildly tremulous. I have counseled extensively. Social Counsellorworker evaluation.  Depression: Claims that he divorced approximately 3 years ago, following back he started drinking and has been feeling depressed since then. Denies any suicidal or homicidal ideation. Psychiatry consultation called.  Hypokalemia: Replete and recheck.  Disposition: Remain inpatient-transfer to med surg  Antimicrobial agents  See below  Anti-infectives    None      DVT Prophylaxis: Prophylactic Heparin   Code Status: Full code   Family Communication None  Procedures: None  CONSULTS:  psychiatry  Time spent 30 minutes-Greater than 50% of this time was spent in counseling, explanation of diagnosis, planning of further management, and coordination of care.  MEDICATIONS: Scheduled Meds: . folic acid  1 mg Oral Daily  . heparin  5,000 Units Subcutaneous 3 times per day  . multivitamin with minerals  1 tablet Oral Daily  . potassium chloride  40 mEq Oral Once  . sodium chloride  3 mL Intravenous Q12H  . thiamine  100 mg Oral Daily   Or  . thiamine  100 mg Intravenous Daily   Continuous Infusions:  PRN Meds:.LORazepam **OR** LORazepam, ondansetron **OR** ondansetron (ZOFRAN) IV    PHYSICAL  EXAM: Vital signs in last 24 hours: Filed Vitals:   03/23/15 0200 03/23/15 0400 03/23/15 0500 03/23/15 0600  BP: 144/85  145/101 140/90  Pulse: 59  59 58  Temp:  98.3 F (36.8 C)    TempSrc:  Oral    Resp: 11  14 15   Height:  5\' 11"  (1.803 m)    Weight:  78.7 kg (173 lb 8 oz)    SpO2: 97%  97% 99%    Weight change:  Filed Weights   03/23/15 0400  Weight: 78.7 kg (173 lb 8 oz)   Body mass index is 24.21 kg/(m^2).   Gen Exam: Awake and alert with clear speech.  Neck: Supple, No JVD.  Chest: B/L Clear.   CVS: S1 S2 Regular, no murmurs.  Abdomen: soft, BS +, non tender, non distended.  Extremities: no edema, lower extremities warm to touch. Neurologic: Non Focal.  Minimally tremulous. Skin: No Rash.   Wounds: N/A.    Intake/Output from previous day:  Intake/Output Summary (Last 24 hours) at 03/23/15 0736 Last data filed at 03/23/15 0600  Gross per 24 hour  Intake    840 ml  Output   1550 ml  Net   -710 ml     LAB RESULTS: CBC  Recent Labs Lab 03/22/15 0205 03/22/15 2146 03/23/15 0335  WBC 8.0 7.9 6.9  HGB 13.0 12.5* 12.6*  HCT 37.0* 35.9* 36.3*  PLT 155 169 167  MCV 93.7 95.2 94.8  MCH 32.9 33.2 32.9  MCHC 35.1 34.8 34.7  RDW 14.1 14.4 14.2  LYMPHSABS 0.7  --   --  MONOABS 1.7*  --   --   EOSABS 0.0  --   --   BASOSABS 0.1  --   --     Chemistries   Recent Labs Lab 03/22/15 0205 03/22/15 2146 03/23/15 0335  NA 134*  --  138  K 3.7  --  3.2*  CL 97*  --  102  CO2 23  --  25  GLUCOSE 92  --  98  BUN 12  --  8  CREATININE 0.87 0.84 0.70  CALCIUM 9.4  --  8.7*    CBG:  Recent Labs Lab 03/22/15 0131 03/22/15 1504  GLUCAP 86 142*    GFR Estimated Creatinine Clearance: 136 mL/min (by C-G formula based on Cr of 0.7).  Coagulation profile No results for input(s): INR, PROTIME in the last 168 hours.  Cardiac Enzymes No results for input(s): CKMB, TROPONINI, MYOGLOBIN in the last 168 hours.  Invalid input(s): CK  Invalid  input(s): POCBNP No results for input(s): DDIMER in the last 72 hours. No results for input(s): HGBA1C in the last 72 hours. No results for input(s): CHOL, HDL, LDLCALC, TRIG, CHOLHDL, LDLDIRECT in the last 72 hours.  Recent Labs  03/22/15 2146  TSH 3.901   No results for input(s): VITAMINB12, FOLATE, FERRITIN, TIBC, IRON, RETICCTPCT in the last 72 hours. No results for input(s): LIPASE, AMYLASE in the last 72 hours.  Urine Studies No results for input(s): UHGB, CRYS in the last 72 hours.  Invalid input(s): UACOL, UAPR, USPG, UPH, UTP, UGL, UKET, UBIL, UNIT, UROB, ULEU, UEPI, UWBC, URBC, UBAC, CAST, UCOM, BILUA  MICROBIOLOGY: Recent Results (from the past 240 hour(s))  MRSA PCR Screening     Status: None   Collection Time: 03/22/15  9:15 PM  Result Value Ref Range Status   MRSA by PCR NEGATIVE NEGATIVE Final    Comment:        The GeneXpert MRSA Assay (FDA approved for NASAL specimens only), is one component of a comprehensive MRSA colonization surveillance program. It is not intended to diagnose MRSA infection nor to guide or monitor treatment for MRSA infections.     RADIOLOGY STUDIES/RESULTS: Ct Head Wo Contrast  03/22/2015   CLINICAL DATA:  Acute onset of seizure.  Initial encounter.  EXAM: CT HEAD WITHOUT CONTRAST  TECHNIQUE: Contiguous axial images were obtained from the base of the skull through the vertex without intravenous contrast.  COMPARISON:  None.  FINDINGS: There is no evidence of acute infarction, mass lesion, or intra- or extra-axial hemorrhage on CT.  The posterior fossa, including the cerebellum, brainstem and fourth ventricle, is within normal limits. The third and lateral ventricles, and basal ganglia are unremarkable in appearance. The cerebral hemispheres are symmetric in appearance, with normal gray-white differentiation. No mass effect or midline shift is seen.  There is no evidence of fracture; visualized osseous structures are unremarkable in  appearance. The visualized portions of the orbits are within normal limits. The paranasal sinuses and mastoid air cells are well-aerated. No significant soft tissue abnormalities are seen.  IMPRESSION: Unremarkable noncontrast CT of the head.   Electronically Signed   By: Roanna Raider M.D.   On: 03/22/2015 19:35    Jeoffrey Massed, MD  [facility] Pager:[phone]  If 7PM-7AM, please contact night-coverage [email or web address] Password [email or web address] 03/23/2015, 7:36 AM   LOS: 1 day

## 2015-03-23 NOTE — Progress Notes (Signed)
Date:  March 23, 2015 U.R. performed for needs and level of care. Will continue to follow for Case Management needs.  Rhonda Davis, RN, BSN, CCM   [phone] 

## 2015-03-23 NOTE — Consult Note (Signed)
[facility] Face-to-Face Psychiatry Consult   Reason for Consult:  Alcohol withdrawal seizures Referring Physician:  Dr. Sloan Leiter Patient Identification: Dwayne Bolton MRN:  [id number] Principal Diagnosis: Alcohol withdrawal seizure Diagnosis:   Patient Active Problem List   Diagnosis Date Noted  . Alcohol withdrawal seizure [F10.239] 03/22/2015  . GAD (generalized anxiety disorder) [F41.1] 01/13/2014  . Adjustment disorder with mixed anxiety and depressed mood [F43.23] 01/13/2014  . Seizures [R56.9] 01/12/2014  . Elevated liver enzymes [R74.8] 01/12/2014  . Alcohol dependence [F10.20] 01/11/2014    Total Time spent with patient: 1 hour  Subjective:   Dwayne Bolton is a 39 y.o. male patient admitted with alcohol withdrawal seizure.  HPI: Dwayne Bolton is a 39 y.o. male seen for the face to face psychiatric consultation and evaluation of alcohol withdrawal seizures and depression. Patient reportedly separated from his wife about a year ago and starting drinking beer and wine and has one previous detox treatment at [facility] in March 2015. He also reportedly went to hospital in [place] while staying with another brother and few weeks of detox and rehab in [facility] of [place]. He was sober about four months and than relapsed when attended a party. Patient is currently staying with his brother in [place] and works with him like a part time. He states that he was here last night after he decided to stop drinking. He states that he suffered multiple seizures he states witnessed by his brother who lives with him. Patient had tongue biting and states he lost control of his bladder and bowels.   Patient came into the ED last night and was given librium to take which he has not taken. He was given ativan in the ED acutely for the seizures. Patient was also given thiamine MVI and fluids. He was sent home and states he had another seizure early this morning. Patient states that he stopped taking his medication  as he can not afford the cost of medication. He is willing to take medication for depression and withdrawal PRN. He denied suicide or homicide ideation, intention or plans. He has no evidence of psychosis.    HPI Elements:  Location:  substance abuse. Quality:  poor. Severity:  withdrawal seizure. Timing:  relapse on alcohol and quit drinking . Duration:  two days. Context:  psychosocial and relationships issue.  Past Medical History:  Past Medical History  Diagnosis Date  . Alcohol abuse   . Seizure    History reviewed. No pertinent past surgical history. Family History:  Family History  Problem Relation Age of Onset  . Diabetes Mother   . Hypertension Mother    Social History:  History  Alcohol Use  . Yes    Comment: drinks @ 9 beers to keep w/d s/s down     History  Drug Use No    History   Social History  . Marital Status: Single    Spouse Name: N/A  . Number of Children: N/A  . Years of Education: N/A   Social History Main Topics  . Smoking status: Current Every Day Smoker -- 0.25 packs/day for 4 years    Types: Cigarettes  . Smokeless tobacco: Current User  . Alcohol Use: Yes     Comment: drinks @ 9 beers to keep w/d s/s down  . Drug Use: No  . Sexual Activity: Not on file   Other Topics Concern  . None   Social History Narrative   Additional Social History:  Allergies:  No Known Allergies  Labs:  Results for orders placed or performed during the hospital encounter of 03/22/15 (from the past 48 hour(s))  CBG monitoring, ED     Status: Abnormal   Collection Time: 03/22/15  3:04 PM  Result Value Ref Range   Glucose-Capillary 142 (H) 65 - 99 mg/dL   Comment 1 Notify RN   MRSA PCR Screening     Status: None   Collection Time: 03/22/15  9:15 PM  Result Value Ref Range   MRSA by PCR NEGATIVE NEGATIVE    Comment:        The GeneXpert MRSA Assay (FDA approved for NASAL specimens only), is one component of  a comprehensive MRSA colonization surveillance program. It is not intended to diagnose MRSA infection nor to guide or monitor treatment for MRSA infections.   CBC     Status: Abnormal   Collection Time: 03/22/15  9:46 PM  Result Value Ref Range   WBC 7.9 4.0 - 10.5 K/uL   RBC 3.77 (L) 4.22 - 5.81 MIL/uL   Hemoglobin 12.5 (L) 13.0 - 17.0 g/dL   HCT 35.9 (L) 39.0 - 52.0 %   MCV 95.2 78.0 - 100.0 fL   MCH 33.2 26.0 - 34.0 pg   MCHC 34.8 30.0 - 36.0 g/dL   RDW 14.4 11.5 - 15.5 %   Platelets 169 150 - 400 K/uL  Creatinine, serum     Status: None   Collection Time: 03/22/15  9:46 PM  Result Value Ref Range   Creatinine, Ser 0.84 0.61 - 1.24 mg/dL   GFR calc non Af Amer >60 >60 mL/min   GFR calc Af Amer >60 >60 mL/min    Comment: (NOTE) The eGFR has been calculated using the CKD EPI equation. This calculation has not been validated in all clinical situations. eGFR's persistently <60 mL/min signify possible Chronic Kidney Disease.   TSH     Status: None   Collection Time: 03/22/15  9:46 PM  Result Value Ref Range   TSH 3.901 0.350 - 4.500 uIU/mL  Comprehensive metabolic panel     Status: Abnormal   Collection Time: 03/23/15  3:35 AM  Result Value Ref Range   Sodium 138 135 - 145 mmol/L   Potassium 3.2 (L) 3.5 - 5.1 mmol/L   Chloride 102 101 - 111 mmol/L   CO2 25 22 - 32 mmol/L   Glucose, Bld 98 65 - 99 mg/dL   BUN 8 6 - 20 mg/dL   Creatinine, Ser 0.70 0.61 - 1.24 mg/dL   Calcium 8.7 (L) 8.9 - 10.3 mg/dL   Total Protein 6.5 6.5 - 8.1 g/dL   Albumin 3.6 3.5 - 5.0 g/dL   AST 56 (H) 15 - 41 U/L   ALT 55 17 - 63 U/L   Alkaline Phosphatase 96 38 - 126 U/L   Total Bilirubin 1.0 0.3 - 1.2 mg/dL   GFR calc non Af Amer >60 >60 mL/min   GFR calc Af Amer >60 >60 mL/min    Comment: (NOTE) The eGFR has been calculated using the CKD EPI equation. This calculation has not been validated in all clinical situations. eGFR's persistently <60 mL/min signify possible Chronic  Kidney Disease.    Anion gap 11 5 - 15  CBC     Status: Abnormal   Collection Time: 03/23/15  3:35 AM  Result Value Ref Range   WBC 6.9 4.0 - 10.5 K/uL   RBC 3.83 (L) 4.22 - 5.81 MIL/uL  Hemoglobin 12.6 (L) 13.0 - 17.0 g/dL   HCT 36.3 (L) 39.0 - 52.0 %   MCV 94.8 78.0 - 100.0 fL   MCH 32.9 26.0 - 34.0 pg   MCHC 34.7 30.0 - 36.0 g/dL   RDW 14.2 11.5 - 15.5 %   Platelets 167 150 - 400 K/uL    Vitals: Blood pressure 140/90, pulse 58, temperature 98.8 F (37.1 C), temperature source Oral, resp. rate 15, height 5' 11"  (1.803 m), weight 78.7 kg (173 lb 8 oz), SpO2 99 %.  Risk to Self: Is patient at risk for suicide?: No Risk to Others:   Prior Inpatient Therapy:   Prior Outpatient Therapy:    Current Facility-Administered Medications  Medication Dose Route Frequency Provider Last Rate Last Dose  . folic acid (FOLVITE) tablet 1 mg  1 mg Oral Daily Allyne Gee, MD   1 mg at 03/23/15 0914  . heparin injection 5,000 Units  5,000 Units Subcutaneous 3 times per day Allyne Gee, MD   5,000 Units at 03/23/15 0548  . LORazepam (ATIVAN) tablet 1 mg  1 mg Oral Q6H PRN Allyne Gee, MD       Or  . LORazepam (ATIVAN) injection 1 mg  1 mg Intravenous Q6H PRN Allyne Gee, MD      . multivitamin with minerals tablet 1 tablet  1 tablet Oral Daily Allyne Gee, MD   1 tablet at 03/23/15 0914  . ondansetron (ZOFRAN) tablet 4 mg  4 mg Oral Q6H PRN Allyne Gee, MD       Or  . ondansetron [facility]) injection 4 mg  4 mg Intravenous Q6H PRN Allyne Gee, MD      . sodium chloride 0.9 % injection 3 mL  3 mL Intravenous Q12H Allyne Gee, MD   3 mL at 03/23/15 0915  . thiamine (VITAMIN B-1) tablet 100 mg  100 mg Oral Daily Allyne Gee, MD   100 mg at 03/23/15 8938   Or  . thiamine (B-1) injection 100 mg  100 mg Intravenous Daily Allyne Gee, MD        Musculoskeletal: Strength & Muscle Tone: decreased Gait & Station: unable to stand Patient leans: N/A  Psychiatric Specialty  Exam: Physical Exam as per history and physical  ROS depression, anxiety and  No Fever-chills, No Headache, No changes with Vision or hearing, reports vertigo No problems swallowing food or Liquids, No Chest pain, Cough or Shortness of Breath, No Abdominal pain, No Nausea or Vommitting, Bowel movements are regular, No Blood in stool or Urine, No dysuria, No new skin rashes or bruises, No new joints pains-aches,  No new weakness, tingling, numbness in any extremity, No recent weight gain or loss, No polyuria, polydypsia or polyphagia,   A full 10 point Review of Systems was done, except as stated above, all other Review of Systems were negative.  Blood pressure 140/90, pulse 58, temperature 98.8 F (37.1 C), temperature source Oral, resp. rate 15, height 5' 11"  (1.803 m), weight 78.7 kg (173 lb 8 oz), SpO2 99 %.Body mass index is 24.21 kg/(m^2).  General Appearance: Disheveled  Eye Contact::  Good  Speech:  Clear and Coherent and Slow  Volume:  Decreased  Mood:  Anxious and Depressed  Affect:  Constricted and Depressed  Thought Process:  Coherent and Goal Directed  Orientation:  Full (Time, Place, and Person)  Thought Content:  Rumination  Suicidal Thoughts:  No  Homicidal Thoughts:  No  Memory:  Immediate;   Fair Recent;   Fair Remote;   Fair  Judgement:  Impaired  Insight:  Fair  Psychomotor Activity:  Decreased  Concentration:  Good  Recall:  Good  Fund of Knowledge:Good  Language: Good  Akathisia:  Negative  Handed:  Right  AIMS (if indicated):     Assets:  Communication Skills Desire for Improvement Housing Leisure Time Physical Health Resilience Social Support Talents/Skills Transportation  ADL's:  Impaired  Cognition: WNL  Sleep:      Medical Decision Making: New problem, with additional work up planned, Review of Psycho-Social Stressors (1), Review or order clinical lab tests (1), Established Problem, Worsening (2), Review of Last Therapy Session (1),  Review of Medication Regimen & Side Effects (2) and Review of New Medication or Change in Dosage (2)  Treatment Plan Summary: patient has history of depression and alcohol dependence, recently quit drinking which leads to withdrawal seizures. He is non compliant with medication due to lack of affordability.  Daily contact with patient to assess and evaluate symptoms and progress in treatment and Medication management  Plan:  Continue Alcohol Ativan detox protocol and CIWA Start Fluoxetine 20 mg PO QD for depression Start Trazodone 50 mg PO Qhs for insomnia Patient does not meet criteria for psychiatric inpatient admission. Supportive therapy provided about ongoing stressors.  Refer to psych social service for appropriate rehab placement Appreciate psychiatric consultation and follow up as clinically required Please contact [phone] or [phone] if needs further assistance  Disposition: Refer to alcohol inpatient rehab treatment when medically stable.  Raeli Wiens,JANARDHAHA R. 03/23/2015 9:32 AM

## 2015-03-24 LAB — GLUCOSE, CAPILLARY: Glucose-Capillary: 97 mg/dL (ref 65–99)

## 2015-03-24 LAB — BASIC METABOLIC PANEL
Anion gap: 9 (ref 5–15)
BUN: 9 mg/dL (ref 6–20)
CHLORIDE: 101 mmol/L (ref 101–111)
CO2: 29 mmol/L (ref 22–32)
Calcium: 9.3 mg/dL (ref 8.9–10.3)
Creatinine, Ser: 0.87 mg/dL (ref 0.61–1.24)
GFR calc Af Amer: >60 mL/min (ref >60)
Glucose, Bld: 103 mg/dL — ABNORMAL HIGH (ref 65–99)
POTASSIUM: 3.4 mmol/L — AB (ref 3.5–5.1)
Sodium: 139 mmol/L (ref 135–145)

## 2015-03-24 LAB — HEMOGLOBIN A1C
Hgb A1c MFr Bld: 5.7 % — ABNORMAL HIGH (ref 4.8–5.6)
MEAN PLASMA GLUCOSE: 117 mg/dL

## 2015-03-24 LAB — MAGNESIUM: Magnesium: 2.1 mg/dL (ref 1.7–2.4)

## 2015-03-24 MED ORDER — POTASSIUM CHLORIDE CRYS ER 20 MEQ PO TBCR
40.0000 meq | EXTENDED_RELEASE_TABLET | Freq: Once | ORAL | Status: AC
  Administered 2015-03-24: 40 meq via ORAL
  Filled 2015-03-24: qty 2

## 2015-03-24 MED ORDER — MAGIC MOUTHWASH W/LIDOCAINE
10.0000 mL | Freq: Four times a day (QID) | ORAL | Status: DC | PRN
  Administered 2015-03-24: 10 mL via ORAL
  Filled 2015-03-24 (×3): qty 10

## 2015-03-24 MED ORDER — CHLORDIAZEPOXIDE HCL 5 MG PO CAPS
5.0000 mg | ORAL_CAPSULE | Freq: Two times a day (BID) | ORAL | Status: AC
  Administered 2015-03-24 – 2015-03-25 (×2): 5 mg via ORAL
  Filled 2015-03-24 (×2): qty 1

## 2015-03-24 NOTE — Progress Notes (Signed)
PATIENT DETAILS Name: Dwayne Bolton Age: 39 y.o. Sex: male Date of Birth: 08-06-1976 Admit Date: 03/22/2015 Admitting Physician Yevonne Pax, MD PCP:No PCP Per Patient  Subjective: Awake and alert, only mildly tremulous.  Assessment/Plan: Active Problems: Seizure: Suspected secondary from alcohol withdrawal. Continue Ativan per protocol.  MRI brain//EEG negative, doubt need for antiepileptics at present. Denies history of seizures, head injury, or intracranial infection.  Alcohol abuse: His last drink was approximately 5 days back. Is awake and alert, only very mildly tremulous. I have counseled extensively. Social worker evaluation  Depression: Claims that he divorced approximately 3 years ago, following back he started drinking and has been feeling depressed since then. Denies any suicidal or homicidal ideation. Psychiatry consultation appreciated-continue Prozac and Trazodone  Hypokalemia: Replete and recheck.  Disposition: Remain inpatient-?home in am  Antimicrobial agents  See below  Anti-infectives    None      DVT Prophylaxis: Prophylactic Heparin   Code Status: Full code   Family Communication None  Procedures: None  CONSULTS:  psychiatry  Time spent 20 minutes-Greater than 50% of this time was spent in counseling, explanation of diagnosis, planning of further management, and coordination of care.  MEDICATIONS: Scheduled Meds: . FLUoxetine  20 mg Oral Daily  . folic acid  1 mg Oral Daily  . heparin  5,000 Units Subcutaneous 3 times per day  . multivitamin with minerals  1 tablet Oral Daily  . sodium chloride  3 mL Intravenous Q12H  . thiamine  100 mg Oral Daily   Or  . thiamine  100 mg Intravenous Daily  . traZODone  50 mg Oral QHS   Continuous Infusions:  PRN Meds:.LORazepam **OR** LORazepam, ondansetron **OR** ondansetron (ZOFRAN) IV    PHYSICAL EXAM: Vital signs in last 24 hours: Filed Vitals:   03/23/15 1255  03/23/15 2055 03/24/15 0439 03/24/15 1340  BP: 134/88 125/74 132/87 126/77  Pulse: 69 61 64 73  Temp: 97.9 F (36.6 C) 98.4 F (36.9 C) 97.4 F (36.3 C) 98.4 F (36.9 C)  TempSrc: Oral Oral Oral Oral  Resp: Height:  (1.803 m)     Weight: 78.926 kg (174 lb)     SpO2: 100% 100% 100% 100%    Weight change: 0.226 kg (8 oz) Filed Weights   03/23/15 0400 03/23/15 1255  Weight: 78.7 kg (173 lb 8 oz) 78.926 kg (174 lb)   Body mass index is 24.28 kg/(m^2).   Gen Exam: Awake and alert with clear speech.  Neck: Supple, No JVD.  Chest: B/L Clear.  No rales CVS: S1 S2 Regular, no murmurs.  Abdomen: soft, BS +, non tender, non distended.  Extremities: no edema, lower extremities warm to touch. Neurologic: Non Focal.  Only Minimally tremulous. Skin: No Rash.   Wounds: N/A.    Intake/Output from previous day:  Intake/Output Summary (Last 24 hours) at 03/24/15 1456 Last data filed at 03/24/15 1230  Gross per 24 hour  Intake   1563 ml  Output   1000 ml  Net    563 ml     LAB RESULTS: CBC  Recent Labs Lab 03/22/15 0205 03/22/15 2146 03/23/15 0335  WBC 8.0 7.9 6.9  HGB 13.0 12.5* 12.6*  HCT 37.0* 35.9* 36.3*  PLT 155 169 167  MCV 93.7 95.2 94.8  MCH 32.9 33.2 32.9  MCHC 35.1 34.8 34.7  RDW 14.1 14.4 14.2  LYMPHSABS  0.7  --   --   MONOABS 1.7*  --   --   EOSABS 0.0  --   --   BASOSABS 0.1  --   --     Chemistries   Recent Labs Lab 03/22/15 0205 03/22/15 2146 03/23/15 0335 03/24/15 0503  NA 134*  --  138 139  K 3.7  --  3.2* 3.4*  CL 97*  --  102 101  CO2 23  --  25 29  GLUCOSE 92  --  98 103*  BUN 12  --  8 9  CREATININE 0.87 0.84 0.70 0.87  CALCIUM 9.4  --  8.7* 9.3  MG  --   --   --  2.1    CBG:  Recent Labs Lab 03/22/15 0131 03/22/15 1504 03/23/15 0819 03/24/15 0728  GLUCAP 86 142* 96 97    GFR Estimated Creatinine Clearance: 125 mL/min (by C-G formula based on Cr of 0.87).  Coagulation profile No results for  input(s): INR, PROTIME in the last 168 hours.  Cardiac Enzymes No results for input(s): CKMB, TROPONINI, MYOGLOBIN in the last 168 hours.  Invalid input(s): CK  Invalid input(s): POCBNP No results for input(s): DDIMER in the last 72 hours.  Recent Labs  03/22/15 2146  HGBA1C 5.7*   No results for input(s): CHOL, HDL, LDLCALC, TRIG, CHOLHDL, LDLDIRECT in the last 72 hours.  Recent Labs  03/22/15 2146  TSH 3.901   No results for input(s): VITAMINB12, FOLATE, FERRITIN, TIBC, IRON, RETICCTPCT in the last 72 hours. No results for input(s): LIPASE, AMYLASE in the last 72 hours.  Urine Studies No results for input(s): UHGB, CRYS in the last 72 hours.  Invalid input(s): UACOL, UAPR, USPG, UPH, UTP, UGL, UKET, UBIL, UNIT, UROB, ULEU, UEPI, UWBC, URBC, UBAC, CAST, UCOM, BILUA  MICROBIOLOGY: Recent Results (from the past 240 hour(s))  MRSA PCR Screening     Status: None   Collection Time: 03/22/15  9:15 PM  Result Value Ref Range Status   MRSA by PCR NEGATIVE NEGATIVE Final    Comment:        The GeneXpert MRSA Assay (FDA approved for NASAL specimens only), is one component of a comprehensive MRSA colonization surveillance program. It is not intended to diagnose MRSA infection nor to guide or monitor treatment for MRSA infections.     RADIOLOGY STUDIES/RESULTS: Ct Head Wo Contrast  03/22/2015   CLINICAL DATA:  Acute onset of seizure.  Initial encounter.  EXAM: CT HEAD WITHOUT CONTRAST  TECHNIQUE: Contiguous axial images were obtained from the base of the skull through the vertex without intravenous contrast.  COMPARISON:  None.  FINDINGS: There is no evidence of acute infarction, mass lesion, or intra- or extra-axial hemorrhage on CT.  The posterior fossa, including the cerebellum, brainstem and fourth ventricle, is within normal limits. The third and lateral ventricles, and basal ganglia are unremarkable in appearance. The cerebral hemispheres are symmetric in appearance, with  normal gray-white differentiation. No mass effect or midline shift is seen.  There is no evidence of fracture; visualized osseous structures are unremarkable in appearance. The visualized portions of the orbits are within normal limits. The paranasal sinuses and mastoid air cells are well-aerated. No significant soft tissue abnormalities are seen.  IMPRESSION: Unremarkable noncontrast CT of the head.   Electronically Signed   By: Roanna Raider M.D.   On: 03/22/2015 19:35   Mr Laqueta Jean ZO Contrast  03/23/2015   CLINICAL DATA:  Multiple seizures after alcohol cessation.  EXAM: MRI HEAD WITHOUT AND WITH CONTRAST  TECHNIQUE: Multiplanar, multiecho pulse sequences of the brain and surrounding structures were obtained without and with intravenous contrast.  CONTRAST:  16mL MULTIHANCE GADOBENATE DIMEGLUMINE 529 MG/ML IV SOLN  COMPARISON:  Head CT 03/22/2015  FINDINGS: The brain has a normal appearance on all pulse sequences without evidence of malformation, atrophy, old or acute infarction, mass lesion, hemorrhage, hydrocephalus or extra-axial collection. No pituitary mass. No fluid in the sinuses, middle ears or mastoids. No skull or skullbase lesion. There is flow in the major vessels at the base of the brain. Major venous sinuses show flow. After contrast administration, no abnormal enhancement occurs.  IMPRESSION: Normal examination.  No lesions seen to explain seizures.   Electronically Signed   By: Paulina FusiMark  Shogry M.D.   On: 03/23/2015 10:38    Jeoffrey MassedGHIMIRE,Sharlynn Seckinger, MD  [facility] Pager:[phone]  If 7PM-7AM, please contact night-coverage [email or web address] Password [email or web address] 03/24/2015, 2:56 PM   LOS: 2 days

## 2015-03-25 LAB — BASIC METABOLIC PANEL
Anion gap: 8 (ref 5–15)
BUN: 13 mg/dL (ref 6–20)
CALCIUM: 9.1 mg/dL (ref 8.9–10.3)
CO2: 30 mmol/L (ref 22–32)
Chloride: 101 mmol/L (ref 101–111)
Creatinine, Ser: 1.01 mg/dL (ref 0.61–1.24)
Glucose, Bld: 108 mg/dL — ABNORMAL HIGH (ref 65–99)
POTASSIUM: 3.8 mmol/L (ref 3.5–5.1)
Sodium: 139 mmol/L (ref 135–145)

## 2015-03-25 LAB — GLUCOSE, CAPILLARY: GLUCOSE-CAPILLARY: 100 mg/dL — AB (ref 65–99)

## 2015-03-25 MED ORDER — FOLIC ACID 1 MG PO TABS
1.0000 mg | ORAL_TABLET | Freq: Every day | ORAL | Status: DC
Start: 2015-03-25 — End: 2015-12-28

## 2015-03-25 MED ORDER — TRAZODONE HCL 50 MG PO TABS: 50.0000 mg | ORAL_TABLET | Freq: Every day | ORAL | Status: DC

## 2015-03-25 MED ORDER — MAGIC MOUTHWASH W/LIDOCAINE: 10.0000 mL | Freq: Four times a day (QID) | ORAL | Status: DC | PRN

## 2015-03-25 MED ORDER — ONE-A-DAY MENS PO TABS: 1.0000 | ORAL_TABLET | Freq: Every day | ORAL | Status: DC

## 2015-03-25 MED ORDER — VITAMIN B-1 100 MG PO TABS: 100.0000 mg | ORAL_TABLET | Freq: Every day | ORAL | Status: DC

## 2015-03-25 MED ORDER — FLUOXETINE HCL 20 MG PO CAPS: 20.0000 mg | ORAL_CAPSULE | Freq: Every day | ORAL | Status: DC

## 2015-03-25 NOTE — Clinical Social Work Note (Signed)
Clinical Social Work Assessment  Patient Details  Name: Dwayne Bolton MRN: [id number] Date of Birth: 05-21-1976  Date of referral:  03/25/15               Reason for consult:  Substance Use/ETOH Abuse                Permission sought to share information with:    Permission granted to share information::     Name::        Agency::     Relationship::     Contact Information:     Housing/Transportation Living arrangements for the past 2 months:  Single Family Home Source of Information:  Patient Patient Interpreter Needed:    Criminal Activity/Legal Involvement Pertinent to Current Situation/Hospitalization:    Significant Relationships:    Lives with:  Siblings Do you feel safe going back to the place where you live?    Need for family participation in patient care:     Care giving concerns:  No caregiver   Facilities manager / plan:  CSW met with pt at bedside to assess for outpatient services.  CSW prompted pt to discuss history, including treatment services, drug/alcohol services and current needs.  CSW prompted pt to discuss current alcohol substance use/abuse and provided Outpatient treatment sources that pt may explore at discharge that may help with psychiatric medications, counseling and substance abuse/alcohol related services.  Employment status:  Unemployed Forensic scientist:   (no insurance) PT Recommendations:  Not assessed at this time Information / Referral to community resources:  Outpatient Substance Abuse Treatment Options  Patient/Family's Response to care:  Pt appeared anxious and provided conflicting information.  Pt stated that he is living with his brother and is unemployed at this time.  Pt stated that he has no health insurance.  Pt stated that he does not need any drug or alcohol treatment but did agree to take resources stating "whatever will help".  Pt stated he currently has no drivers license as it has been suspended.  Pt stated that he was  aware of [facility] where he may go for his psychiatric medications and that he has been there before.  Pt agreed to follow up with getting a new sponsor as he had one when he lived in [place].  Patient/Family's Understanding of and Emotional Response to Diagnosis, Current Treatment, and Prognosis:  Pt appeared apprehensive and in a hurry to leave.  His brother in law had arrived to take him home.  Pt stated that his health is fine other than his recent hospital detox but did appear to understand that he needed some alcohol related out patient treatment at this time.   Emotional Assessment Appearance:  Appears stated age Attitude/Demeanor/Rapport:  Lethargic, Apprehensive Affect (typically observed):  Anxious, Apprehensive Orientation:  Oriented to Self, Oriented to Place, Oriented to  Time, Oriented to Situation Alcohol / Substance use:  Alcohol Use Psych involvement (Current and /or in the community):  No (Comment)  Discharge Needs  Concerns to be addressed:  Substance Abuse Concerns, Medication Concerns Readmission within the last 30 days:  No Current discharge risk:    Barriers to Discharge:  No Barriers Identified   Carlean Jews, LCSW 03/25/2015, 2:45 PM

## 2015-03-25 NOTE — Discharge Summary (Signed)
PATIENT DETAILS Name: Dwayne IsaacsROBERT Hagey Age: 39 y.o. Sex: male Date of Birth: 10/05/1976 MRN: [id number]. Admitting Physician: Yevonne PaxSaadat A Khan, MD PCP:No PCP Per Patient  Admit Date: 03/22/2015 Discharge date: 03/25/2015  Recommendations for Outpatient Follow-up:  1. Continue to counsel regarding abstinence from alcohol  2. Age appropriate general health maintenance   PRIMARY DISCHARGE DIAGNOSIS:  Principal Problem:   Alcohol withdrawal seizure Active Problems:   Alcohol dependence   Seizures      PAST MEDICAL HISTORY: Past Medical History  Diagnosis Date  . Alcohol abuse   . Seizure     DISCHARGE MEDICATIONS: Current Discharge Medication List    START taking these medications   Details  Alum & Mag Hydroxide-Simeth (MAGIC MOUTHWASH W/LIDOCAINE) SOLN Take 10 mLs by mouth 4 (four) times daily as needed for mouth pain. Qty: 100 mL, Refills: 0    FLUoxetine (PROZAC) 20 MG capsule Take 1 capsule (20 mg total) by mouth daily. Qty: 30 capsule, Refills: 0    traZODone (DESYREL) 50 MG tablet Take 1 tablet (50 mg total) by mouth at bedtime. Qty: 30 tablet, Refills: 0      CONTINUE these medications which have CHANGED   Details  folic acid (FOLVITE) 1 MG tablet Take 1 tablet (1 mg total) by mouth daily. Qty: 30 tablet, Refills: 0    multivitamin (ONE-A-DAY MEN'S) TABS tablet Take 1 tablet by mouth daily. Qty: 30 tablet, Refills: 0    thiamine (VITAMIN B-1) 100 MG tablet Take 1 tablet (100 mg total) by mouth daily. Qty: 30 tablet, Refills: 0      STOP taking these medications     chlordiazePOXIDE (LIBRIUM) 25 MG capsule         ALLERGIES:  No Known Allergies  BRIEF HPI:  See H&P, Labs, Consult and Test reports for all details in brief, patient was admitted for evaluation of seizures   CONSULTATIONS:   None  PERTINENT RADIOLOGIC STUDIES: Ct Head Wo Contrast  03/22/2015   CLINICAL DATA:  Acute onset of seizure.  Initial encounter.  EXAM: CT HEAD WITHOUT CONTRAST   TECHNIQUE: Contiguous axial images were obtained from the base of the skull through the vertex without intravenous contrast.  COMPARISON:  None.  FINDINGS: There is no evidence of acute infarction, mass lesion, or intra- or extra-axial hemorrhage on CT.  The posterior fossa, including the cerebellum, brainstem and fourth ventricle, is within normal limits. The third and lateral ventricles, and basal ganglia are unremarkable in appearance. The cerebral hemispheres are symmetric in appearance, with normal gray-white differentiation. No mass effect or midline shift is seen.  There is no evidence of fracture; visualized osseous structures are unremarkable in appearance. The visualized portions of the orbits are within normal limits. The paranasal sinuses and mastoid air cells are well-aerated. No significant soft tissue abnormalities are seen.  IMPRESSION: Unremarkable noncontrast CT of the head.   Electronically Signed   By: Roanna RaiderJeffery  Chang M.D.   On: 03/22/2015 19:35   Mr Laqueta JeanBrain W LKWo Contrast  03/23/2015   CLINICAL DATA:  Multiple seizures after alcohol cessation.  EXAM: MRI HEAD WITHOUT AND WITH CONTRAST  TECHNIQUE: Multiplanar, multiecho pulse sequences of the brain and surrounding structures were obtained without and with intravenous contrast.  CONTRAST:  16mL MULTIHANCE GADOBENATE DIMEGLUMINE 529 MG/ML IV SOLN  COMPARISON:  Head CT 03/22/2015  FINDINGS: The brain has a normal appearance on all pulse sequences without evidence of malformation, atrophy, old or acute infarction, mass lesion, hemorrhage, hydrocephalus or extra-axial collection.  No pituitary mass. No fluid in the sinuses, middle ears or mastoids. No skull or skullbase lesion. There is flow in the major vessels at the base of the brain. Major venous sinuses show flow. After contrast administration, no abnormal enhancement occurs.  IMPRESSION: Normal examination.  No lesions seen to explain seizures.   Electronically Signed   By: Paulina Fusi M.D.   On:  03/23/2015 10:38     PERTINENT LAB RESULTS: CBC:  Recent Labs  03/22/15 2146 03/23/15 0335  WBC 7.9 6.9  HGB 12.5* 12.6*  HCT 35.9* 36.3*  PLT 169 167   CMET CMP     Component Value Date/Time   NA 139 03/25/2015 0503   K 3.8 03/25/2015 0503   CL 101 03/25/2015 0503   CO2 30 03/25/2015 0503   GLUCOSE 108* 03/25/2015 0503   BUN 13 03/25/2015 0503   CREATININE 1.01 03/25/2015 0503   CALCIUM 9.1 03/25/2015 0503   PROT 6.5 03/23/2015 0335   ALBUMIN 3.6 03/23/2015 0335   AST 56* 03/23/2015 0335   ALT 55 03/23/2015 0335   ALKPHOS 96 03/23/2015 0335   BILITOT 1.0 03/23/2015 0335   GFRNONAA >60 03/25/2015 0503   GFRAA >60 03/25/2015 0503    GFR Estimated Creatinine Clearance: 107.7 mL/min (by C-G formula based on Cr of 1.01). No results for input(s): LIPASE, AMYLASE in the last 72 hours. No results for input(s): CKTOTAL, CKMB, CKMBINDEX, TROPONINI in the last 72 hours. Invalid input(s): POCBNP No results for input(s): DDIMER in the last 72 hours.  Recent Labs  03/22/15 2146  HGBA1C 5.7*   No results for input(s): CHOL, HDL, LDLCALC, TRIG, CHOLHDL, LDLDIRECT in the last 72 hours.  Recent Labs  03/22/15 2146  TSH 3.901   No results for input(s): VITAMINB12, FOLATE, FERRITIN, TIBC, IRON, RETICCTPCT in the last 72 hours. Coags: No results for input(s): INR in the last 72 hours.  Invalid input(s): PT Microbiology: Recent Results (from the past 240 hour(s))  MRSA PCR Screening     Status: None   Collection Time: 03/22/15  9:15 PM  Result Value Ref Range Status   MRSA by PCR NEGATIVE NEGATIVE Final    Comment:        The GeneXpert MRSA Assay (FDA approved for NASAL specimens only), is one component of a comprehensive MRSA colonization surveillance program. It is not intended to diagnose MRSA infection nor to guide or monitor treatment for MRSA infections.      BRIEF HOSPITAL COURSE:  Seizure: Suspected secondary from alcohol withdrawal. Was admitted  and started on  Ativan per protocol. MRI brain//EEG negative, doubt need for antiepileptics at present. Denies history of seizures, head injury, or intracranial infection. Patient was advised not to drive, operate heavy machinery, engage in activities at heights, engage in high-speed water sports until cleared by his primary M.D. Please note, patient's last drink was a approximately 6-7 days prior to this admission. By day of discharge, patient was awake alert, ambulated in the hallway without any tremors. He was in fact requesting discharge early in the morning.  Alcohol abuse: His last drink was approximately 6 days back. Is awake and alert, with no tremors. I have counseled extensively. Social worker evaluation for patient resources prior to discharge.  Depression: Claims that he divorced approximately 3 years ago, following back he started drinking and has been feeling depressed since then. Denies any suicidal or homicidal ideation. Psychiatry consultation appreciated-continue Prozac and Trazodone on discharge.  Hypokalemia: Repleted   TODAY-DAY OF DISCHARGE:  Subjective:   DAYTON KENLEY today has no headache,no chest abdominal pain,no new weakness tingling or numbness, feels much better wants to go home today.  Objective:   Blood pressure 138/82, pulse 59, temperature 98.4 F (36.9 C), temperature source Oral, resp. rate 18, height  (1.803 m), weight 78.926 kg (174 lb), SpO2 100 %.  Intake/Output Summary (Last 24 hours) at 03/25/15 0953 Last data filed at 03/24/15 1735  Gross per 24 hour  Intake   1120 ml  Output      0 ml  Net   1120 ml   Filed Weights   03/23/15 0400 03/23/15 1255  Weight: 78.7 kg (173 lb 8 oz) 78.926 kg (174 lb)    Exam Awake Alert, Oriented *3, No new F.N deficits, Normal affect .AT,PERRAL Supple Neck,No JVD, No cervical lymphadenopathy appriciated.  Symmetrical Chest wall movement, Good air movement bilaterally, CTAB RRR,No Gallops,Rubs or new  Murmurs, No Parasternal Heave +ve B.Sounds, Abd Soft, Non tender, No organomegaly appriciated, No rebound -guarding or rigidity. No Cyanosis, Clubbing or edema, No new Rash or bruise  DISCHARGE CONDITION: Stable  DISPOSITION: Home  DISCHARGE INSTRUCTIONS:    Activity:  As tolerated   Diet recommendation: Regular Diet  Discharge Instructions    Diet general    Complete by:  As directed      Driving Restrictions    Complete by:  As directed   No driving, no pitting heavy machinery, no activities at heights, no engaging in high-speed water sports till you see your primary MD and are cleared for such atctivities     Increase activity slowly    Complete by:  As directed            Follow-up Information    Follow up with [facility]    . Schedule an appointment as soon as possible for a visit in 1 week.   Why:  please call on 03/26/15 and make a appointment to see a MD in 1 week. Please let them know you were in the hospital.   Contact information:   [address] [place] [address] [phone]      Total Time spent on discharge equals 25 minutes.  SignedJeoffrey Massed 03/25/2015 9:53 AM

## 2015-03-25 NOTE — Discharge Instructions (Signed)
DO NOT DRIVE OR OPERATE HEAVY MACHINERY.  DO NOT PERFORM ACTIVITIES AT HEIGHTS DO NOT ENGAGE IN WATER SPORTS  PLEASE SEE YOUR PRIMARY MD-AND ASK IF CLEARED FOR ABOVE ACTIVITIES-BEFORE ENGAGING IN THEM.    Driving and Equipment Restrictions Some medical problems make it dangerous to drive, ride a bike, or use machines. Some of these problems are:  A hard blow to the head (concussion).  Passing out (fainting).  Twitching and shaking (seizures).  Low blood sugar.  Taking medicine to help you relax (sedatives).  Taking pain medicines.  Wearing an eye patch.  Wearing splints. This can make it hard to use parts of your body that you need to drive safely. HOME CARE   Do not drive until your doctor says it is okay.  Do not use machines until your doctor says it is okay. You may need a form signed by your doctor (medical release) before you can drive again. You may also need this form before you do other tasks where you need to be fully alert. MAKE SURE YOU:  Understand these instructions.  Will watch your condition.  Will get help right away if you are not doing well or get worse. Document Released: 11/13/2004 Document Revised: 12/29/2011 Document Reviewed: 02/13/2010 [facility] Patient Information 2015 [employer], [place]. This information is not intended to replace advice given to you by your health care provider. Make sure you discuss any questions you have with your health care provider.

## 2015-03-25 NOTE — Progress Notes (Signed)
Patient discharged home, all discharge medications and instructions reviewed and questions answered.  Patient declines wheelchair assistance to vehicle, states will ambulate.

## 2015-04-26 ENCOUNTER — Emergency Department (HOSPITAL_COMMUNITY)
Admission: EM | Admit: 2015-04-26 | Discharge: 2015-04-26 | Disposition: A | Discharge status: Home / Self Care | Payer: Self-pay | Attending: Emergency Medicine | Admitting: Emergency Medicine

## 2015-04-26 ENCOUNTER — Encounter (HOSPITAL_COMMUNITY): Payer: Self-pay | Admitting: *Deleted

## 2015-04-26 DIAGNOSIS — F10230 Alcohol dependence with withdrawal, uncomplicated: Secondary | ICD-10-CM | POA: Insufficient documentation

## 2015-04-26 DIAGNOSIS — Z720 Tobacco use: Secondary | ICD-10-CM | POA: Insufficient documentation

## 2015-04-26 DIAGNOSIS — F10239 Alcohol dependence with withdrawal, unspecified: Secondary | ICD-10-CM

## 2015-04-26 DIAGNOSIS — G40909 Epilepsy, unspecified, not intractable, without status epilepticus: Secondary | ICD-10-CM | POA: Insufficient documentation

## 2015-04-26 DIAGNOSIS — Z79899 Other long term (current) drug therapy: Secondary | ICD-10-CM | POA: Insufficient documentation

## 2015-04-26 LAB — BASIC METABOLIC PANEL
Anion gap: 13 (ref 5–15)
BUN: 6 mg/dL (ref 6–20)
CO2: 28 mmol/L (ref 22–32)
Calcium: 9.3 mg/dL (ref 8.9–10.3)
Chloride: 101 mmol/L (ref 101–111)
Creatinine, Ser: 1.06 mg/dL (ref 0.61–1.24)
GFR calc Af Amer: >60 mL/min (ref >60)
GFR calc non Af Amer: >60 mL/min (ref >60)
Glucose, Bld: 94 mg/dL (ref 65–99)
Potassium: 4.1 mmol/L (ref 3.5–5.1)
Sodium: 142 mmol/L (ref 135–145)

## 2015-04-26 LAB — CBC WITH DIFFERENTIAL/PLATELET
Basophils Absolute: 0.1 10*3/uL (ref 0.0–0.1)
Basophils Relative: 1 % (ref 0–1)
Eosinophils Absolute: 0 10*3/uL (ref 0.0–0.7)
Eosinophils Relative: 0 % (ref 0–5)
HCT: 46.1 % (ref 39.0–52.0)
Hemoglobin: 16.7 g/dL (ref 13.0–17.0)
Lymphocytes Relative: 22 % (ref 12–46)
Lymphs Abs: 2.8 10*3/uL (ref 0.7–4.0)
MCH: 32.9 pg (ref 26.0–34.0)
MCHC: 36.2 g/dL — ABNORMAL HIGH (ref 30.0–36.0)
MCV: 90.9 fL (ref 78.0–100.0)
Monocytes Absolute: 1.3 10*3/uL — ABNORMAL HIGH (ref 0.1–1.0)
Monocytes Relative: 10 % (ref 3–12)
Neutro Abs: 8.5 10*3/uL — ABNORMAL HIGH (ref 1.7–7.7)
Neutrophils Relative %: 67 % (ref 43–77)
Platelets: 307 10*3/uL (ref 150–400)
RBC: 5.07 MIL/uL (ref 4.22–5.81)
RDW: 13.2 % (ref 11.5–15.5)
WBC: 12.6 10*3/uL — ABNORMAL HIGH (ref 4.0–10.5)

## 2015-04-26 MED ORDER — SODIUM CHLORIDE 0.9 % IV BOLUS (SEPSIS)
1000.0000 mL | Freq: Once | INTRAVENOUS | Status: AC
  Administered 2015-04-26: 1000 mL via INTRAVENOUS

## 2015-04-26 MED ORDER — DIAZEPAM 5 MG/ML IJ SOLN
7.5000 mg | Freq: Once | INTRAMUSCULAR | Status: AC
  Administered 2015-04-26: 7.5 mg via INTRAVENOUS
  Filled 2015-04-26: qty 2

## 2015-04-26 NOTE — ED Notes (Signed)
Dr. Kohut at bedside 

## 2015-04-26 NOTE — ED Notes (Addendum)
Pt states he has seizures when he tries to stop drinking alcohol.  States last drank yesterday and today felt a seizure coming on.  Next thing he knew someone was waking him up from laying on the ground.  Pt states he used to drink 1/2 gal liquor per day, but now only drinks 2 40 oz beers per day.  C/o pain to forehead.  States was on librium and "some seizure med I can't pronounce" [facility]

## 2015-04-26 NOTE — Discharge Instructions (Signed)
Alcohol Use Disorder °Alcohol use disorder is a mental disorder. It is not a one-time incident of heavy drinking. Alcohol use disorder is the excessive and uncontrollable use of alcohol over time that leads to problems with functioning in one or more areas of daily living. People with this disorder risk harming themselves and others when they drink to excess. Alcohol use disorder also can cause other mental disorders, such as mood and anxiety disorders, and serious physical problems. People with alcohol use disorder often misuse other drugs.  °Alcohol use disorder is common and widespread. Some people with this disorder drink alcohol to cope with or escape from negative life events. Others drink to relieve chronic pain or symptoms of mental illness. People with a family history of alcohol use disorder are at higher risk of losing control and using alcohol to excess.  °SYMPTOMS  °Signs and symptoms of alcohol use disorder may include the following:  °· Consumption of alcohol in larger amounts or over a longer period of time than intended. °· Multiple unsuccessful attempts to cut down or control alcohol use.   °· A great deal of time spent obtaining alcohol, using alcohol, or recovering from the effects of alcohol (hangover). °· A strong desire or urge to use alcohol (cravings).   °· Continued use of alcohol despite problems at work, school, or home because of alcohol use.   °· Continued use of alcohol despite problems in relationships because of alcohol use. °· Continued use of alcohol in situations when it is physically hazardous, such as driving a car. °· Continued use of alcohol despite awareness of a physical or psychological problem that is likely related to alcohol use. Physical problems related to alcohol use can involve the brain, heart, liver, stomach, and intestines. Psychological problems related to alcohol use include intoxication, depression, anxiety, psychosis, delirium, and dementia.   °· The need for  increased amounts of alcohol to achieve the same desired effect, or a decreased effect from the consumption of the same amount of alcohol (tolerance). °· Withdrawal symptoms upon reducing or stopping alcohol use, or alcohol use to reduce or avoid withdrawal symptoms. Withdrawal symptoms include: °¨ Racing heart. °¨ Hand tremor. °¨ Difficulty sleeping. °¨ Nausea. °¨ Vomiting. °¨ Hallucinations. °¨ Restlessness. °¨ Seizures. °DIAGNOSIS °Alcohol use disorder is diagnosed through an assessment by your health care provider. Your health care provider may start by asking three or four questions to screen for excessive or problematic alcohol use. To confirm a diagnosis of alcohol use disorder, at least two symptoms must be present within a 12-month period. The severity of alcohol use disorder depends on the number of symptoms: °· Mild--two or three. °· Moderate--four or five. °· Severe--six or more. °Your health care provider may perform a physical exam or use results from lab tests to see if you have physical problems resulting from alcohol use. Your health care provider may refer you to a mental health professional for evaluation. °TREATMENT  °Some people with alcohol use disorder are able to reduce their alcohol use to low-risk levels. Some people with alcohol use disorder need to quit drinking alcohol. When necessary, mental health professionals with specialized training in substance use treatment can help. Your health care provider can help you decide how severe your alcohol use disorder is and what type of treatment you need. The following forms of treatment are available:  °· Detoxification. Detoxification involves the use of prescription medicines to prevent alcohol withdrawal symptoms in the first week after quitting. This is important for people with a history of symptoms   of withdrawal and for heavy drinkers who are likely to have withdrawal symptoms. Alcohol withdrawal can be dangerous and, in severe cases, cause  death. Detoxification is usually provided in a hospital or in-patient substance use treatment facility. °· Counseling or talk therapy. Talk therapy is provided by substance use treatment counselors. It addresses the reasons people use alcohol and ways to keep them from drinking again. The goals of talk therapy are to help people with alcohol use disorder find healthy activities and ways to cope with life stress, to identify and avoid triggers for alcohol use, and to handle cravings, which can cause relapse. °· Medicines. Different medicines can help treat alcohol use disorder through the following actions: °¨ Decrease alcohol cravings. °¨ Decrease the positive reward response felt from alcohol use. °¨ Produce an uncomfortable physical reaction when alcohol is used (aversion therapy). °· Support groups. Support groups are run by people who have quit drinking. They provide emotional support, advice, and guidance. °These forms of treatment are often combined. Some people with alcohol use disorder benefit from intensive combination treatment provided by specialized substance use treatment centers. Both inpatient and outpatient treatment programs are available. °Document Released: 11/13/2004 Document Revised: 02/20/2014 Document Reviewed: 01/13/2013 °ExitCare® Patient Information ©2015 [employer]. This information is not intended to replace advice given to you by your health care provider. Make sure you discuss any questions you have with your health care provider. ° °Finding Treatment for Alcohol and Drug Addiction °It can be hard to find the right place to get professional treatment. Here are some important things to consider: °· There are different types of treatment to choose from. °· Some programs are live-in (residential) while others are not (outpatient). Sometimes a combination is offered. °· No single type of program is right for everyone. °· Most treatment programs involve a combination of education,  counseling, and a 12-step, spiritually-based approach. °· There are non-spiritually based programs (not 12-step). °· Some treatment programs are government sponsored. They are geared for patients without private insurance. °· Treatment programs can vary in many respects such as: °¨ Cost and types of insurance accepted. °¨ Types of on-site medical services offered. °¨ Length of stay, setting, and size. °¨ Overall philosophy of treatment.  °A person may need specialized treatment or have needs not addressed by all programs. For example, adolescents need treatment appropriate for their age. Other people have secondary disorders that must be managed as well. Secondary conditions can include mental illness, such as depression or diabetes. Often, a period of detoxification from alcohol or drugs is needed. This requires medical supervision and not all programs offer this. °THINGS TO CONSIDER WHEN SELECTING A TREATMENT PROGRAM  °· Is the program certified by the appropriate government agency? Even private programs must be certified and employ certified professionals. °· Does the program accept your insurance? If not, can a payment plan be set up? °· Is the facility clean, organized, and well run? Do they allow you to speak with graduates who can share their treatment experience with you? Can you tour the facility? Can you meet with staff? °· Does the program meet the full range of individual needs? °· Does the treatment program address sexual orientation and physical disabilities? Do they provide age, gender, and culturally appropriate treatment services? °· Is treatment available in languages other than English? °· Is long-term aftercare support or guidance encouraged and provided? °· Is assessment of an individual's treatment plan ongoing to ensure it meets changing needs? °· Does the program use strategies to   encourage reluctant patients to remain in treatment long enough to increase the likelihood of success?  Does  the program offer counseling (individual or group) and other behavioral therapies?  Does the program offer medicine as part of the treatment regimen, if needed?  Is there ongoing monitoring of possible relapse? Is there a defined relapse prevention program? Are services or referrals offered to family members to ensure they understand addiction and the recovery process? This would help them support the recovering individual.  Are 12-step meetings held at the center or is transport available for patients to attend outside meetings? In countries outside of the [place]. and [place], Magazine features editor for contact information for services in your area. Document Released: 09/04/2005 Document Revised: 12/29/2011 Document Reviewed: 03/16/2008 [facility] Patient Information 2015 [place], [place]. This information is not intended to replace advice given to you by your health care provider. Make sure you discuss any questions you have with your health care provider.   Emergency Department Resource Guide 1) Find a Doctor and Pay Out of Pocket Although you won't have to find out who is covered by your insurance plan, it is a good idea to ask around and get recommendations. You will then need to call the office and see if the doctor you have chosen will accept you as a new patient and what types of options they offer for patients who are self-pay. Some doctors offer discounts or will set up payment plans for their patients who do not have insurance, but you will need to ask so you aren't surprised when you get to your appointment.  2) Contact Your Local Health Department Not all health departments have doctors that can see patients for sick visits, but many do, so it is worth a call to see if yours does. If you don't know where your local health department is, you can check in your phone book. The CDC also has a tool to help you locate your state's health department, and many state websites also have listings of all  of their local health departments.  3) Find a Walk-in Clinic If your illness is not likely to be very severe or complicated, you may want to try a walk in clinic. These are popping up all over the country in pharmacies, drugstores, and shopping centers. They're usually staffed by nurse practitioners or physician assistants that have been trained to treat common illnesses and complaints. They're usually fairly quick and inexpensive. However, if you have serious medical issues or chronic medical problems, these are probably not your best option.  No Primary Care Doctor: - Call Health Connect at  [phone] - they can help you locate a primary care doctor that  accepts your insurance, provides certain services, etc. - Physician Referral Service- [phone]  Chronic Pain Problems: Organization         Address  Phone   Notes  [facility]  [phone] Patients need to be referred by their primary care doctor.   Medication Assistance: Organization         Address  Phone   Notes  [facility] Medication [facility] [address] [place] [place], [place] [address] [phone] --Must be a resident of [facility] -- Must have NO insurance coverage whatsoever (no Medicaid/ Medicare, etc.) -- The pt. MUST have a primary care doctor that directs their care regularly and follows them in the community   [facility]  [phone]   [address]  [phone]  Agencies that provide inexpensive medical care: Organization         Address  Phone   Notes  [facility]  [phone]   [facility]    [phone]   [facility] [address] [place], [place] [address] [phone]   [facility] [place] [address], [place] [phone]) [phone]   [facility]anned Parenthood    [phone]) [phone]   [facility]    [phone]) [phone]   [facility] and [facility]  [address],  [place] Phone:  [phone]) [phone], Fax:  [phone](336) [phone] Hours of Operation:  9 am - 6 pm, M-F.  Also accepts Medicaid/Medicare and self-pay.  [facility]  [address], [place] Phone: [phone]) [phone], Fax: [phone]. Hours of Operation:  8:30 am - 5:30 pm, M-F.  Also accepts Medicaid and self-pay.  [facility] [place] [address], [place] Phone: [phone]) [phone]   [facility] [address], [place], [place] [phone], Ext. [phone] Mondays & Thursdays: 7-9 AM.  First 15 patients are seen on a first come, first serve basis.    Medicaid-accepting [facility] Providers:  Organization         Address  Phone   Notes  [facility] [address], House [phone]) [phone] Also accepts self-pay patients.  [facility] [address], [place]  [phone]) [phone]   [facility] [address], [place] [phone]   [facility] [address] [place] [address], [place] [phone]) [phone]   Renaye RakersVeita Bland [address], [place]   [phone] Only accepts [place] Access [place] patients after they have their name applied to their card.   Self-Pay (no insurance) in [facility]:  Organization         Address  Phone   Notes  Sickle Cell Patients, [facility] [address], [place] [phone]   [facility] [address], [place] [phone]) [phone]   [facility] [place]  [address], [place] [phone]   [facility]/Dr. Osei-Bonsu  [address] [place] [address], [place] or [phone], [place] [phone] Phone number for both [place] and [place] locations is the same.  [facility] [address], [place] [phone]   [facility] [place] [address] [place] [address], [place] or [address] [phone]   [facility] [address], [place] [phone]) [phone], phone; [phone](336) [phone], fax Sees patients 1st and 3rd Saturday of every month.  Must not qualify for public or private insurance (i.e. Medicaid, Medicare, [place] Health Choice, Veterans' Benefits)  Household income should be no more than 200% of the poverty level The clinic cannot treat you if you are pregnant or think you are pregnant  Sexually transmitted diseases are not treated at the clinic.    Dental Care: Organization         Address  Phone  Notes  [facility] [address], [place] [phone] Accepts children up to age 37 who are enrolled in [place] or [place] Health Choice; pregnant women with a Medicaid card; and children who have applied for Medicaid or  [place] Health Choice, but were declined, whose parents can pay a reduced fee at time of service.  [facility]  [address], [place] [phone] Accepts children up to age 52 who are enrolled in [place] or [place] Health Choice; pregnant women with a Medicaid card; and children who have applied for Medicaid or [place] Health Choice, but were declined, whose parents can pay a reduced fee at time of service.  [facility]  [address] [place], [place] [phone] Patients are seen by appointment only. Walk-ins are not accepted. [facility] will see patients 48 years of age and older. Monday - Tuesday (8am-5pm) Most Wednesdays (8:30-5pm) $30 per visit, cash only  [facility]  [address], [facility] [phone] Patients are seen by appointment only. Walk-ins are not accepted. [facility] will see patients 81 years of age and older. One Wednesday Evening (Monthly: Volunteer Based).  $30 per visit, cash only  [employer]   [phone] for adults; Children under age 28, call Graduate Pediatric Dentistry at [phone]. Children aged 64-14, please call [phone] to request a pediatric application.  Dental services are provided in all areas of dental care including fillings, crowns and bridges, complete and partial dentures, implants, gum treatment, root canals, and extractions. Preventive care is also provided. Treatment is provided to both adults and children. Patients are selected via a lottery and there is often a waiting list.   [facility] [address] [place]  [phone] [email or web address]   [facility] [address] [place], [place] [phone], Ext. [phone] Second and Fourth Thursday of each month, opens at 6:30 AM; Clinic ends at 9 AM.  Patients are seen on a first-come first-served basis, and a limited number are seen during each clinic.   [facility]  [address] [place], [place] [phone]   Eligibility Requirements You must have lived in [place], [place], or [place] counties for at least the last three months.   You cannot be eligible for state or federal sponsored [place], including [employer], [place], or [employer].   You generally cannot be eligible for healthcare insurance through your employer.    How to apply: Eligibility screenings are held every Tuesday and Wednesday afternoon from 1:00 pm until 4:00 pm. You do not need an appointment for the interview!  [facility] [address], [place], [place] [phone]   [facility] Health Department  [phone]   [facility] Health Department  [phone]   [facility] Health Department  [phone]    Behavioral Health Resources in the Community: Intensive Outpatient Programs Organization         Address  Phone  Notes  [facility] [address], [place], [place] [phone]   [facility] [address], [place], [place] [phone]   [facility] [address], [place], [place]  [phone]   [facility] [address]  [place], [place] [phone] or [phone]   Substance Abuse Resources Organization         Address  Phone  Notes  Al[facility] Drug Services  [phone]   [facility]  [phone]   [facility] [place]  [phone]   Floydene Flock  [phone]   Residential & Outpatient Substance Abuse Program  [phone]  Psychological Services Organization         Address  Phone  Notes  [facility]  [phone]- [phone]   [facility]  [phone]   [facility] [phone], [place] [phone] or [phone]    Mobile Crisis Teams Organization         Address  Phone  Notes  [facility], Mobile Crisis Care Unit  [phone]   [facility]  [address] [place], [place] [phone]   Doristine LocksSharon DeEsch [address] [place] [phone]    Self-Help/Support Groups Organization         Address  Phone             Notes  [facility] [place] - variety of support groups  [phone] Call for more information  Narcotics Anonymous (NA), [facility] [address], [place]  2 meetings at this location   Statisticianesidential Treatment Programs Organization         Address  Phone  Notes  [facility] [address],    [place]  [phone]   [facility]  [address], [place] [phone], [place], [place] [phone]   [facility] [address], [place] [phone] Admissions: 8am-3pm M-F  [facility] [address]    [place], [place] [phone]   [facility] [address], [place], [place] [phone]   [facility] [address]  [place], [place] [phone]     [facility] - Intensive Outpatient [address], [place], [place] [phone]   [facility] ([facility] [address]  [place], [place] [phone] or [phone]   [facility] ([facility]) [address] [place], [place] [phone] Accepts Medicaid  [facility] [address]  [place] [phone] Substance Abuse/Addiction Treatment   [facility] Behavioral Health Resources Organization         Address  Phone  Notes  [facility]  [phone]   Angie FavaJulie Brannon, PhD [address], [address] [place], [place]   [phone] or [phone]) [phone]   [facility] [place]   [address] [place] [address] [place], [place] [phone]) [phone]   [facility] [address], [place], [place] [phone]) [phone] Insurance/Medicaid/sponsorship through [facility]  [facility] [address]                                    [place], [place] [phone]) [phone] Therapy/tele-psych/case  [facility] [address].   [place], [place] [phone]) [phone]    Dr. Lolly MustacheArfeen  [phone](336) [phone]   [facility] [place]  [facility]) [address], [place] 2) [address], [place] 3)  [address], [place] [phone]  [phone]   [facility] [phone]) [phone] or [phone](336) [phone] (After Hours)

## 2015-05-04 NOTE — ED Provider Notes (Signed)
CSN: [id number]     Arrival date & time 04/26/15  [id number] History   First MD Initiated Contact with Patient 04/26/15 [phone]     Chief Complaint  Patient presents with  . Near Syncope    possible seizure     (Consider location/radiation/quality/duration/timing/severity/associated sxs/prior Treatment) HPI   39 year old male with possible seizure. Patient is alcoholic. Last drink was yesterday. Patient felt that he had some type of aura like he may have a seizure. The next thing he remembers is waking up on the ground with someone over him. Currently with mild headache. No other complaints. No acute visual complaints, number, tenderness or loss of strength. Denies any other ingestion.  Past Medical History  Diagnosis Date  . Alcohol abuse   . Seizure    History reviewed. No pertinent past surgical history. Family History  Problem Relation Age of Onset  . Diabetes Mother   . Hypertension Mother    History  Substance Use Topics  . Smoking status: Current Every Day Smoker -- 0.25 packs/day for 4 years    Types: Cigarettes  . Smokeless tobacco: Current User  . Alcohol Use: Yes     Comment: drinks @ 9 beers to keep w/d s/s down    Review of Systems  All systems reviewed and negative, other than as noted in HPI.   Allergies  Review of patient's allergies indicates no known allergies.  Home Medications   Prior to Admission medications   Medication Sig Start Date End Date Taking? Authorizing Provider  folic acid (FOLVITE) 1 MG tablet Take 1 tablet (1 mg total) by mouth daily. 03/25/15  Yes Shanker Levora DredgeM Ghimire, MD  LORazepam (ATIVAN) 1 MG tablet Take 1 mg by mouth daily as needed for anxiety (withdrawal/seizure).   Yes Historical Provider, MD  multivitamin (ONE-A-DAY MEN'S) TABS tablet Take 1 tablet by mouth daily. 03/25/15  Yes Shanker Levora DredgeM Ghimire, MD  Alum & Mag Hydroxide-Simeth (MAGIC MOUTHWASH W/LIDOCAINE) SOLN Take 10 mLs by mouth 4 (four) times daily as needed for mouth pain. Patient  not taking: Reported on 04/26/2015 03/25/15   Maretta BeesShanker M Ghimire, MD  FLUoxetine (PROZAC) 20 MG capsule Take 1 capsule (20 mg total) by mouth daily. Patient not taking: Reported on 04/26/2015 03/25/15   Maretta BeesShanker M Ghimire, MD  thiamine (VITAMIN B-1) 100 MG tablet Take 1 tablet (100 mg total) by mouth daily. Patient not taking: Reported on 04/26/2015 03/25/15   Maretta BeesShanker M Ghimire, MD  traZODone (DESYREL) 50 MG tablet Take 1 tablet (50 mg total) by mouth at bedtime. Patient not taking: Reported on 04/26/2015 03/25/15   Maretta BeesShanker M Ghimire, MD   BP 123/77 mmHg  Pulse 80  Temp(Src) 98.1 F (36.7 C) (Oral)  Resp 16  Ht 5\' 11"  (1.803 m)  Wt 174 lb (78.926 kg)  BMI 24.28 kg/m2  SpO2 96% Physical Exam  Constitutional: He is oriented to person, place, and time. He appears well-developed and well-nourished. No distress.  HENT:  Head: Normocephalic and atraumatic.  Eyes: Conjunctivae are normal. Right eye exhibits no discharge. Left eye exhibits no discharge.  Neck: Neck supple.  Cardiovascular: Normal rate, regular rhythm and normal heart sounds.  Exam reveals no gallop and no friction rub.   No murmur heard. Pulmonary/Chest: Effort normal and breath sounds normal. No respiratory distress.  Abdominal: Soft. He exhibits no distension. There is no tenderness.  Musculoskeletal: He exhibits no edema or tenderness.  Neurological: He is alert and oriented to person, place, and time. No cranial nerve deficit.  He exhibits normal muscle tone. Coordination normal.  Skin: Skin is warm and dry.  Psychiatric: He has a normal mood and affect. His behavior is normal. Thought content normal.  Speech clear. Content appropriate. Follows commands.  Nursing note and vitals reviewed.   ED Course  Procedures (including critical care time) Labs Review Labs Reviewed  CBC WITH DIFFERENTIAL/PLATELET - Abnormal; Notable for the following:    WBC 12.6 (*)    MCHC 36.2 (*)    Neutro Abs 8.5 (*)    Monocytes Absolute 1.3 (*)    All  other components within normal limits  BASIC METABOLIC PANEL    Imaging Review No results found.   EKG Interpretation   Date/Time:  Thursday April 26 2015 08:54:39 EDT Ventricular Rate:  97 PR Interval:  158 QRS Duration: 89 QT Interval:  359 QTC Calculation: 456 R Axis:   96 Text Interpretation:  Sinus rhythm Borderline right axis deviation ST  elev, probable normal early repol pattern similar to previous Confirmed by  Angelisa Winthrop  MD, Hildegard Hlavac ([id number]) on 04/26/2015 9:13:28 AM      MDM   Final diagnoses:  Alcohol dependence with withdrawal with complication    He is alert and oriented. Following commands. Nonfocal neuro exam. He does not appear to be acutely trying at this time. He doesn't even have appreciable tremor currently. Discharge with outpatient resources for alcohol abuse/rehabilitation.    Raeford Razor, MD 05/04/15 [phone]

## 2015-12-08 ENCOUNTER — Encounter (HOSPITAL_COMMUNITY): Payer: Self-pay | Admitting: Emergency Medicine

## 2015-12-08 ENCOUNTER — Emergency Department (HOSPITAL_COMMUNITY)
Admission: EM | Admit: 2015-12-08 | Discharge: 2015-12-09 | Disposition: A | Discharge status: Home / Self Care | Payer: No Typology Code available for payment source | Attending: Emergency Medicine | Admitting: Emergency Medicine

## 2015-12-08 DIAGNOSIS — R569 Unspecified convulsions: Secondary | ICD-10-CM

## 2015-12-08 DIAGNOSIS — Z79899 Other long term (current) drug therapy: Secondary | ICD-10-CM | POA: Insufficient documentation

## 2015-12-08 DIAGNOSIS — F17210 Nicotine dependence, cigarettes, uncomplicated: Secondary | ICD-10-CM | POA: Insufficient documentation

## 2015-12-08 MED ORDER — SODIUM CHLORIDE 0.9 % IV BOLUS (SEPSIS)
500.0000 mL | Freq: Once | INTRAVENOUS | Status: AC
  Administered 2015-12-09: 500 mL via INTRAVENOUS

## 2015-12-08 MED ORDER — SODIUM CHLORIDE 0.9 % IV SOLN
1000.0000 mg | Freq: Once | INTRAVENOUS | Status: AC
  Administered 2015-12-09: 1000 mg via INTRAVENOUS
  Filled 2015-12-08: qty 10

## 2015-12-08 NOTE — ED Notes (Signed)
Per EMS , pt. From home with complaint of seizure, witnessed by family lasted about 2 mins, no postictal reported, pt. Alert and oriented x3 upon assessment by EMS. Denied etoh.

## 2015-12-08 NOTE — ED Provider Notes (Signed)
CSN: [id number]     Arrival date & time 12/08/15  2346 History  By signing my name below, I, Phillis Haggis, attest that this documentation has been prepared under the direction and in the presence of Seira Cody, MD. Electronically Signed: Phillis Haggis, ED Scribe. 12/08/2015. 11:50 PM.  Chief Complaint  Patient presents with  . Seizures   Patient is a 40 y.o. male presenting with seizures. The history is provided by the patient. No language interpreter was used.  Seizures Seizure activity on arrival: no   Seizure type:  Grand mal Preceding symptoms: no sensation of an aura present   Initial focality:  None Episode characteristics: generalized shaking   Postictal symptoms: no confusion   Return to baseline: yes   Severity:  Mild Timing:  Once Progression:  Resolved Context: not intracranial lesion   Recent head injury:  No recent head injuries PTA treatment:  None History of seizures: yes    HPI Comments: Dwayne Bolton is a 40 y.o. male with a hx of 13 seizures and alcohol abuse brought in by EMS who presents to the Emergency Department complaining of seizure onset PTA. He states that his girlfriend witnessed his seizure, which lasted about 2-3 minutes. Pt bit both sides of his tongue during the seizure. Pt states that he quit drinking about a week ago when he began to feel "sick." He reports that the last time he had a seizure, about 1 year ago, followed an attempt to detox from alcohol. He denies alcohol use today.  Past Medical History  Diagnosis Date  . Alcohol abuse   . Seizure    No past surgical history on file. Family History  Problem Relation Age of Onset  . Diabetes Mother   . Hypertension Mother    Social History  Substance Use Topics  . Smoking status: Current Every Day Smoker -- 0.25 packs/day for 4 years    Types: Cigarettes  . Smokeless tobacco: Current User  . Alcohol Use: Yes     Comment: drinks @ 9 beers to keep w/d s/s down    Review of Systems   Neurological: Positive for seizures.  All other systems reviewed and are negative.     Allergies  Review of patient's allergies indicates no known allergies.  Home Medications   Prior to Admission medications   Medication Sig Start Date End Date Taking? Authorizing Provider  Alum & Mag Hydroxide-Simeth (MAGIC MOUTHWASH W/LIDOCAINE) SOLN Take 10 mLs by mouth 4 (four) times daily as needed for mouth pain. Patient not taking: Reported on 04/26/2015 03/25/15   Maretta Bees, MD  FLUoxetine (PROZAC) 20 MG capsule Take 1 capsule (20 mg total) by mouth daily. Patient not taking: Reported on 04/26/2015 03/25/15   Maretta Bees, MD  folic acid (FOLVITE) 1 MG tablet Take 1 tablet (1 mg total) by mouth daily. 03/25/15   Shanker Levora Dredge, MD  LORazepam (ATIVAN) 1 MG tablet Take 1 mg by mouth daily as needed for anxiety (withdrawal/seizure).    Historical Provider, MD  multivitamin (ONE-A-DAY MEN'S) TABS tablet Take 1 tablet by mouth daily. 03/25/15   Shanker Levora Dredge, MD  thiamine (VITAMIN B-1) 100 MG tablet Take 1 tablet (100 mg total) by mouth daily. Patient not taking: Reported on 04/26/2015 03/25/15   Maretta Bees, MD  traZODone (DESYREL) 50 MG tablet Take 1 tablet (50 mg total) by mouth at bedtime. Patient not taking: Reported on 04/26/2015 03/25/15   Maretta Bees, MD   BP 128/76  mmHg  Pulse 72  Temp(Src) 98 F (36.7 C) (Oral)  Resp 19  Ht  (1.803 m)  Wt 180 lb (81.647 kg)  BMI 25.12 kg/m2  SpO2 96% Physical Exam  Constitutional: He is oriented to person, place, and time. He appears well-developed and well-nourished. No distress.  HENT:  Head: Normocephalic and atraumatic.  Mouth/Throat: Oropharynx is clear and moist. No oropharyngeal exudate.  Trachea midline; bite marks to both sides of tongue  Eyes: Conjunctivae and EOM are normal. Pupils are equal, round, and reactive to light.  Neck: Trachea normal and normal range of motion. Neck supple. No JVD present. Carotid bruit  is not present.  Cardiovascular: Normal rate and regular rhythm.  Exam reveals no gallop and no friction rub.   No murmur heard. Pulmonary/Chest: Effort normal and breath sounds normal. No stridor. He has no wheezes. He has no rales.  Abdominal: Soft. Bowel sounds are normal. He exhibits no mass. There is no tenderness. There is no rebound and no guarding.  Musculoskeletal: Normal range of motion.  Lymphadenopathy:    He has no cervical adenopathy.  Neurological: He is alert and oriented to person, place, and time. He has normal reflexes. No cranial nerve deficit. He exhibits normal muscle tone. Coordination normal.  Cranial nerves 2-12 intact  Skin: Skin is warm and dry. He is not diaphoretic.  Psychiatric: He has a normal mood and affect. His behavior is normal.  Nursing note and vitals reviewed.   ED Course  Procedures (including critical care time) DIAGNOSTIC STUDIES: Oxygen Saturation is 96% on RA, normal by my interpretation.    COORDINATION OF CARE: 11:51 PM-Discussed treatment plan which includes Keppra with pt at bedside and pt agreed to plan.    Labs Review Labs Reviewed - No data to display  Imaging Review No results found. I have personally reviewed and evaluated these images and lab results as part of my medical decision-making.   EKG Interpretation None      MDM   Final diagnoses:  None    Results for orders placed or performed during the hospital encounter of 12/08/15  CBC with Differential/Platelet  Result Value Ref Range   WBC 7.8 4.0 - 10.5 K/uL   RBC 4.26 4.22 - 5.81 MIL/uL   Hemoglobin 14.5 13.0 - 17.0 g/dL   HCT 16.1 09.6 - 04.5 %   MCV 96.7 78.0 - 100.0 fL   MCH 34.0 26.0 - 34.0 pg   MCHC 35.2 30.0 - 36.0 g/dL   RDW 40.9 81.1 - 91.4 %   Platelets 268 150 - 400 K/uL   Neutrophils Relative % 58 %   Neutro Abs 4.5 1.7 - 7.7 K/uL   Lymphocytes Relative 20 %   Lymphs Abs 1.6 0.7 - 4.0 K/uL   Monocytes Relative 19 %   Monocytes Absolute 1.5  (H) 0.1 - 1.0 K/uL   Eosinophils Relative 2 %   Eosinophils Absolute 0.2 0.0 - 0.7 K/uL   Basophils Relative 1 %   Basophils Absolute 0.0 0.0 - 0.1 K/uL  I-Stat Chem 8, ED  Result Value Ref Range   Sodium 134 (L) 135 - 145 mmol/L   Potassium 6.0 (H) 3.5 - 5.1 mmol/L   Chloride 96 (L) 101 - 111 mmol/L   BUN 13 6 - 20 mg/dL   Creatinine, Ser 7.82 0.61 - 1.24 mg/dL   Glucose, Bld 956 (H) 65 - 99 mg/dL   Calcium, Ion 2.13 (L) 1.12 - 1.23 mmol/L   TCO2 23  0 - 100 mmol/L   Hemoglobin 16.3 13.0 - 17.0 g/dL   HCT 25.3 66.4 - 40.3 %   No results found.  Medications  levETIRAcetam (KEPPRA) 1,000 mg in sodium chloride 0.9 % 100 mL IVPB (1,000 mg Intravenous New Bag/Given 12/09/15 0014)  sodium chloride 0.9 % bolus 500 mL (500 mLs Intravenous New Bag/Given 12/09/15 0002)    Will start keppra and give resource guide for outpatient addiction treatment   I personally performed the services described in this documentation, which was scribed in my presence. The recorded information has been reviewed and is accurate.      Cy Blamer, MD 12/09/15 0140

## 2015-12-08 NOTE — ED Notes (Signed)
Bed: ZH08 Expected date:  Expected time:  Means of arrival:  Comments: EMS 40yo M seizure

## 2015-12-09 ENCOUNTER — Encounter (HOSPITAL_COMMUNITY): Payer: Self-pay | Admitting: Emergency Medicine

## 2015-12-09 LAB — I-STAT CHEM 8, ED
BUN: 13 mg/dL (ref 6–20)
CREATININE: 1.1 mg/dL (ref 0.61–1.24)
Calcium, Ion: 1.01 mmol/L — ABNORMAL LOW (ref 1.12–1.23)
Chloride: 96 mmol/L — ABNORMAL LOW (ref 101–111)
GLUCOSE: 113 mg/dL — AB (ref 65–99)
HEMATOCRIT: 48 % (ref 39.0–52.0)
HEMOGLOBIN: 16.3 g/dL (ref 13.0–17.0)
Potassium: 6 mmol/L — ABNORMAL HIGH (ref 3.5–5.1)
Sodium: 134 mmol/L — ABNORMAL LOW (ref 135–145)
TCO2: 23 mmol/L (ref 0–100)

## 2015-12-09 LAB — CBC WITH DIFFERENTIAL/PLATELET
BASOS ABS: 0 10*3/uL (ref 0.0–0.1)
BASOS PCT: 1 %
EOS PCT: 2 %
Eosinophils Absolute: 0.2 10*3/uL (ref 0.0–0.7)
HEMATOCRIT: 41.2 % (ref 39.0–52.0)
Hemoglobin: 14.5 g/dL (ref 13.0–17.0)
Lymphocytes Relative: 20 %
Lymphs Abs: 1.6 10*3/uL (ref 0.7–4.0)
MCH: 34 pg (ref 26.0–34.0)
MCHC: 35.2 g/dL (ref 30.0–36.0)
MCV: 96.7 fL (ref 78.0–100.0)
MONO ABS: 1.5 10*3/uL — AB (ref 0.1–1.0)
MONOS PCT: 19 %
NEUTROS ABS: 4.5 10*3/uL (ref 1.7–7.7)
Neutrophils Relative %: 58 %
PLATELETS: 268 10*3/uL (ref 150–400)
RBC: 4.26 MIL/uL (ref 4.22–5.81)
RDW: 13.2 % (ref 11.5–15.5)
WBC: 7.8 10*3/uL (ref 4.0–10.5)

## 2015-12-09 LAB — RAPID URINE DRUG SCREEN, HOSP PERFORMED
Amphetamines: NOT DETECTED
Barbiturates: NOT DETECTED
Benzodiazepines: NOT DETECTED
Cocaine: NOT DETECTED
OPIATES: NOT DETECTED
Tetrahydrocannabinol: NOT DETECTED

## 2015-12-09 LAB — POTASSIUM: POTASSIUM: 3.9 mmol/L (ref 3.5–5.1)

## 2015-12-09 LAB — ETHANOL

## 2015-12-09 MED ORDER — LEVETIRACETAM 500 MG PO TABS: 500.0000 mg | ORAL_TABLET | Freq: Two times a day (BID) | ORAL | Status: DC

## 2015-12-09 NOTE — Discharge Instructions (Signed)
Seizure, Adult No driving until cleared by neurology A seizure means there is unusual activity in the brain. A seizure can cause changes in attention or behavior. Seizures often cause shaking (convulsions). Seizures often last from 30 seconds to 2 minutes. HOME CARE   If you are given medicines, take them exactly as told by your doctor.  Keep all doctor visits as told.  Do not swim or drive until your doctor says it is okay.  Teach others what to do if you have a seizure. They should:  Lay you on the ground.  Put a cushion under your head.  Loosen any tight clothing around your neck.  Turn you on your side.  Stay with you until you get better. GET HELP RIGHT AWAY IF:   The seizure lasts longer than 2 to 5 minutes.  The seizure is very bad.  The person does not wake up after the seizure.  The person's attention or behavior changes. Drive the person to the emergency room or call your local emergency services (911 in [place] MAKE SURE YOU:   Understand these instructions.  Will watch your condition.  Will get help right away if you are not doing well or get worse.   This information is not intended to replace advice given to you by your health care provider. Make sure you discuss any questions you have with your health care provider.   Document Released: 03/24/2008 Document Revised: 12/29/2011 Document Reviewed: 05/18/2013 Elsevier Interactive Patient Education [employer]

## 2015-12-09 NOTE — ED Notes (Signed)
Notified EDP,Palumbo,MD., pt. i-stat Chem 8 results potassium 6.0.

## 2015-12-28 ENCOUNTER — Encounter: Payer: Self-pay | Admitting: Family Medicine

## 2015-12-28 ENCOUNTER — Ambulatory Visit (INDEPENDENT_AMBULATORY_CARE_PROVIDER_SITE_OTHER): Payer: Self-pay | Admitting: Family Medicine

## 2015-12-28 VITALS — BP 123/78 | HR 75 | Temp 98.1°F | Resp 16 | Ht 70.0 in | Wt 178.0 lb

## 2015-12-28 DIAGNOSIS — F411 Generalized anxiety disorder: Secondary | ICD-10-CM

## 2015-12-28 DIAGNOSIS — G8929 Other chronic pain: Secondary | ICD-10-CM

## 2015-12-28 DIAGNOSIS — G40909 Epilepsy, unspecified, not intractable, without status epilepticus: Secondary | ICD-10-CM

## 2015-12-28 DIAGNOSIS — F17200 Nicotine dependence, unspecified, uncomplicated: Secondary | ICD-10-CM

## 2015-12-28 DIAGNOSIS — M549 Dorsalgia, unspecified: Secondary | ICD-10-CM

## 2015-12-28 LAB — CBC WITH DIFFERENTIAL/PLATELET
BASOS ABS: 0.2 10*3/uL — AB (ref 0.0–0.1)
BASOS PCT: 1 % (ref 0–1)
EOS ABS: 0.2 10*3/uL (ref 0.0–0.7)
EOS PCT: 1 % (ref 0–5)
HCT: 45.5 % (ref 39.0–52.0)
Hemoglobin: 15.6 g/dL (ref 13.0–17.0)
LYMPHS ABS: 2.9 10*3/uL (ref 0.7–4.0)
Lymphocytes Relative: 19 % (ref 12–46)
MCH: 33.1 pg (ref 26.0–34.0)
MCHC: 34.3 g/dL (ref 30.0–36.0)
MCV: 96.4 fL (ref 78.0–100.0)
MONOS PCT: 6 % (ref 3–12)
MPV: 9.8 fL (ref 8.6–12.4)
Monocytes Absolute: 0.9 10*3/uL (ref 0.1–1.0)
Neutro Abs: 11 10*3/uL — ABNORMAL HIGH (ref 1.7–7.7)
Neutrophils Relative %: 73 % (ref 43–77)
PLATELETS: 390 10*3/uL (ref 150–400)
RBC: 4.72 MIL/uL (ref 4.22–5.81)
RDW: 13.4 % (ref 11.5–15.5)
WBC: 15 10*3/uL — ABNORMAL HIGH (ref 4.0–10.5)

## 2015-12-28 LAB — COMPLETE METABOLIC PANEL WITH GFR
ALBUMIN: 4.5 g/dL (ref 3.6–5.1)
ALK PHOS: 58 U/L (ref 40–115)
ALT: 25 U/L (ref 9–46)
AST: 24 U/L (ref 10–40)
BILIRUBIN TOTAL: 0.4 mg/dL (ref 0.2–1.2)
BUN: 12 mg/dL (ref 7–25)
CO2: 25 mmol/L (ref 20–31)
CREATININE: 0.93 mg/dL (ref 0.60–1.35)
Calcium: 9.9 mg/dL (ref 8.6–10.3)
Chloride: 100 mmol/L (ref 98–110)
Glucose, Bld: 63 mg/dL — ABNORMAL LOW (ref 65–99)
Potassium: 4.5 mmol/L (ref 3.5–5.3)
Sodium: 139 mmol/L (ref 135–146)
TOTAL PROTEIN: 7.3 g/dL (ref 6.1–8.1)

## 2015-12-28 LAB — POCT URINALYSIS DIP (DEVICE)
Bilirubin Urine: NEGATIVE
GLUCOSE, UA: NEGATIVE mg/dL
Hgb urine dipstick: NEGATIVE
Ketones, ur: NEGATIVE mg/dL
LEUKOCYTES UA: NEGATIVE
Nitrite: NEGATIVE
PROTEIN: NEGATIVE mg/dL
Specific Gravity, Urine: 1.025 (ref 1.005–1.030)
UROBILINOGEN UA: 0.2 mg/dL (ref 0.0–1.0)
pH: 6 (ref 5.0–8.0)

## 2015-12-28 MED ORDER — IBUPROFEN 600 MG PO TABS: 600.0000 mg | ORAL_TABLET | Freq: Three times a day (TID) | ORAL | Status: DC | PRN

## 2015-12-28 MED ORDER — LEVETIRACETAM 500 MG PO TABS: 500.0000 mg | ORAL_TABLET | Freq: Two times a day (BID) | ORAL | Status: DC

## 2015-12-28 MED ORDER — KETOROLAC TROMETHAMINE 60 MG/2ML IM SOLN
30.0000 mg | Freq: Once | INTRAMUSCULAR | Status: AC
  Administered 2015-12-28: 30 mg via INTRAMUSCULAR

## 2015-12-28 MED FILL — ?LEVETIRACETAM 500 MG TABLE: 500 | 30 days supply | Qty: 60 | Fill #0

## 2015-12-28 MED FILL — IBUPROFEN 600 MG TABLET: 600 | 10 days supply | Qty: 30 | Fill #0
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
## 2015-12-29 LAB — SEDIMENTATION RATE: SED RATE: 5 mm/h (ref 0–15)

## 2015-12-29 LAB — VITAMIN D 25 HYDROXY (VIT D DEFICIENCY, FRACTURES): Vit D, 25-Hydroxy: 34 ng/mL (ref 30–100)

## 2015-12-31 ENCOUNTER — Telehealth: Payer: Self-pay | Admitting: *Deleted

## 2015-12-31 DIAGNOSIS — G8929 Other chronic pain: Secondary | ICD-10-CM

## 2015-12-31 DIAGNOSIS — G40909 Epilepsy, unspecified, not intractable, without status epilepticus: Secondary | ICD-10-CM | POA: Insufficient documentation

## 2015-12-31 DIAGNOSIS — M5441 Lumbago with sciatica, right side: Principal | ICD-10-CM

## 2015-12-31 DIAGNOSIS — F17200 Nicotine dependence, unspecified, uncomplicated: Secondary | ICD-10-CM | POA: Insufficient documentation

## 2015-12-31 DIAGNOSIS — M5442 Lumbago with sciatica, left side: Principal | ICD-10-CM

## 2015-12-31 NOTE — Telephone Encounter (Signed)
Dwayne Bolton's Girlfriends called to informed the provider that the patient is allergic to Ibuprofen and tylenol and will need another form of pain medicine to take. She is requesting a call back [phone]. Please advise provider. Thanks

## 2015-12-31 NOTE — Telephone Encounter (Signed)
[place], Please advise. Thanks!  

## 2016-01-01 MED ORDER — GABAPENTIN 100 MG PO CAPS: 100.0000 mg | ORAL_CAPSULE | Freq: Three times a day (TID) | ORAL | Status: DC

## 2016-01-01 MED FILL — GABAPENTIN 100 MG CAPSULE: 100 | 30 days supply | Qty: 90 | Fill #0

## 2016-01-01 NOTE — Telephone Encounter (Signed)
Patient called to report that he is "allergic" to both Tylenol and Ibuprofen, which was not disclosed during appointment to establish care. He states that he is continuing to have back pain with sciatica. Will start a trial of Gabapentin 100 mg TID. Will follow up in 1 month.   Meds ordered this encounter  Medications  . gabapentin (NEURONTIN) 100 MG capsule    Sig: Take 1 capsule (100 mg total) by mouth 3 (three) times daily.    Dispense:  90 capsule    Refill:  0     Goro Wenrick M, FNP

## 2016-01-04 ENCOUNTER — Ambulatory Visit: Payer: Self-pay | Admitting: Neurology

## 2016-01-12 ENCOUNTER — Ambulatory Visit (HOSPITAL_COMMUNITY)
Admission: RE | Admit: 2016-01-12 | Discharge: 2016-01-12 | Disposition: A | Discharge status: Home / Self Care | Payer: Self-pay | Attending: Psychiatry | Admitting: Psychiatry

## 2016-01-12 ENCOUNTER — Emergency Department (HOSPITAL_COMMUNITY)
Admission: EM | Admit: 2016-01-12 | Discharge: 2016-01-12 | Disposition: A | Discharge status: Home / Self Care | Payer: No Typology Code available for payment source | Attending: Emergency Medicine | Admitting: Emergency Medicine

## 2016-01-12 ENCOUNTER — Encounter (HOSPITAL_COMMUNITY): Payer: Self-pay

## 2016-01-12 DIAGNOSIS — R251 Tremor, unspecified: Secondary | ICD-10-CM | POA: Insufficient documentation

## 2016-01-12 DIAGNOSIS — F17210 Nicotine dependence, cigarettes, uncomplicated: Secondary | ICD-10-CM | POA: Insufficient documentation

## 2016-01-12 DIAGNOSIS — F419 Anxiety disorder, unspecified: Secondary | ICD-10-CM | POA: Insufficient documentation

## 2016-01-12 DIAGNOSIS — F1010 Alcohol abuse, uncomplicated: Secondary | ICD-10-CM | POA: Insufficient documentation

## 2016-01-12 DIAGNOSIS — Z833 Family history of diabetes mellitus: Secondary | ICD-10-CM | POA: Insufficient documentation

## 2016-01-12 DIAGNOSIS — R569 Unspecified convulsions: Secondary | ICD-10-CM | POA: Insufficient documentation

## 2016-01-12 DIAGNOSIS — Z8249 Family history of ischemic heart disease and other diseases of the circulatory system: Secondary | ICD-10-CM | POA: Insufficient documentation

## 2016-01-12 DIAGNOSIS — Z79899 Other long term (current) drug therapy: Secondary | ICD-10-CM | POA: Insufficient documentation

## 2016-01-12 DIAGNOSIS — Z720 Tobacco use: Secondary | ICD-10-CM | POA: Insufficient documentation

## 2016-01-12 MED ORDER — CHLORDIAZEPOXIDE HCL 25 MG PO CAPS: ORAL_CAPSULE | ORAL | Status: DC

## 2016-01-12 MED ORDER — LORAZEPAM 1 MG PO TABS
1.0000 mg | ORAL_TABLET | Freq: Once | ORAL | Status: AC
  Administered 2016-01-12: 1 mg via ORAL
  Filled 2016-01-12: qty 1

## 2016-01-12 NOTE — ED Provider Notes (Signed)
CSN: [id number]     Arrival date & time 01/12/16  1244 History   First MD Initiated Contact with Patient 01/12/16 1316     Chief Complaint  Patient presents with  . Alcohol Problem     (Consider location/radiation/quality/duration/timing/severity/associated sxs/prior Treatment) Patient is a 40 y.o. male presenting with alcohol problem. The history is provided by the patient.  Alcohol Problem Pertinent negatives include no chest pain, no abdominal pain, no headaches and no shortness of breath.  Patient indicates has history of etoh abuse, and is interested in rehab/detox programs.  Pt indicates last drank today, 2 beers. States he does not drink every day, and usually will have 1-2 beers per day. Denies other substance abuse. States recent health at baseline. Denies recent trauma or fall. Reports hx seizure, initially after head injury a couple years ago. Denies seizure today or in past few days. pcp recently started on keppra and arranged neurology referral.  Pt indicates normal appetite. Sleeps fine. No wt loss. No recent febrile illness. Denies depression.      Past Medical History  Diagnosis Date  . Alcohol abuse   . Seizure [facility](HCC)    History reviewed. No pertinent past surgical history. Family History  Problem Relation Age of Onset  . Diabetes Mother   . Hypertension Mother    Social History  Substance Use Topics  . Smoking status: Current Every Day Smoker -- 0.50 packs/day for 4 years    Types: Cigarettes  . Smokeless tobacco: Current User  . Alcohol Use: No     Comment: drinks @ 9 beers to keep w/d s/s down    Review of Systems  Constitutional: Negative for fever.  HENT: Negative for sore throat.   Eyes: Negative for pain.  Respiratory: Negative for shortness of breath.   Cardiovascular: Negative for chest pain.  Gastrointestinal: Negative for vomiting, abdominal pain and diarrhea.  Genitourinary: Negative for flank pain.  Musculoskeletal: Negative for back pain and  neck pain.  Skin: Negative for rash.  Neurological: Negative for headaches.  Hematological: Does not bruise/bleed easily.  Psychiatric/Behavioral: Negative for dysphoric mood.      Allergies  Ibuprofen  Home Medications   Prior to Admission medications   Medication Sig Start Date End Date Taking? Authorizing Provider  gabapentin (NEURONTIN) 100 MG capsule Take 1 capsule (100 mg total) by mouth 3 (three) times daily. 01/01/16   Massie MaroonLachina M Hollis, FNP  levETIRAcetam (KEPPRA) 500 MG tablet Take 1 tablet (500 mg total) by mouth 2 (two) times daily. 12/28/15   Massie MaroonLachina M Hollis, FNP  multivitamin (ONE-A-DAY MEN'S) TABS tablet Take 1 tablet by mouth daily. 03/25/15   Shanker Levora DredgeM Ghimire, MD  thiamine (VITAMIN B-1) 100 MG tablet Take 1 tablet (100 mg total) by mouth daily. 03/25/15   Shanker Levora DredgeM Ghimire, MD   BP 127/97 mmHg  Pulse 111  Temp(Src) 98.7 F (37.1 C) (Oral)  Resp 16  SpO2 95% Physical Exam  Constitutional: He is oriented to person, place, and time. He appears well-developed and well-nourished. No distress.  HENT:  Head: Atraumatic.  Mouth/Throat: Oropharynx is clear and moist.  Eyes: Conjunctivae are normal. Pupils are equal, round, and reactive to light.  Neck: Neck supple. No tracheal deviation present.  Cardiovascular: Normal rate and intact distal pulses.   Pulmonary/Chest: Effort normal and breath sounds normal. No accessory muscle usage. No respiratory distress.  Abdominal: He exhibits no distension. There is no tenderness.  Musculoskeletal: He exhibits no edema.  Neurological: He is alert and  oriented to person, place, and time.  Steady gait. No tremor or shakes.   Skin: Skin is warm and dry. He is not diaphoretic.  Psychiatric:  Appears mildly anxious.  Denies depression or any thoughts of self harm/SI.   Nursing note and vitals reviewed.   ED Course  Procedures (including critical care time)   MDM   Ativan 1 mg po.  No current tremor or shakes.  Last year pt  with workup in hospital, including mri, eeg - neg.  Pts pcp started on keppra earlier this month for seizure prevention, and they are in process of doing neurology referral.  No recent seizure.   Pt denies depression or thoughts of self harm.  Will provide patient resource guide, and rx librium.   Pt currently appears stable for d/c.      Cathren Laine, MD 01/12/16 1328

## 2016-01-12 NOTE — ED Notes (Signed)
MD at bedside. 

## 2016-01-12 NOTE — ED Notes (Signed)
[facility].  Pt drinks almost daily.  Pt has had 2 beers today.  Pt states when he tries to stop, he has seizures.  No suicidal thoughts or Homicidal thoughts.  Denies all other drugs.

## 2016-01-12 NOTE — Discharge Instructions (Signed)
It was our pleasure to provide your ER care today - we hope that you feel better.  Avoid alcohol use - follow up with AA, and use resource guide provided for additional community resources.  Take librium as need, as prescribed, for withdrawal symptoms - no driving when taking librium, or any time when drinking alcohol.   Given seizures, no driving until cleared to so by your doctor.   Also follow up with primary care doctor in the coming week.  Also follow up with neurology as arranged by your doctor.   Return to ER if worse, new symptoms, fevers, trouble breathing, seizures, medical emergency, other concern.       Alcohol Use Disorder Alcohol use disorder is a mental disorder. It is not a one-time incident of heavy drinking. Alcohol use disorder is the excessive and uncontrollable use of alcohol over time that leads to problems with functioning in one or more areas of daily living. People with this disorder risk harming themselves and others when they drink to excess. Alcohol use disorder also can cause other mental disorders, such as mood and anxiety disorders, and serious physical problems. People with alcohol use disorder often misuse other drugs.  Alcohol use disorder is common and widespread. Some people with this disorder drink alcohol to cope with or escape from negative life events. Others drink to relieve chronic pain or symptoms of mental illness. People with a family history of alcohol use disorder are at higher risk of losing control and using alcohol to excess.  Drinking too much alcohol can cause injury, accidents, and health problems. One drink can be too much when you are:  Working.  Pregnant or breastfeeding.  Taking medicines. Ask your doctor.  Driving or planning to drive. SYMPTOMS  Signs and symptoms of alcohol use disorder may include the following:   Consumption ofalcohol inlarger amounts or over a longer period of time than intended.  Multiple unsuccessful  attempts to cutdown or control alcohol use.   A great deal of time spent obtaining alcohol, using alcohol, or recovering from the effects of alcohol (hangover).  A strong desire or urge to use alcohol (cravings).   Continued use of alcohol despite problems at work, school, or home because of alcohol use.   Continued use of alcohol despite problems in relationships because of alcohol use.  Continued use of alcohol in situations when it is physically hazardous, such as driving a car.  Continued use of alcohol despite awareness of a physical or psychological problem that is likely related to alcohol use. Physical problems related to alcohol use can involve the brain, heart, liver, stomach, and intestines. Psychological problems related to alcohol use include intoxication, depression, anxiety, psychosis, delirium, and dementia.   The need for increased amounts of alcohol to achieve the same desired effect, or a decreased effect from the consumption of the same amount of alcohol (tolerance).  Withdrawal symptoms upon reducing or stopping alcohol use, or alcohol use to reduce or avoid withdrawal symptoms. Withdrawal symptoms include:  Racing heart.  Hand tremor.  Difficulty sleeping.  Nausea.  Vomiting.  Hallucinations.  Restlessness.  Seizures. DIAGNOSIS Alcohol use disorder is diagnosed through an assessment by your health care provider. Your health care provider may start by asking three or four questions to screen for excessive or problematic alcohol use. To confirm a diagnosis of alcohol use disorder, at least two symptoms must be present within a 49-month period. The severity of alcohol use disorder depends on the number of symptoms:  Mild--two or three.  Moderate--four or five.  Severe--six or more. Your health care provider may perform a physical exam or use results from lab tests to see if you have physical problems resulting from alcohol use. Your health care  provider may refer you to a mental health professional for evaluation. TREATMENT  Some people with alcohol use disorder are able to reduce their alcohol use to low-risk levels. Some people with alcohol use disorder need to quit drinking alcohol. When necessary, mental health professionals with specialized training in substance use treatment can help. Your health care provider can help you decide how severe your alcohol use disorder is and what type of treatment you need. The following forms of treatment are available:   Detoxification. Detoxification involves the use of prescription medicines to prevent alcohol withdrawal symptoms in the first week after quitting. This is important for people with a history of symptoms of withdrawal and for heavy drinkers who are likely to have withdrawal symptoms. Alcohol withdrawal can be dangerous and, in severe cases, cause death. Detoxification is usually provided in a hospital or in-patient substance use treatment facility.  Counseling or talk therapy. Talk therapy is provided by substance use treatment counselors. It addresses the reasons people use alcohol and ways to keep them from drinking again. The goals of talk therapy are to help people with alcohol use disorder find healthy activities and ways to cope with life stress, to identify and avoid triggers for alcohol use, and to handle cravings, which can cause relapse.  Medicines.Different medicines can help treat alcohol use disorder through the following actions:  Decrease alcohol cravings.  Decrease the positive reward response felt from alcohol use.  Produce an uncomfortable physical reaction when alcohol is used (aversion therapy).  Support groups. Support groups are run by people who have quit drinking. They provide emotional support, advice, and guidance. These forms of treatment are often combined. Some people with alcohol use disorder benefit from intensive combination treatment provided by  specialized substance use treatment centers. Both inpatient and outpatient treatment programs are available.   This information is not intended to replace advice given to you by your health care provider. Make sure you discuss any questions you have with your health care provider.   Document Released: 11/13/2004 Document Revised: 10/27/2014 Document Reviewed: 01/13/2013 Elsevier Interactive Patient Education 2016 [employer].   [employer] Guide Outpatient Counseling/Substance Abuse Adult The United Ways 211 is a great source of information about community services available.  Access by dialing 2-1-1 from anywhere in [place], or by website -  PooledIncome.pl.   Other Local Resources (Updated 10/2015)  Crisis Hotlines   Services     Area Served  [employer]  Crisis Hotline, available 24 hours a day, 7 days a week: (306)726-0899 [facility], [place]   [facility]  Crisis Hotline, available 24 hours a day, 7 days a week: 605-295-1273 [facility], [place]  [facility]  Suicide Prevention Hotline, available 24 hours a day, 7 days a week: (213)331-1552 [facility], [place]  [employer], available 24 hours a day, 7 days a week: 610-213-9375 [facility], [place]   [facility] Access to [employer], available 24 hours a day, 7 days a week: (470)599-2644 All   [facility]  Crisis Hotline, available 24 hours a day, 7 days a week: 760 328 7340 All   Other Local Resources (Updated 10/2015)  Outpatient Counseling/ Substance Abuse Programs  Services     Address  and Phone Number  ADS (Alcohol and Drug Services)   Options include Individual counseling, group counseling, intensive outpatient program (several hours a day, several days a week)  Offers depression assessments  Provides methadone maintenance program [phone] [address], [address] [place], [place] [address]     [facility]   Offers partial hospitalization/day treatment and DUI/DWI programs  [employer], private insurance [phone] [address], [address] [place], [place] [address]  [facility]    Services include intensive outpatient program (several hours a day, several days a week), outpatient treatment, DUI/DWI services, family education  Also has some services specifically for [employer] transitional housing  [phone] [address] [place], [place] [address]     [facility]  [employer], private pay, and private insurance [phone] [address], [address] [place], [place] [address]  [employer] of Care  Services include individual counseling, substance abuse intensive outpatient program (several hours a day, several days a week), day treatment  Delene Loll, Medicaid, private insurance [phone] [address], [address] [place], [place] [address]  [facility]   Offers substance abuse intensive outpatient program (several hours a day, several days a week), partial hospitalization program [phone] [address] [place], [place] [address]  [phone] [address] [place], [place] [address]  [phone] [address] [place], [place] [address]  [phone], [address] [place], [place] [address]  [facility]  Individual counseling only  Accepts private insurance only [phone] [address] [place], [place] [address]  [facility]  Methadone maintenance program [phone] [address] [place], [place] [address]  [facility]  Walk-In Clinic providing substance abuse and mental health counseling  Accepts Medicaid, Medicare, private insurance  Offers sliding scale for uninsured [phone] [address] [place], [place]   [facility] [place], [employer].  Offers individual  counseling, and intensive in-home services [phone] [address] [place], [place] [address]  [facility] individual counseling, family counseling, group therapy, domestic violence counseling, consumer credit counseling  Accepts Medicare, Medicaid, private insurance  Offers sliding scale for uninsured [phone] [address] [place], [place] [address]  [phone] [facility], [address] [place], [place] [address]  [facility]  Offers individual, family and group counseling  3 locations - [place], [place], and [place]  [phone]  [address] [place], [place] [address]  [address] [place], [place] [address]  [address] [place], [place] [address]  [facility] Margo [facility]    Offers psychiatric assessment, 8-week Intensive Outpatient Program (several hours a day, several times a week, daytime or evenings), early recovery group, family Program, medication management  Private pay or private insurance only [phone], or  [phone] [address] [place], [place] [address]  [place] [employer] individual, couples and family counseling  Accepts Medicaid, private insurance, and sliding scale for uninsured [phone] [address] [place], [place] [address]  Len Blalock, MD  Individual counseling  Private insurance [phone] [address] [place], [place] [address]  [facility]   Offers assessment, substance abuse treatment, and behavioral health treatment [phone] [place], [place] [address]  [facility]  Individual counseling  Accepts private insurance [phone] [address] [place], [place] [address]  [facility]  Individual counseling  Delene Loll, private insurance [phone] [address] [place],  [place] [address]  [facility]    Offers intensive  outpatient program (several hours a day, several times a week)  Private pay, private insurance [phone] [facility], [place]  [facility]  Individual counseling  Medicare, private insurance [phone] [address] [place], [place] [address]  [facility]    Offers intensive outpatient program (several hours a day, several times a week) and partial hospitalization program [phone] [address] [place], [place] [address]  Emerson Monte, MD  Individual counseling [phone] [address] [place], [place] [address]  [facility]  Offers Christian counseling to individuals, couples, and families  Accepts Medicare and private insurance; offers sliding scale for uninsured [phone] [address] [place], [place] [address]  [facility]  [place] counseling [phone] [address], [address] [place], [place] [address]  [facility] crisis counseling, individual counseling, group therapy, in-home therapy, domestic violence services, day treatment, DWI services, Administrator, arts (CST), Doctor, hospital (ACTT), substance abuse Intensive Outpatient Program (several hours a day, several times a week)  2 locations - [place] and [place] [phone] [address] [place], [place] [address]  [phone] [address] [place], [place] [address]  [facility]     Individual counseling and group therapy  [employer], [place], [place] [phone] [address] [place], [place]  [facility]  Offers individual and family counseling  Offers LGBTQ services  Accepts private insurance and private pay [phone] [address] [place], [place] [address]  [facility]    Offers individual counseling, group therapy, and outpatient  detox  Accepts private insurance [phone] [address] [place], [place]  [facility]  Individual counseling  Accepts Medicare, private insurance [phone] [address] [place], [place] [address]  [employer]  Individual counseling  Accepts Medicare, private insurance [phone] [address] [place], [place] [address]  [facility]   Offers substance abuse Intensive Outpatient Program (several hours a day, several times a week) [phone], or [phone] [place], [place]    [employer] Guide Inpatient Behavioral Health/Residential  Substance Abuse Treatment Adults The United Ways 211 is a great source of information about community services available.  Access by dialing 2-1-1 from anywhere in [place], or by website -  PooledIncome.pl.   (Updated 10/2015)  Crisis Assistance 24 hours a day   Services Offered    Area [employer]  24-hour crisis assistance: [phone] [place], [place]   [facility]  24-hour crisis [phone] [place], [place]  [place]   24-hour crisis assistance: [phone] [place], [place]   [facility] Access to Care Line  24-hour crisis assistance; (801)469-9732 All   [facility]  24-hour crisis response line: 463-219-7625 All   Other Local Resources (Updated 10/2015)  Inpatient Behavioral Health/Residential Substance Abuse Treatment Programs   Services      Address and Phone Number  [facility] ([facility])   14-day residential rehabilitation  [phone] [address] [place], [place]  [facility] ([facility])    Detox - private pay only  14-day residential rehabilitation -  Medicaid, insurance, private pay only [phone], or [phone] [address], [place], [place] [address]   [facility]  [employer] only  Multiple facilities [phone] admissions   [facility] ([facility])   90-day program  Must be homeless to participate  [phone], or [phone] [place], [facility]  [facility]     Private Insurance only [phone], or  [phone] [address] [place], [place] [address]  [facility]     Must make an appointment  Transportation is offered from [employer] [address]  Accepts private pay, Sheryn BisonMedicare, [facility] Medicaid [phone]  [address] [place], [place] [address]   [employer]  Females only  Associated with [facility] [phone] [address] [place], [place] [id number]  [facility]   Private insurance only [phone], or [phone] [address] [place], [id number]  [facility]    Detox  Residential rehabilitation  Private insurance only  Multiple locations [phone] admissions  [facility] [place]    Private pay  Private insurance [phone] [address] [place], [place] [phone]  [facility]    Males only  Fee required at time of admission [phone] [address] [place], [place] [address]  [facility]    Private pay only  [phone] [place], [place]  RTS (Residential Treatment Services)    Detox - private pay, Medicaid  Residential rehabilitation for males  - Medicare, Medicaid, insurance, private pay [phone] [address] [place], [place]   [facility]    Walk-in interviews Monday - Saturday from 8 am - 4 pm  Individuals with legal charges are not eligible [phone] [address] [place], [place] [address]  [facility] [employer]   Must be willing to work  Must attend Alcoholics Anonymous meetings [phone] [address] [place], [place]   [facility]    Faith-based program  Private pay only  [phone] [address] [place], [place]

## 2016-01-12 NOTE — BH Assessment (Signed)
Assessment Note  Dwayne IsaacsROBERT Bolton is an 40 y.o. male. [facility] because of requesting alcohol detox.  Patient currently denies SI/HI, A/VH, and other self-injurious behaviors.  Patient reports daily alcohol use of 3 ore more 40 ounce beers and last drank this mornings.  Patient reports history of severe withdrawal symptoms such as tremors, seizures, and sensitivity to light.  Patient reports his last seizure episode was 2 weeks ago.  Patient reports a recent DUI arrest and upcoming court date on 02/08/2016.    This Clinical research associatewriter consulted with Vernona RiegerLaura, NP it is [facility].    Diagnosis: Alcohol use, severe  Past Medical History:  Past Medical History  Diagnosis Date  . Alcohol abuse   . Seizure (HCC)     No past surgical history on file.  Family History:  Family History  Problem Relation Age of Onset  . Diabetes Mother   . Hypertension Mother     Social History:  reports that he has been smoking Cigarettes.  He has a 2 pack-year smoking history. He uses smokeless tobacco. He reports that he does not drink alcohol or use illicit drugs.  Additional Social History:  Alcohol / Drug Use Pain Medications: see chart Prescriptions: see chart Over the Counter: see chart History of alcohol / drug use?: Yes Longest period of sobriety (when/how long): couple days Negative Consequences of Use: Financial, Armed forces operational officerLegal, Personal relationships, Work / School Withdrawal Symptoms: Tremors, Irritability, Seizures Onset of Seizures: withdrawals Date of most recent seizure: 2 weeks ago Substance #1 Name of Substance 1: alcohol 1 - Age of First Use: 35 1 - Amount (size/oz): 3 or more 40 ounce beers daily 1 - Frequency: daily 1 - Duration: ongoing 1 - Last Use / Amount: 01/12/2016- (3) 40 ounces today  CIWA: CIWA-Ar Nausea and Vomiting: no nausea and no vomiting Tactile Disturbances: very mild itching, pins and needles, burning or numbness Tremor: not  visible, but can be felt fingertip to fingertip Auditory Disturbances: very mild harshness or ability to frighten Paroxysmal Sweats: no sweat visible Visual Disturbances: very mild sensitivity Anxiety: mildly anxious Headache, Fullness in Head: very mild Agitation: normal activity Orientation and Clouding of Sensorium: disoriented for data by no more than 2 calendar days CIWA-Ar Total: 8 COWS:    Allergies:  Allergies  Allergen Reactions  . Ibuprofen Rash    Home Medications:  (Not in a hospital admission)  OB/GYN Status:  No LMP for male patient.  General Assessment Data [facility] T[facility] Assessment: In system Is this a Tele or Face-to-Face Assessment?: Face-to-Face Is this an Initial Assessment or a Re-assessment for this encounter?: Initial Assessment Marital status: Divorced VandlingMaiden name: na Is patient pregnant?: No Pregnancy Status: No Living Arrangements: Spouse/significant other Can pt return to current living arrangement?: Yes Admission Status: Voluntary Is patient capable of signing voluntary admission?: Yes Referral Source: Self/Family/Friend Insurance type: none     Crisis Care Plan Living Arrangements: Spouse/significant other Name of Psychiatrist: none Name of Therapist: none  Education Status Is patient currently in school?: No  Risk to self with the past 6 months Suicidal Ideation: No-Not Currently/Within Last 6 Months Has patient been a risk to self within the past 6 months prior to admission? : No Suicidal Intent: No-Not Currently/Within Last 6 Months Has patient had any suicidal intent within the past 6 months prior to admission? : No Is patient at risk for suicide?: No Suicidal Plan?: No-Not Currently/Within Last  6 Months Has patient had any suicidal plan within the past 6 months prior to admission? : No Access to Means: No What has been your use of drugs/alcohol within the last 12 months?: alcohol Previous  Attempts/Gestures: No How many times?: 0 Other Self Harm Risks: na Intentional Self Injurious Behavior: None Family Suicide History: No Recent stressful life event(s): Loss (Comment), Legal Issues, Other (Comment) (SA) Persecutory voices/beliefs?: No Depression: Yes Depression Symptoms: Guilt, Fatigue, Loss of interest in usual pleasures Substance abuse history and/or treatment for substance abuse?: Yes  Risk to Others within the past 6 months Homicidal Ideation: No-Not Currently/Within Last 6 Months Does patient have any lifetime risk of violence toward others beyond the six months prior to admission? : No Thoughts of Harm to Others: No-Not Currently Present/Within Last 6 Months Current Homicidal Intent: No-Not Currently/Within Last 6 Months Current Homicidal Plan: No-Not Currently/Within Last 6 Months Access to Homicidal Means: No Identified Victim: na History of harm to others?: No Assessment of Violence: None Noted Violent Behavior Description: na Does patient have access to weapons?: No Criminal Charges Pending?: Yes (DUI) Describe Pending Criminal Charges: DUI Does patient have a court date: Yes Court Date: 02/08/16 Is patient on probation?: No  Psychosis Hallucinations: None noted Delusions: None noted  Mental Status Report Appearance/Hygiene: Disheveled Eye Contact: Poor Motor Activity: Psychomotor retardation Speech: Slurred Level of Consciousness: Drowsy Mood: Depressed Affect: Depressed Anxiety Level: None Thought Processes: Coherent Judgement: Partial Orientation: Person, Place, Time, Situation Obsessive Compulsive Thoughts/Behaviors: None  Cognitive Functioning Concentration: Poor Memory: Remote Intact, Recent Intact IQ: Average Insight: Fair Impulse Control: Fair Appetite: Poor Weight Loss: 0 Weight Gain: 0 Sleep: No Change Total Hours of Sleep: 5 Vegetative Symptoms: None  ADLScreening [facility] Assessment Services) Patient's cognitive ability  adequate to safely complete daily activities?: Yes Patient able to express need for assistance with ADLs?: Yes Independently performs ADLs?: Yes (appropriate for developmental age)  Prior Inpatient Therapy Prior Inpatient Therapy: Yes Prior Therapy Dates: 2015 Prior Therapy Facilty/Provider(s): [facility] Reason for Treatment: SA  Prior Outpatient Therapy Prior Outpatient Therapy: Yes Prior Therapy Dates: 2016 Prior Therapy Facilty/Provider(s): [facility] Reason for Treatment: SA Does patient have an ACCT team?: No Does patient have Intensive In-House Services?  : No Does patient have [facility] services? : No Does patient have P4[facility] services?: No  ADL Screening (condition at time of admission) Patient's cognitive ability adequate to safely complete daily activities?: Yes Patient able to express need for assistance with ADLs?: Yes Independently performs ADLs?: Yes (appropriate for developmental age)       Abuse/Neglect Assessment (Assessment to be complete while patient is alone) Physical Abuse: Denies Verbal Abuse: Denies Sexual Abuse: Denies Exploitation of patient/patient's resources: Denies Self-Neglect: Denies Values / Beliefs Cultural Requests During Hospitalization: None Spiritual Requests During Hospitalization: None Consults Spiritual Care Consult Needed: No Social Work Consult Needed: No      Additional Information 1:1 In Past 12 Months?: No CIRT Risk: No Elopement Risk: No     Disposition:  Disposition Initial Assessment Completed for this Encounter: Yes Disposition of Patient: Other dispositions (Referred to [facility] for medical clearance) Other disposition(s): Other (Comment)  On Site Evaluation by:   Reviewed with Physician:    Maryelizabeth Rowan A 01/12/2016 12:52 PM

## 2016-01-12 NOTE — ED Notes (Signed)
Pt given cup of ice water refused any snacks

## 2016-02-07 MED FILL — ?LEVETIRACETAM 500 MG TABLE: 500 | 30 days supply | Qty: 60 | Fill #1

## 2016-03-12 ENCOUNTER — Ambulatory Visit: Payer: Self-pay | Admitting: Family Medicine

## 2016-03-22 ENCOUNTER — Other Ambulatory Visit: Payer: Self-pay | Admitting: Family Medicine

## 2016-03-31 ENCOUNTER — Telehealth: Payer: Self-pay

## 2016-03-31 ENCOUNTER — Ambulatory Visit: Payer: Self-pay | Admitting: Family Medicine

## 2016-03-31 MED ORDER — LEVETIRACETAM 500 MG PO TABS: 500.0000 mg | ORAL_TABLET | Freq: Two times a day (BID) | ORAL | Status: DC

## 2016-03-31 NOTE — Telephone Encounter (Signed)
Pt is requesting a medication refill for his Keppra. [facility] Thanks!

## 2016-03-31 NOTE — Telephone Encounter (Signed)
Refill sent into pharmacy for keppra. Thanks!

## 2016-04-02 MED FILL — levETIRAcetam 500 MG TABS: 500 | 30 days supply | Qty: 60 | Fill #0

## 2016-05-12 ENCOUNTER — Encounter: Payer: Self-pay | Admitting: Family Medicine

## 2016-05-12 ENCOUNTER — Ambulatory Visit (INDEPENDENT_AMBULATORY_CARE_PROVIDER_SITE_OTHER): Payer: Self-pay | Admitting: Family Medicine

## 2016-05-12 VITALS — BP 126/77 | HR 88 | Temp 99.2°F | Ht 70.0 in | Wt 178.0 lb

## 2016-05-12 DIAGNOSIS — G40909 Epilepsy, unspecified, not intractable, without status epilepticus: Secondary | ICD-10-CM

## 2016-05-12 MED ORDER — LEVETIRACETAM 500 MG PO TABS: 500.0000 mg | ORAL_TABLET | Freq: Two times a day (BID) | ORAL | 1 refills | Status: DC

## 2016-05-12 MED ORDER — LEVETIRACETAM 500 MG PO TABS: 500.0000 mg | ORAL_TABLET | Freq: Two times a day (BID) | ORAL | 2 refills | Status: DC

## 2016-05-14 ENCOUNTER — Encounter: Payer: Self-pay | Admitting: Family Medicine

## 2016-05-14 NOTE — Progress Notes (Signed)
Patient ID: Dwayne Bolton, male   DOB: 03/01/1976, 40 y.o.   MRN: [id number]   ROBERTJAMES Bolton, is a 40 y.o. male  TFT:[id number]  HCW:[id number]  DOB - [birth year]  CC:  Chief Complaint  Patient presents with  . Follow-up    follow up from seizure rx, he is doing well, off ETOH, no seizures since last visit, does not like side effects of Keppra, complaints of anxiety and bad dreams and decreased energy with rx       HPI: Dwayne Bolton is a 40 y.o. male here for follow-up seizure disorder. He has a history of alcohol and a recent seizure when he was in withdrawal. He does have a history of seizure disorder in the past. He was prescribed Keppra in ED recently and told to follow-up here. He reports no further seizure activity. He does complain of anxiety and bad dreams. He feels the Keppra is causing a decrease in energy.He denies current alcohol use. His only medication other than Keppra is a MVI.  Allergies  Allergen Reactions  . Ibuprofen Rash   Past Medical History:  Diagnosis Date  . Alcohol abuse   . Seizure [facility])    Current Outpatient Prescriptions on File Prior to Visit  Medication Sig Dispense Refill  . multivitamin (ONE-A-DAY MEN'S) TABS tablet Take 1 tablet by mouth daily. 30 tablet 0  . chlordiazePOXIDE (LIBRIUM) 25 MG capsule 25 mg PO TID x 1D, then 25  PO BID X 1D, then 25 g PO QD X 1D (Patient not taking: Reported on 05/12/2016) 6 capsule 0  . gabapentin (NEURONTIN) 100 MG capsule Take 1 capsule (100 mg total) by mouth 3 (three) times daily. (Patient not taking: Reported on 05/12/2016) 90 capsule 0  . thiamine (VITAMIN B-1) 100 MG tablet Take 1 tablet (100 mg total) by mouth daily. 30 tablet 0   No current facility-administered medications on file prior to visit.    Family History  Problem Relation Age of Onset  . Diabetes Mother   . Hypertension Mother    Social History   Social History  . Marital status: Single    Spouse name: N/A  . Number of children: N/A  .  Years of education: N/A   Occupational History  . Not on file.   Social History Main Topics  . Smoking status: Current Every Day Smoker    Packs/day: 0.50    Years: 4.00    Types: Cigarettes  . Smokeless tobacco: Current User  . Alcohol use No     Comment: drinks @ 9 beers to keep w/d s/s down  . Drug use: No  . Sexual activity: Not on file   Other Topics Concern  . Not on file   Social History Narrative  . No narrative on file    Review of Systems: Constitutional: Negative for fever, chills, appetite change, weight loss. Positive for fatigue Skin: Negative for rashes or lesions of concern. HENT: Negative for ear pain, ear discharge.nose bleeds Eyes: Negative for pain, discharge, redness, itching and visual disturbance. Neck: Negative for pain, stiffness Respiratory: Negative for cough, shortness of breath,   Cardiovascular: Negative for chest pain, palpitations and leg swelling. Gastrointestinal: Negative for abdominal pain, nausea, vomiting, diarrhea, constipations Genitourinary: Negative for dysuria, urgency, frequency, hematuria,  Musculoskeletal: Negative for back pain, joint pain, joint  swelling, and gait problem.Negative for weakness. Neurological: Negative for dizziness, tremors, syncope,   light-headedness, numbness and headaches. Positive for seizure disorder Hematological: Negative for easy bruising or bleeding Psychiatric/Behavioral:  Positive for anxiety.   Objective:   Vitals:   05/12/16 1334  BP: 126/77  Pulse: 88  Temp: 99.2 F (37.3 C)    Physical Exam: Constitutional: Patient appears well-developed and well-nourished. No distress. HENT: Normocephalic, atraumatic, External right and left ear normal. Oropharynx is clear and moist.  Eyes: Conjunctivae and EOM are normal. PERRLA, no scleral icterus. Neck: Normal ROM. Neck supple. No lymphadenopathy, No thyromegaly. CVS: RRR, S1/S2 +, no murmurs, no gallops, no rubs Pulmonary: Effort and breath  sounds normal, no stridor, rhonchi, wheezes, rales.  Abdominal: Soft. Normoactive BS,, no distension, tenderness, rebound or guarding.  Musculoskeletal: Normal range of motion. No edema and no tenderness.  Neuro: Alert.Normal muscle tone coordination. Non-focal Skin: Skin is warm and dry. No rash noted. Not diaphoretic. No erythema. No pallor. Psychiatric: Normal mood and affect. Behavior, judgment, thought content normal.  Lab Results  Component Value Date   WBC 15.0 (H) 12/28/2015   HGB 15.6 12/28/2015   HCT 45.5 12/28/2015   MCV 96.4 12/28/2015   PLT 390 12/28/2015   Lab Results  Component Value Date   CREATININE 0.93 12/28/2015   BUN 12 12/28/2015   NA 139 12/28/2015   K 4.5 12/28/2015   CL 100 12/28/2015   CO2 25 12/28/2015    Lab Results  Component Value Date   HGBA1C 5.7 (H) 03/22/2015   Lipid Panel  No results found for: CHOL, TRIG, HDL, CHOLHDL, VLDL, LDLCALC     Assessment and plan:   There are no diagnoses linked to this encounter.  No Follow-up on file.  The patient was given clear instructions to go to ER or return to medical center if symptoms don't improve, worsen or new problems develop. The patient verbalized understanding.    Henrietta Hoover FNP  05/14/2016, 11:05 AM

## 2016-06-10 ENCOUNTER — Encounter (HOSPITAL_COMMUNITY): Payer: Self-pay

## 2016-06-10 DIAGNOSIS — F17210 Nicotine dependence, cigarettes, uncomplicated: Secondary | ICD-10-CM | POA: Insufficient documentation

## 2016-06-10 DIAGNOSIS — E860 Dehydration: Secondary | ICD-10-CM | POA: Insufficient documentation

## 2016-06-10 LAB — COMPREHENSIVE METABOLIC PANEL
ALBUMIN: 4.9 g/dL (ref 3.5–5.0)
ALT: 70 U/L — ABNORMAL HIGH (ref 17–63)
ANION GAP: 19 — AB (ref 5–15)
AST: 85 U/L — ABNORMAL HIGH (ref 15–41)
Alkaline Phosphatase: 75 U/L (ref 38–126)
BILIRUBIN TOTAL: 1.6 mg/dL — AB (ref 0.3–1.2)
BUN: 12 mg/dL (ref 6–20)
CO2: 21 mmol/L — ABNORMAL LOW (ref 22–32)
Calcium: 8.9 mg/dL (ref 8.9–10.3)
Chloride: 89 mmol/L — ABNORMAL LOW (ref 101–111)
Creatinine, Ser: 1.05 mg/dL (ref 0.61–1.24)
GFR calc Af Amer: >60 mL/min (ref >60)
Glucose, Bld: 96 mg/dL (ref 65–99)
POTASSIUM: 4.4 mmol/L (ref 3.5–5.1)
Sodium: 129 mmol/L — ABNORMAL LOW (ref 135–145)
TOTAL PROTEIN: 7.5 g/dL (ref 6.5–8.1)

## 2016-06-10 LAB — CBC
HEMATOCRIT: 45 % (ref 39.0–52.0)
HEMOGLOBIN: 16.1 g/dL (ref 13.0–17.0)
MCH: 31.9 pg (ref 26.0–34.0)
MCHC: 35.8 g/dL (ref 30.0–36.0)
MCV: 89.1 fL (ref 78.0–100.0)
Platelets: 166 10*3/uL (ref 150–400)
RBC: 5.05 MIL/uL (ref 4.22–5.81)
RDW: 12.4 % (ref 11.5–15.5)
WBC: 10.1 10*3/uL (ref 4.0–10.5)

## 2016-06-10 LAB — URINALYSIS, ROUTINE W REFLEX MICROSCOPIC
Glucose, UA: NEGATIVE mg/dL
Ketones, ur: >80 mg/dL — AB
LEUKOCYTES UA: NEGATIVE
NITRITE: NEGATIVE
PH: 5.5 (ref 5.0–8.0)
Protein, ur: >300 mg/dL — AB
SPECIFIC GRAVITY, URINE: 1.022 (ref 1.005–1.030)

## 2016-06-10 LAB — URINE MICROSCOPIC-ADD ON

## 2016-06-10 LAB — LIPASE, BLOOD: Lipase: 30 U/L (ref 11–51)

## 2016-06-10 MED ORDER — ONDANSETRON 4 MG PO TBDP
ORAL_TABLET | ORAL | Status: AC
  Filled 2016-06-10: qty 1

## 2016-06-10 MED ORDER — ONDANSETRON 4 MG PO TBDP
4.0000 mg | ORAL_TABLET | Freq: Once | ORAL | Status: AC | PRN
  Administered 2016-06-10: 4 mg via ORAL

## 2016-06-10 NOTE — ED Triage Notes (Signed)
Pt reports onset 4 days ago vomiting, x 2 today, bright red blood after eating nachos. Pt has not ate since 4 days ago.  Pt reports sleeping in truck several days last week and could be dehydrated.  Pt has been drinking alcohol, drinks 12 pack beer a day.

## 2016-06-11 ENCOUNTER — Emergency Department (HOSPITAL_COMMUNITY)
Admission: EM | Admit: 2016-06-11 | Discharge: 2016-06-11 | Disposition: A | Discharge status: Home / Self Care | Payer: Self-pay | Attending: Emergency Medicine | Admitting: Emergency Medicine

## 2016-06-11 DIAGNOSIS — R112 Nausea with vomiting, unspecified: Secondary | ICD-10-CM

## 2016-06-11 DIAGNOSIS — E860 Dehydration: Secondary | ICD-10-CM

## 2016-06-11 LAB — BASIC METABOLIC PANEL
ANION GAP: 15 (ref 5–15)
BUN: 10 mg/dL (ref 6–20)
CHLORIDE: 97 mmol/L — AB (ref 101–111)
CO2: 21 mmol/L — AB (ref 22–32)
Calcium: 8.1 mg/dL — ABNORMAL LOW (ref 8.9–10.3)
Creatinine, Ser: 0.94 mg/dL (ref 0.61–1.24)
GFR calc non Af Amer: >60 mL/min (ref >60)
Glucose, Bld: 86 mg/dL (ref 65–99)
Potassium: 4.2 mmol/L (ref 3.5–5.1)
Sodium: 133 mmol/L — ABNORMAL LOW (ref 135–145)

## 2016-06-11 MED ORDER — SODIUM CHLORIDE 0.9 % IV BOLUS (SEPSIS)
1000.0000 mL | Freq: Once | INTRAVENOUS | Status: AC
  Administered 2016-06-11: 1000 mL via INTRAVENOUS

## 2016-06-11 MED ORDER — ONDANSETRON 4 MG PO TBDP: 4.0000 mg | ORAL_TABLET | Freq: Three times a day (TID) | ORAL | 0 refills | Status: DC | PRN

## 2016-06-11 MED FILL — ONDANSETRON ODT 4 MG TABLET: 4 | 6 days supply | Qty: 20 | Fill #0

## 2016-06-11 MED FILL — ?LEVETIRACETAM 500 MG TABLE: 500 | 30 days supply | Qty: 60 | Fill #0

## 2016-06-11 NOTE — ED Provider Notes (Signed)
[facility]-EMERGENCY DEPT Provider Note   CSN: [id number] Arrival date & time: 06/10/16  2034     History   Chief Complaint Chief Complaint  Patient presents with  . Vomiting    HPI Dwayne IsaacsROBERT Bolton is a 40 y.o. male.  Patient with history of alcohol dependence, seizures, anxiety presents with perceived dehydration. He reports nausea preventing significant PO intake, vomiting with any solid foods. He is able to drink small amounts of fluids. He states he has been living in his truck for the past several days in the heat with air conditioning. He endorses continued alcohol use. No hematemesis, fever, diarrhea, melena or hematochezia. No syncope or near syncope.   The history is provided by the patient. No language interpreter was used.    Past Medical History:  Diagnosis Date  . Alcohol abuse   . Seizure [facility](HCC)     Patient Active Problem List   Diagnosis Date Noted  . Tobacco dependence 12/31/2015  . Seizure disorder (HCC) 12/31/2015  . Convulsion (HCC)   . Alcohol withdrawal seizure (HCC) 03/22/2015  . GAD (generalized anxiety disorder) 01/13/2014  . Adjustment disorder with mixed anxiety and depressed mood 01/13/2014  . Seizures (HCC) 01/12/2014  . Elevated liver enzymes 01/12/2014  . Alcohol dependence (HCC) 01/11/2014    History reviewed. No pertinent surgical history.     Home Medications    Prior to Admission medications   Medication Sig Start Date End Date Taking? Authorizing Provider  chlordiazePOXIDE (LIBRIUM) 25 MG capsule 25 mg PO TID x 1D, then 25  PO BID X 1D, then 25 g PO QD X 1D Patient not taking: Reported on 05/12/2016 01/12/16   Cathren LaineKevin Steinl, MD  gabapentin (NEURONTIN) 100 MG capsule Take 1 capsule (100 mg total) by mouth 3 (three) times daily. Patient not taking: Reported on 05/12/2016 01/01/16   Massie MaroonLachina M Hollis, FNP  levETIRAcetam (KEPPRA) 500 MG tablet Take 1 tablet (500 mg total) by mouth 2 (two) times daily. 05/12/16   Henrietta HooverLinda C Bernhardt, NP    multivitamin (ONE-A-DAY MEN'S) TABS tablet Take 1 tablet by mouth daily. 03/25/15   Shanker Levora DredgeM Ghimire, MD  thiamine (VITAMIN B-1) 100 MG tablet Take 1 tablet (100 mg total) by mouth daily. 03/25/15   Shanker Levora DredgeM Ghimire, MD    Family History Family History  Problem Relation Age of Onset  . Diabetes Mother   . Hypertension Mother     Social History Social History  Substance Use Topics  . Smoking status: Current Every Day Smoker    Packs/day: 0.50    Years: 4.00    Types: Cigarettes  . Smokeless tobacco: Current User  . Alcohol use No     Comment: 12 pack a day     Allergies   Ibuprofen   Review of Systems Review of Systems  Constitutional: Negative for chills and fever.  Respiratory: Negative.   Cardiovascular: Negative.   Gastrointestinal: Positive for nausea and vomiting. Negative for abdominal pain, blood in stool and diarrhea.  Musculoskeletal: Negative.   Neurological: Positive for weakness. Negative for syncope.     Physical Exam Updated Vital Signs BP 151/92 (BP Location: Left Arm)   Pulse 93   Temp 98.7 F (37.1 C) (Oral)   Resp 20   Ht 5\' 11"  (1.803 m)   Wt 81.4 kg   SpO2 98%   BMI 25.02 kg/m   Physical Exam  Constitutional: He appears well-developed and well-nourished.  Not apparently acutely intoxicated.  HENT:  Head: Normocephalic.  Neck:  Normal range of motion. Neck supple.  Cardiovascular: Normal rate and regular rhythm.   Pulmonary/Chest: Effort normal and breath sounds normal.  Abdominal: Soft. Bowel sounds are normal. There is no tenderness. There is no rebound and no guarding.  Musculoskeletal: Normal range of motion.  Neurological: He is alert. No cranial nerve deficit.  Skin: Skin is warm and dry. No rash noted.  Psychiatric: He has a normal mood and affect.     ED Treatments / Results  Labs (all labs ordered are listed, but only abnormal results are displayed) Labs Reviewed  COMPREHENSIVE METABOLIC PANEL - Abnormal; Notable for  the following:       Result Value   Sodium 129 (*)    Chloride 89 (*)    CO2 21 (*)    AST 85 (*)    ALT 70 (*)    Total Bilirubin 1.6 (*)    Anion gap 19 (*)    All other components within normal limits  URINALYSIS, ROUTINE W REFLEX MICROSCOPIC (NOT AT [facility]) - Abnormal; Notable for the following:    APPearance CLOUDY (*)    Hgb urine dipstick LARGE (*)    Bilirubin Urine SMALL (*)    Ketones, ur >80 (*)    Protein, ur >300 (*)    All other components within normal limits  URINE MICROSCOPIC-ADD ON - Abnormal; Notable for the following:    Squamous Epithelial / LPF 0-5 (*)    Bacteria, UA FEW (*)    Casts GRANULAR CAST (*)    All other components within normal limits  LIPASE, BLOOD  CBC    EKG  EKG Interpretation None       Radiology No results found.  Procedures Procedures (including critical care time)  Medications Ordered in ED Medications  ondansetron (ZOFRAN-ODT) disintegrating tablet 4 mg (4 mg Oral Given 06/10/16 2113)     Initial Impression / Assessment and Plan / ED Course  I have reviewed the triage vital signs and the nursing notes.  Pertinent labs & imaging results that were available during my care of the patient were reviewed by me and considered in my medical decision making (see chart for details).  Clinical Course    Patient complains of nausea and vomiting for the past 2-3 days. He has been exposed to heat and humidity, has had decreased PO intake and is an alcoholic. IVF's provided with improvement in patient's symptoms. He was mildly acidotic on initial labs. Repeat testing after fluids shows improvement in acidosis. His vital signs are normal. He appears more comfortable and is considered stable for discharge.   Final Clinical Impressions(s) / ED Diagnoses   Final diagnoses:  None   1. Dehydration 2. Nausea and vomiting  New Prescriptions New Prescriptions   No medications on file     Elpidio AnisShari Briahna Pescador, Cordelia Poche-C 06/11/16 [id number]      Gilda Creasehristopher J Pollina, MD 06/13/16 [phone]

## 2016-06-11 NOTE — ED Notes (Signed)
Pt states that he drinks 2-3 beers a day, family reports about 6 40oz daily. Pt states that he passed out for two days.

## 2016-07-16 MED FILL — levETIRAcetam 500 MG TABS: 500 | 30 days supply | Qty: 60 | Fill #1

## 2016-08-14 MED FILL — levETIRAcetam 500 MG TABS: 500 | 30 days supply | Qty: 60 | Fill #2

## 2016-08-18 ENCOUNTER — Ambulatory Visit: Payer: Self-pay | Admitting: Family Medicine

## 2016-08-21 ENCOUNTER — Ambulatory Visit (HOSPITAL_COMMUNITY): Payer: Self-pay | Admitting: Psychiatry

## 2016-09-09 ENCOUNTER — Telehealth: Payer: Self-pay | Admitting: Family Medicine

## 2016-09-09 ENCOUNTER — Other Ambulatory Visit: Payer: Self-pay | Admitting: Family Medicine

## 2016-09-09 MED ORDER — LEVETIRACETAM 500 MG PO TABS: 500.0000 mg | ORAL_TABLET | Freq: Two times a day (BID) | ORAL | 1 refills | Status: DC

## 2016-09-09 MED FILL — levETIRAcetam 500 MG TABS: 500 | 30 days supply | Qty: 60 | Fill #0

## 2016-09-09 NOTE — Telephone Encounter (Signed)
Patient called the office to request medication refill for levETIRAcetam (KEPPRA) 500 MG tablet and ibuprofen. Please call prescription to our pharmacy.   Thank you.

## 2016-09-17 ENCOUNTER — Other Ambulatory Visit: Payer: Self-pay | Admitting: Family Medicine

## 2016-09-17 DIAGNOSIS — M549 Dorsalgia, unspecified: Principal | ICD-10-CM

## 2016-09-17 DIAGNOSIS — G8929 Other chronic pain: Secondary | ICD-10-CM

## 2016-10-03 ENCOUNTER — Ambulatory Visit (INDEPENDENT_AMBULATORY_CARE_PROVIDER_SITE_OTHER): Payer: Self-pay | Admitting: Family Medicine

## 2016-10-03 ENCOUNTER — Encounter: Payer: Self-pay | Admitting: Family Medicine

## 2016-10-03 VITALS — BP 132/84 | HR 72 | Temp 98.0°F | Resp 14 | Ht 70.0 in | Wt 191.0 lb

## 2016-10-03 DIAGNOSIS — G40909 Epilepsy, unspecified, not intractable, without status epilepticus: Secondary | ICD-10-CM

## 2016-10-03 DIAGNOSIS — F17200 Nicotine dependence, unspecified, uncomplicated: Secondary | ICD-10-CM

## 2016-10-03 DIAGNOSIS — G8929 Other chronic pain: Secondary | ICD-10-CM

## 2016-10-03 DIAGNOSIS — M545 Low back pain: Secondary | ICD-10-CM

## 2016-10-03 LAB — COMPLETE METABOLIC PANEL WITH GFR
ALK PHOS: 81 U/L (ref 40–115)
ALT: 28 U/L (ref 9–46)
AST: 26 U/L (ref 10–40)
Albumin: 4.5 g/dL (ref 3.6–5.1)
BILIRUBIN TOTAL: 0.5 mg/dL (ref 0.2–1.2)
BUN: 13 mg/dL (ref 7–25)
CALCIUM: 9.6 mg/dL (ref 8.6–10.3)
CHLORIDE: 103 mmol/L (ref 98–110)
CO2: 29 mmol/L (ref 20–31)
CREATININE: 1.09 mg/dL (ref 0.60–1.35)
GFR, Est Non African American: 86 mL/min (ref >=60)
Glucose, Bld: 100 mg/dL — ABNORMAL HIGH (ref 65–99)
Potassium: 4.1 mmol/L (ref 3.5–5.3)
Sodium: 140 mmol/L (ref 135–146)
Total Protein: 7.1 g/dL (ref 6.1–8.1)

## 2016-10-03 LAB — CBC WITH DIFFERENTIAL/PLATELET
BASOS PCT: 0 %
Basophils Absolute: 0 cells/uL (ref 0–200)
EOS PCT: 1 %
Eosinophils Absolute: 116 cells/uL (ref 15–500)
HCT: 45.2 % (ref 38.5–50.0)
Hemoglobin: 15.4 g/dL (ref 13.2–17.1)
LYMPHS PCT: 21 %
Lymphs Abs: 2436 cells/uL (ref 850–3900)
MCH: 32.1 pg (ref 27.0–33.0)
MCHC: 34.1 g/dL (ref 32.0–36.0)
MCV: 94.2 fL (ref 80.0–100.0)
MONO ABS: 696 {cells}/uL (ref 200–950)
MONOS PCT: 6 %
MPV: 9.2 fL (ref 7.5–12.5)
Neutro Abs: 8352 cells/uL — ABNORMAL HIGH (ref 1500–7800)
Neutrophils Relative %: 72 %
PLATELETS: 268 10*3/uL (ref 140–400)
RBC: 4.8 MIL/uL (ref 4.20–5.80)
RDW: 14.4 % (ref 11.0–15.0)
WBC: 11.6 10*3/uL — AB (ref 3.8–10.8)

## 2016-10-03 MED ORDER — LEVETIRACETAM 500 MG PO TABS: 500.0000 mg | ORAL_TABLET | Freq: Two times a day (BID) | ORAL | 11 refills | Status: DC

## 2016-10-03 NOTE — Progress Notes (Signed)
Subjective:    Patient ID: Dwayne Bolton, male    DOB: [birth year], 40 y.o.   MRN: [id number]  HPI Mr. Dwayne Bolton,a 40 year old male presents for a follow up of seizure disorder. Symptoms have been controlled on Kepra BID. He says that he has not had any seizure activity since  February 2017. He maintains that he initially started having seizures following a head injury in 2015. He does not have a history of illicit drug use, but was an every other day alcohol user. He denies a history of developmental delay and does not have a family history of seizures.  He states that he was evaluated by neurology in the past, but did not return for follow up.  He is a chronic everyday smoker. He maintains that he smokes 0.5 packs per day. He is not interested in smoking cessation at this time.   He is also complaining of chronic back pain. He states that back pain started several months ago. It is worsened by increased standing, bending, and pivoting movements. He denies recent back injury. He states that pain radiates to thighs bilaterally. He states that he has not used any OTC interventions to alleviate symptoms.  .  Past Medical History:  Diagnosis Date  . Alcohol abuse   . Seizure [facility](HCC)   History reviewed. No pertinent surgical history.  Social History   Social History  . Marital status: Single    Spouse name: N/A  . Number of children: N/A  . Years of education: N/A   Occupational History  . Not on file.   Social History Main Topics  . Smoking status: Current Every Day Smoker    Packs/day: 0.25    Years: 4.00    Types: Cigarettes  . Smokeless tobacco: Former NeurosurgeonUser  . Alcohol use No     Comment: every once in a while   . Drug use: No  . Sexual activity: Not on file   Other Topics Concern  . Not on file   Social History Narrative  . No narrative on file   Immunization History  Administered Date(s) Administered  . Influenza-Unspecified 06/20/2016   Review of Systems   Constitutional: Negative.  Negative for unexpected weight change.  HENT: Negative.   Eyes: Positive for visual disturbance.  Respiratory: Negative.  Negative for shortness of breath.   Cardiovascular: Negative.   Endocrine: Negative.  Negative for polydipsia, polyphagia and polyuria.  Genitourinary: Negative.  Negative for dysuria and flank pain.  Musculoskeletal: Positive for back pain (tail bone).  Skin: Negative.   Allergic/Immunologic: Negative.   Neurological: Positive for seizures. Negative for dizziness, speech difficulty, weakness, light-headedness, numbness and headaches.  Hematological: Negative.   Psychiatric/Behavioral: Negative for sleep disturbance and suicidal ideas. The patient is nervous/anxious.        Objective:   Physical Exam  Constitutional: He is oriented to person, place, and time.  HENT:  Head: Normocephalic and atraumatic.  Right Ear: External ear normal.  Left Ear: External ear normal.  Nose: Nose normal.  Mouth/Throat: Oropharynx is clear and moist.  Eyes: Conjunctivae and EOM are normal. Pupils are equal, round, and reactive to light.  Neck: Normal range of motion. Neck supple.  Pulmonary/Chest: Effort normal and breath sounds normal.  Abdominal: Soft. Bowel sounds are normal.  Musculoskeletal:       Lumbar back: He exhibits decreased range of motion and pain.  Neurological: He is alert and oriented to person, place, and time. He has normal reflexes.  Skin: Skin is warm and dry.  Psychiatric: He has a normal mood and affect. His behavior is normal. Judgment and thought content normal.     BP (!) 143/89 (BP Location: Right Arm, Patient Position: Sitting, Cuff Size: Normal)   Pulse 72   Temp 98 F (36.7 C) (Oral)   Resp 14   Ht 5\' 10"  (1.778 m)   Wt 191 lb (86.6 kg)   SpO2 100%   BMI 27.41 kg/m   Assessment & Plan  1. Seizure disorder (HCC) - COMPLETE METABOLIC PANEL WITH GFR - CBC with Differential - levETIRAcetam (KEPPRA) 500 MG tablet;  Take 1 tablet (500 mg total) by mouth 2 (two) times daily.  Dispense: 60 tablet; Refill: 11  2. Tobacco dependence Smoking cessation instruction/counseling given:  counseled patient on the dangers of tobacco use, advised patient to stop smoking, and reviewed strategies to maximize success 3. Chronic low back pain, unspecified back pain laterality, with sciatica presence unspecified  - Ambulatory referral to Physical Therapy    RTC: 6 months for seizure disorder   Carter Kaman M, FNP   The patient was given clear instructions to go to ER or return to medical center if symptoms do not improve, worsen or new problems develop. The patient verbalized understanding. Will notify patient with laboratory results.

## 2016-10-03 NOTE — Patient Instructions (Addendum)
Chronic Back Pain When back pain lasts longer than 3 months, it is called chronic back pain.The cause of your back pain may not be known. Some common causes include:  Wear and tear (degenerative disease) of the bones, ligaments, or disks in your back.  Inflammation and stiffness in your back (arthritis). People who have chronic back pain often go through certain periods in which the pain is more intense (flare-ups). Many people can learn to manage the pain with home care. Follow these instructions at home: Pay attention to any changes in your symptoms. Take these actions to help with your pain: Activity  Avoid bending and activities that make the problem worse.  Do not sit or stand in one place for long periods of time.  Take brief periods of rest throughout the day. This will reduce your pain. Resting in a lying or standing position is usually better than sitting to rest.  When you are resting for longer periods, mix in some mild activity or stretching between periods of rest. This will help to prevent stiffness and pain.  Get regular exercise. Ask your health care provider what activities are safe for you.  Do not lift anything that is heavier than 10 lb (4.5 kg). Always use proper lifting technique, which includes:  Bending your knees.  Keeping the load close to your body.  Avoiding twisting. Managing pain  If directed, apply ice to the painful area. Your health care provider may recommend applying ice during the first 24-48 hours after a flare-up begins.  Put ice in a plastic bag.  Place a towel between your skin and the bag.  Leave the ice on for 20 minutes, 2-3 times per day.  After icing, apply heat to the affected area as often as told by your health care provider. Use the heat source that your health care provider recommends, such as a moist heat pack or a heating pad.  Place a towel between your skin and the heat source.  Leave the heat on for 20-30  minutes.  Remove the heat if your skin turns bright red. This is especially important if you are unable to feel pain, heat, or cold. You may have a greater risk of getting burned.  Try soaking in a warm tub.  Take over-the-counter and prescription medicines only as told by your health care provider.  Keep all follow-up visits as told by your health care provider. This is important. Contact a health care provider if:  You have pain that is not relieved with rest or medicine. Get help right away if:  You have weakness or numbness in one or both of your legs or feet.  You have trouble controlling your bladder or your bowels.  You have nausea or vomiting.  You have pain in your abdomen.  You have shortness of breath or you faint. This information is not intended to replace advice given to you by your health care provider. Make sure you discuss any questions you have with your health care provider. Document Released: 11/13/2004 Document Revised: 02/14/2016 Document Reviewed: 03/26/2015 Elsevier Interactive Patient Education  2017 [employer]vier Inc.  Tobacco Use Disorder Tobacco use disorder (TUD) is a mental disorder. It is the long-term use of tobacco in spite of related health problems or difficulty with normal life activities. Tobacco is most commonly smoked as cigarettes and less commonly as cigars or pipes. Smokeless chewing tobacco and snuff are also popular. People with TUD get a feeling of extreme pleasure (euphoria) from using tobacco  and have a desire to use it again and again. Repeated use of tobacco can cause problems. The addictive effects of tobacco are due mainly tothe ingredient nicotine. Nicotine also causes a rush of adrenaline (epinephrine) in the body. This leads to increased blood pressure, heart rate, and breathing rate. These changes may cause problems for people with high blood pressure, weak hearts, or lung disease. High doses of nicotine in children and pets can lead to  seizures and death. Tobacco contains a number of other unsafe chemicals. These chemicals are especially harmful when inhaled as smoke and can damage almost every organ in the body. Smokers live shorter lives than nonsmokers and are at risk of dying from a number of diseases and cancers. Tobacco smoke can also cause health problems for nonsmokers (due to inhaling secondhand smoke). Smoking is also a fire hazard. TUD usually starts in the late teenage years and is most common in young adults between the ages of 14 and 25 years. People who start smoking earlier in life are more likely to continue smoking as adults. TUD is somewhat more common in men than women. People with TUD are at higher risk for using alcohol and other drugs of abuse. What increases the risk? Risk factors for TUD include:  Having family members with the disorder.  Being around people who use tobacco.  Having an existing mental health issue such as schizophrenia, depression, bipolar disorder, ADHD, or posttraumatic stress disorder (PTSD). What are the signs or symptoms? People with tobacco use disorder have two or more of the following signs and symptoms within 12 months:  Use of more tobacco over a longer period than intended.  Not able to cut down or control tobacco use.  A lot of time spent obtaining or using tobacco.  Strong desire or urge to use tobacco (craving). Cravings may last for 6 months or longer after quitting.  Use of tobacco even when use leads to major problems at work, school, or home.  Use of tobacco even when use leads to relationship problems.  Giving up or cutting down on important life activities because of tobacco use.  Repeatedly using tobacco in situations where it puts you or others in physical danger, like smoking in bed.  Use of tobacco even when it is known that a physical or mental problem is likely related to tobacco use.  Physical problems are numerous and may include chronic  bronchitis, emphysema, lung and other cancers, gum disease, high blood pressure, heart disease, and stroke.  Mental problems caused by tobacco may include difficulty sleeping and anxiety.  Need to use greater amounts of tobacco to get the same effect. This means you have developed a tolerance.  Withdrawal symptoms as a result of stopping or rapidly cutting back use. These symptoms may last a month or more after quitting and include the following:  Depressed, anxious, or irritable mood.  Difficulty concentrating.  Increased appetite.  Restlessness or trouble sleeping.  Use of tobacco to avoid withdrawal symptoms. How is this diagnosed? Tobacco use disorder is diagnosed by your health care provider. A diagnosis may be made by:  Your health care provider asking questions about your tobacco use and any problems it may be causing.  A physical exam.  Lab tests.  You may be referred to a mental health professional or addiction specialist. The severity of tobacco use disorder depends on the number of signs and symptoms you have:  Mild-Two or three symptoms.  Moderate-Four or five symptoms.  Severe-Six  or more symptoms. How is this treated? Many people with tobacco use disorder are unable to quit on their own and need help. Treatment options include the following:  Nicotine replacement therapy (NRT). NRT provides nicotine without the other harmful chemicals in tobacco. NRT gradually lowers the dosage of nicotine in the body and reduces withdrawal symptoms. NRT is available in over-the-counter forms (gum, lozenges, and skin patches) as well as prescription forms (mouth inhaler and nasal spray).  Medicines.This may include:  Antidepressant medicine that may reduce nicotine cravings.  A medicine that acts on nicotine receptors in the brain to reduce cravings and withdrawal symptoms. It may also block the effects of tobacco in people with TUD who relapse.  Counseling or talk therapy.  A form of talk therapy called behavioral therapy is commonly used to treat people with TUD. Behavioral therapy looks at triggers for tobacco use, how to avoid them, and how to cope with cravings. It is most effective in person or by phone but is also available in self-help forms (books and Internet websites).  Support groups. These provide emotional support, advice, and guidance for quitting tobacco. The most effective treatment for TUD is usually a combination of medicine, talk therapy, and support groups. Follow these instructions at home:  Keep all follow-up visits as directed by your health care provider. This is important.  Take medicines only as directed by your health care provider.  Check with your health care provider before starting new prescription or over-the-counter medicines. Contact a health care provider if:  You are not able to take your medicines as prescribed.  Treatment is not helping your TUD and your symptoms get worse. Get help right away if:  You have serious thoughts about hurting yourself or others.  You have trouble breathing, chest pain, sudden weakness, or sudden numbness in part of your body. This information is not intended to replace advice given to you by your health care provider. Make sure you discuss any questions you have with your health care provider. Document Released: 06/11/2004 Document Revised: 06/08/2016 Document Reviewed: 12/02/2013 Elsevier Interactive Patient Education  2017 [employer]

## 2016-10-06 ENCOUNTER — Ambulatory Visit: Payer: No Typology Code available for payment source | Attending: Family Medicine

## 2016-10-24 MED FILL — levETIRAcetam 500 MG TABS: 500 | 30 days supply | Qty: 60 | Fill #0 | Status: TO

## 2016-10-27 ENCOUNTER — Emergency Department (HOSPITAL_COMMUNITY): Payer: BLUE CROSS/BLUE SHIELD

## 2016-10-27 ENCOUNTER — Encounter (HOSPITAL_COMMUNITY): Payer: Self-pay | Admitting: Emergency Medicine

## 2016-10-27 ENCOUNTER — Other Ambulatory Visit: Payer: Self-pay | Admitting: *Deleted

## 2016-10-27 ENCOUNTER — Emergency Department (HOSPITAL_COMMUNITY)
Admission: EM | Admit: 2016-10-27 | Discharge: 2016-10-27 | Disposition: A | Discharge status: Home / Self Care | Payer: BLUE CROSS/BLUE SHIELD | Attending: Emergency Medicine | Admitting: Emergency Medicine

## 2016-10-27 DIAGNOSIS — Y939 Activity, unspecified: Secondary | ICD-10-CM | POA: Insufficient documentation

## 2016-10-27 DIAGNOSIS — Y999 Unspecified external cause status: Secondary | ICD-10-CM | POA: Insufficient documentation

## 2016-10-27 DIAGNOSIS — F17210 Nicotine dependence, cigarettes, uncomplicated: Secondary | ICD-10-CM | POA: Diagnosis not present

## 2016-10-27 DIAGNOSIS — S161XXA Strain of muscle, fascia and tendon at neck level, initial encounter: Secondary | ICD-10-CM

## 2016-10-27 DIAGNOSIS — R202 Paresthesia of skin: Secondary | ICD-10-CM

## 2016-10-27 DIAGNOSIS — G40909 Epilepsy, unspecified, not intractable, without status epilepticus: Secondary | ICD-10-CM

## 2016-10-27 DIAGNOSIS — Y92410 Unspecified street and highway as the place of occurrence of the external cause: Secondary | ICD-10-CM | POA: Diagnosis not present

## 2016-10-27 DIAGNOSIS — Z79899 Other long term (current) drug therapy: Secondary | ICD-10-CM | POA: Insufficient documentation

## 2016-10-27 DIAGNOSIS — S199XXA Unspecified injury of neck, initial encounter: Secondary | ICD-10-CM | POA: Diagnosis present

## 2016-10-27 MED ORDER — MELOXICAM 15 MG PO TABS: 15.0000 mg | ORAL_TABLET | Freq: Every day | ORAL | 0 refills | Status: DC

## 2016-10-27 MED ORDER — LEVETIRACETAM 500 MG PO TABS: 500.0000 mg | ORAL_TABLET | Freq: Two times a day (BID) | ORAL | 3 refills | Status: DC

## 2016-10-27 MED ORDER — CYCLOBENZAPRINE HCL 10 MG PO TABS: 10.0000 mg | ORAL_TABLET | Freq: Two times a day (BID) | ORAL | 0 refills | Status: DC | PRN
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
## 2016-10-27 NOTE — Telephone Encounter (Signed)
PRINTED FOR THE PASS PROGRAM 

## 2016-10-27 NOTE — Discharge Instructions (Signed)
YOUR CT SCAN WAS NEGATIVE FOR ANY ACUTE ABNORMALITY.  Return to the emergency department immediately if you develop any of the following symptoms: You have numbness, tingling, or weakness in the arms or legs. You develop severe headaches not relieved with medicine. You have severe neck pain, especially tenderness in the middle of the back of your neck. You have changes in bowel or bladder control. There is increasing pain in any area of the body. You have shortness of breath, light-headedness, dizziness, or fainting. You have chest pain. You feel sick to your stomach (nauseous), throw up (vomit), or sweat. You have increasing abdominal discomfort. There is blood in your urine, stool, or vomit. You have pain in your shoulder (shoulder strap areas). You feel your symptoms are getting worse.  You have neck pain from a cervical strain injury.  SEEK IMMEDIATE MEDICAL ATTENTION IF: You develop difficulties swallowing or breathing.  You have new or worse numbness, weakness, tingling, or movement problems in your arms or legs.  You develop increasing pain which is uncontrolled with medications.  You have change in bowel or bladder function, or other concerns.

## 2016-11-28 MED FILL — levETIRAcetam 500 MG TABS: 500 | 30 days supply | Qty: 60 | Fill #1

## 2016-12-26 MED FILL — levETIRAcetam 500 MG TABS: 500 | 30 days supply | Qty: 60 | Fill #0

## 2017-01-28 MED FILL — levETIRAcetam 500 MG TABS: 500 | 30 days supply | Qty: 60 | Fill #1

## 2017-02-02 ENCOUNTER — Encounter (HOSPITAL_COMMUNITY): Payer: Self-pay | Admitting: Emergency Medicine

## 2017-02-02 ENCOUNTER — Emergency Department (HOSPITAL_COMMUNITY)
Admission: EM | Admit: 2017-02-02 | Discharge: 2017-02-02 | Disposition: A | Discharge status: Home / Self Care | Payer: BLUE CROSS/BLUE SHIELD | Attending: Emergency Medicine | Admitting: Emergency Medicine

## 2017-02-02 DIAGNOSIS — R531 Weakness: Secondary | ICD-10-CM | POA: Insufficient documentation

## 2017-02-02 DIAGNOSIS — F17210 Nicotine dependence, cigarettes, uncomplicated: Secondary | ICD-10-CM | POA: Insufficient documentation

## 2017-02-02 DIAGNOSIS — R110 Nausea: Secondary | ICD-10-CM | POA: Diagnosis not present

## 2017-02-02 DIAGNOSIS — Z79899 Other long term (current) drug therapy: Secondary | ICD-10-CM | POA: Insufficient documentation

## 2017-02-02 MED ORDER — ONDANSETRON 4 MG PO TBDP: 4.0000 mg | ORAL_TABLET | Freq: Three times a day (TID) | ORAL | 0 refills | Status: DC | PRN

## 2017-02-02 MED ORDER — ONDANSETRON 4 MG PO TBDP
8.0000 mg | ORAL_TABLET | Freq: Once | ORAL | Status: AC
  Administered 2017-02-02: 8 mg via ORAL
  Filled 2017-02-02: qty 2

## 2017-02-02 NOTE — ED Provider Notes (Signed)
[facility]-EMERGENCY DEPT Provider Note   CSN: [id number] Arrival date & time: 02/02/17  0349     History   Chief Complaint Chief Complaint  Patient presents with  . Nausea  . Weakness    HPI Dwayne Bolton is a 41 y.o. male.  Patient presents with report of nausea, near vomiting, yesterday after getting overheated at work. No syncope. Nausea has persisted since onset. No fever, abdominal pain, diarrhea, known sick contacts. No chest pain, cough or SOB.   The history is provided by the patient. No language interpreter was used.  Weakness  Pertinent negatives include no vomiting.    Past Medical History:  Diagnosis Date  . Alcohol abuse   . Seizure [facility])     Patient Active Problem List   Diagnosis Date Noted  . Tobacco dependence 12/31/2015  . Seizure disorder (HCC) 12/31/2015  . Convulsion (HCC)   . Alcohol withdrawal seizure (HCC) 03/22/2015  . GAD (generalized anxiety disorder) 01/13/2014  . Adjustment disorder with mixed anxiety and depressed mood 01/13/2014  . Seizures (HCC) 01/12/2014  . Elevated liver enzymes 01/12/2014  . Alcohol dependence (HCC) 01/11/2014    History reviewed. No pertinent surgical history.     Home Medications    Prior to Admission medications   Medication Sig Start Date End Date Taking? Authorizing Provider  chlordiazePOXIDE (LIBRIUM) 25 MG capsule 25 mg PO TID x 1D, then 25  PO BID X 1D, then 25 g PO QD X 1D Patient not taking: Reported on 10/03/2016 01/12/16   Cathren Laine, MD  cyclobenzaprine (FLEXERIL) 10 MG tablet Take 1 tablet (10 mg total) by mouth 2 (two) times daily as needed for muscle spasms. 10/27/16   Arthor Captain, PA-C  gabapentin (NEURONTIN) 100 MG capsule Take 1 capsule (100 mg total) by mouth 3 (three) times daily. Patient not taking: Reported on 10/03/2016 01/01/16   Massie Maroon, FNP  levETIRAcetam (KEPPRA) 500 MG tablet Take 1 tablet (500 mg total) by mouth 2 (two) times daily. 10/27/16   Quentin Angst, MD    meloxicam (MOBIC) 15 MG tablet Take 1 tablet (15 mg total) by mouth daily. Take 1 daily with food. 10/27/16   Arthor Captain, PA-C  multivitamin (ONE-A-DAY MEN'S) TABS tablet Take 1 tablet by mouth daily. 03/25/15   Shanker Levora Dredge, MD  thiamine (VITAMIN B-1) 100 MG tablet Take 1 tablet (100 mg total) by mouth daily. 03/25/15   Shanker Levora Dredge, MD    Family History Family History  Problem Relation Age of Onset  . Diabetes Mother   . Hypertension Mother     Social History Social History  Substance Use Topics  . Smoking status: Current Every Day Smoker    Packs/day: 0.25    Years: 4.00    Types: Cigarettes  . Smokeless tobacco: Former Neurosurgeon  . Alcohol use No     Comment: every once in a while      Allergies   Ibuprofen   Review of Systems Review of Systems  Constitutional: Negative for chills and fever.  Respiratory: Negative.   Cardiovascular: Negative.   Gastrointestinal: Positive for nausea. Negative for abdominal pain and vomiting.  Genitourinary: Negative.   Musculoskeletal: Negative.   Skin: Negative.   Neurological: Positive for weakness.     Physical Exam Updated Vital Signs BP 131/76   Pulse 81   Ht  (1.803 m)   Wt 77.1 kg   SpO2 99%   BMI 23.71 kg/m   Physical Exam  Constitutional:  He is oriented to person, place, and time. He appears well-developed and well-nourished.  HENT:  Head: Normocephalic.  Neck: Normal range of motion. Neck supple.  Cardiovascular: Normal rate and regular rhythm.   Pulmonary/Chest: Effort normal and breath sounds normal.  Abdominal: Soft. Bowel sounds are normal. There is no tenderness. There is no rebound and no guarding.  Musculoskeletal: Normal range of motion.  Neurological: He is alert and oriented to person, place, and time.  Skin: Skin is warm and dry. No rash noted.  Psychiatric: He has a normal mood and affect.     ED Treatments / Results  Labs (all labs ordered are listed, but only abnormal results  are displayed) Labs Reviewed - No data to display  EKG  EKG Interpretation None       Radiology No results found.  Procedures Procedures (including critical care time)  Medications Ordered in ED Medications  ondansetron (ZOFRAN-ODT) disintegrating tablet 8 mg (8 mg Oral Given 02/02/17 0442)     Initial Impression / Assessment and Plan / ED Course  I have reviewed the triage vital signs and the nursing notes.  Pertinent labs & imaging results that were available during my care of the patient were reviewed by me and considered in my medical decision making (see chart for details).     Patient with complaint of nausea. No vomiting. He has been given Zofran and has been tolerating PO fluids since. He is well appearing and comfortable. He can be discharged home with Rx Zofran.  Final Clinical Impressions(s) / ED Diagnoses   Final diagnoses:  None   1. Nausea, resolved 2. Generalized weakness  New Prescriptions New Prescriptions   No medications on file     Danne Harbor 02/02/17 1610    April Palumbo, MD 02/02/17 [phone]

## 2017-02-02 NOTE — ED Triage Notes (Signed)
States I started feeling bad just before the storm.  Reports nausea but no vomiting.  States "I think i'm dehydrated"

## 2017-03-09 MED FILL — levETIRAcetam 500 MG TABS: 500 | 30 days supply | Qty: 60 | Fill #2

## 2017-04-03 ENCOUNTER — Ambulatory Visit: Payer: Self-pay | Admitting: Family Medicine

## 2017-04-06 ENCOUNTER — Other Ambulatory Visit: Payer: Self-pay | Admitting: Family Medicine

## 2017-04-06 DIAGNOSIS — G8929 Other chronic pain: Secondary | ICD-10-CM

## 2017-04-06 DIAGNOSIS — M549 Dorsalgia, unspecified: Principal | ICD-10-CM

## 2017-04-06 MED FILL — levETIRAcetam 500 MG TABS: 500 | 30 days supply | Qty: 60 | Fill #3

## 2017-05-12 MED FILL — levETIRAcetam 500 MG TABS: 500 | 30 days supply | Qty: 60 | Fill #4

## 2017-06-05 MED FILL — levETIRAcetam 500 MG TABS: 500 | 30 days supply | Qty: 60 | Fill #5

## 2017-06-16 ENCOUNTER — Encounter (HOSPITAL_COMMUNITY): Payer: Self-pay | Admitting: Emergency Medicine

## 2017-06-16 ENCOUNTER — Emergency Department (HOSPITAL_COMMUNITY)
Admission: EM | Admit: 2017-06-16 | Discharge: 2017-06-17 | Disposition: A | Discharge status: Home / Self Care | Payer: BLUE CROSS/BLUE SHIELD | Attending: Emergency Medicine | Admitting: Emergency Medicine

## 2017-06-16 DIAGNOSIS — F17210 Nicotine dependence, cigarettes, uncomplicated: Secondary | ICD-10-CM | POA: Diagnosis not present

## 2017-06-16 DIAGNOSIS — M25511 Pain in right shoulder: Secondary | ICD-10-CM

## 2017-06-16 DIAGNOSIS — W19XXXA Unspecified fall, initial encounter: Secondary | ICD-10-CM

## 2017-06-16 DIAGNOSIS — Z79899 Other long term (current) drug therapy: Secondary | ICD-10-CM | POA: Diagnosis not present

## 2017-06-16 DIAGNOSIS — Y9389 Activity, other specified: Secondary | ICD-10-CM | POA: Diagnosis not present

## 2017-06-16 DIAGNOSIS — W11XXXA Fall on and from ladder, initial encounter: Secondary | ICD-10-CM | POA: Diagnosis not present

## 2017-06-16 DIAGNOSIS — S40011A Contusion of right shoulder, initial encounter: Secondary | ICD-10-CM

## 2017-06-16 DIAGNOSIS — F1010 Alcohol abuse, uncomplicated: Secondary | ICD-10-CM | POA: Insufficient documentation

## 2017-06-16 DIAGNOSIS — S4991XA Unspecified injury of right shoulder and upper arm, initial encounter: Secondary | ICD-10-CM | POA: Diagnosis present

## 2017-06-16 DIAGNOSIS — Y929 Unspecified place or not applicable: Secondary | ICD-10-CM | POA: Diagnosis not present

## 2017-06-16 DIAGNOSIS — Y998 Other external cause status: Secondary | ICD-10-CM | POA: Diagnosis not present

## 2017-06-16 MED ORDER — HYDROCODONE-ACETAMINOPHEN 5-325 MG PO TABS
1.0000 | ORAL_TABLET | Freq: Once | ORAL | Status: AC
  Administered 2017-06-17: 1 via ORAL
  Filled 2017-06-16: qty 1

## 2017-06-16 NOTE — ED Triage Notes (Signed)
Pt sts fell approx 8 feet when ladder slipped; pt sts right shoulder and back pain

## 2017-06-17 ENCOUNTER — Emergency Department (HOSPITAL_COMMUNITY): Payer: BLUE CROSS/BLUE SHIELD

## 2017-06-17 MED ORDER — NAPROXEN 500 MG PO TABS: 500.0000 mg | ORAL_TABLET | Freq: Two times a day (BID) | ORAL | 0 refills | Status: DC

## 2017-06-17 NOTE — Discharge Instructions (Signed)
He was seen today after a fall off a ladder. Your x-rays are negative. He likely sustained a bruise to your left shoulder.  Use naproxen as needed for pain. Ice the extremity and make sure to use early range of motion exercises.

## 2017-06-17 NOTE — ED Provider Notes (Signed)
[facility] DEPT Provider Note   CSN: [id number] Arrival date & time: 06/16/17  1759     History   Chief Complaint Chief Complaint  Patient presents with  . Fall    HPI BARTLETT Apfel is a 41 y.o. male.  HPI  This is a 41 year old male with history of alcohol abuse and seizures who presents following falling off a ladder. Patient reports that he was coming down off a roof when he felt weak. He felt dehydrated and his leg slipped. The ladder fell to the side approximately 8 feet and the patient landed on his right shoulder and right back.  He denies hitting his head or loss of consciousness. He denies any shortness of breath or chest pain. Tetanus is up-to-date.  Past Medical History:  Diagnosis Date  . Alcohol abuse   . Seizure [facility])     Patient Active Problem List   Diagnosis Date Noted  . Tobacco dependence 12/31/2015  . Seizure disorder (HCC) 12/31/2015  . Convulsion (HCC)   . Alcohol withdrawal seizure (HCC) 03/22/2015  . GAD (generalized anxiety disorder) 01/13/2014  . Adjustment disorder with mixed anxiety and depressed mood 01/13/2014  . Seizures (HCC) 01/12/2014  . Elevated liver enzymes 01/12/2014  . Alcohol dependence (HCC) 01/11/2014    History reviewed. No pertinent surgical history.     Home Medications    Prior to Admission medications   Medication Sig Start Date End Date Taking? Authorizing Provider  levETIRAcetam (KEPPRA) 500 MG tablet Take 1 tablet (500 mg total) by mouth 2 (two) times daily. 10/27/16  Yes Quentin Angst, MD  chlordiazePOXIDE (LIBRIUM) 25 MG capsule 25 mg PO TID x 1D, then 25  PO BID X 1D, then 25 g PO QD X 1D Patient not taking: Reported on 10/03/2016 01/12/16   Cathren Laine, MD  cyclobenzaprine (FLEXERIL) 10 MG tablet Take 1 tablet (10 mg total) by mouth 2 (two) times daily as needed for muscle spasms. Patient not taking: Reported on 06/16/2017 10/27/16   Arthor Captain, PA-C  gabapentin (NEURONTIN) 100 MG capsule Take 1  capsule (100 mg total) by mouth 3 (three) times daily. Patient not taking: Reported on 10/03/2016 01/01/16   Massie Maroon, FNP  ibuprofen (ADVIL,MOTRIN) 600 MG tablet TAKE 1 TABLET BY MOUTH EVERY 8 HOURS AS NEEDED. Patient not taking: Reported on 06/16/2017 04/06/17   Massie Maroon, FNP  meloxicam (MOBIC) 15 MG tablet Take 1 tablet (15 mg total) by mouth daily. Take 1 daily with food. Patient not taking: Reported on 06/16/2017 10/27/16   Arthor Captain, PA-C  multivitamin (ONE-A-DAY MEN'S) TABS tablet Take 1 tablet by mouth daily. Patient not taking: Reported on 06/16/2017 03/25/15   Maretta Bees, MD  naproxen (NAPROSYN) 500 MG tablet Take 1 tablet (500 mg total) by mouth 2 (two) times daily. 06/17/17   Dameshia Seybold, Mayer Masker, MD  ondansetron (ZOFRAN ODT) 4 MG disintegrating tablet Take 1 tablet (4 mg total) by mouth every 8 (eight) hours as needed for nausea or vomiting. Patient not taking: Reported on 06/16/2017 02/02/17   Elpidio Anis, PA-C  thiamine (VITAMIN B-1) 100 MG tablet Take 1 tablet (100 mg total) by mouth daily. Patient not taking: Reported on 06/16/2017 03/25/15   Maretta Bees, MD    Family History Family History  Problem Relation Age of Onset  . Diabetes Mother   . Hypertension Mother     Social History Social History  Substance Use Topics  . Smoking status: Current Every Day Smoker  Packs/day: 0.25    Years: 4.00    Types: Cigarettes  . Smokeless tobacco: Former NeurosurgeonUser  . Alcohol use No     Comment: every once in a while      Allergies   Ibuprofen   Review of Systems Review of Systems  Constitutional: Negative for fever.  Respiratory: Negative for shortness of breath.   Cardiovascular: Negative for chest pain.  Musculoskeletal: Positive for back pain.       Right shoulder pain  Neurological: Negative for seizures, syncope, weakness and headaches.  All other systems reviewed and are negative.    Physical Exam Updated Vital Signs BP (!) 157/85  (BP Location: Right Arm)   Pulse (!) 106   Temp 98.5 F (36.9 C) (Oral)   Resp 16   Ht 5\' 11"  (1.803 m)   Wt 77.1 kg (170 lb)   SpO2 100%   BMI 23.71 kg/m   Physical Exam  Constitutional: He is oriented to person, place, and time. He appears well-developed and well-nourished.  ABCs intact  HENT:  Head: Normocephalic and atraumatic.  Neck: Neck supple.  No midline C-spine tenderness  Cardiovascular: Normal rate, regular rhythm and normal heart sounds.   No murmur heard. Pulmonary/Chest: Effort normal and breath sounds normal. No respiratory distress. He has no wheezes. He exhibits tenderness.  Right posterior chest wall tenderness to palpation, no crepitus  Abdominal: Soft. Bowel sounds are normal. There is no tenderness. There is no rebound.  Musculoskeletal: He exhibits no edema.  Tenderness to palpation right AC oint, no obvious deformity, normal range of motion of the right shoulder, 2+ radial pulse Small abrasion right shoulder  Neurological: He is alert and oriented to person, place, and time.  Skin: Skin is warm and dry.  Psychiatric: He has a normal mood and affect.  Nursing note and vitals reviewed.    ED Treatments / Results  Labs (all labs ordered are listed, but only abnormal results are displayed) Labs Reviewed - No data to display  EKG  EKG Interpretation None       Radiology Dg Chest 2 View  Result Date: 06/17/2017 CLINICAL DATA:  Fall with back pain EXAM: CHEST  2 VIEW COMPARISON:  12/15/2013 FINDINGS: The heart size and mediastinal contours are within normal limits. Both lungs are clear. The visualized skeletal structures are unremarkable. IMPRESSION: No active cardiopulmonary disease. Electronically Signed   By: Jasmine PangKim  Fujinaga M.D.   On: 06/17/2017 01:06   Dg Shoulder Right  Result Date: 06/17/2017 CLINICAL DATA:  Fall with right shoulder pain EXAM: RIGHT SHOULDER - 2+ VIEW COMPARISON:  None. FINDINGS: There is no evidence of fracture or  dislocation. There is no evidence of arthropathy or other focal bone abnormality. Soft tissues are unremarkable. IMPRESSION: Negative. Electronically Signed   By: Jasmine PangKim  Fujinaga M.D.   On: 06/17/2017 01:05    Procedures Procedures (including critical care time)  Medications Ordered in ED Medications  HYDROcodone-acetaminophen (NORCO/VICODIN) 5-325 MG per tablet 1 tablet (1 tablet Oral Given 06/17/17 0027)     Initial Impression / Assessment and Plan / ED Course  I have reviewed the triage vital signs and the nursing notes.  Pertinent labs & imaging results that were available during my care of the patient were reviewed by me and considered in my medical decision making (see chart for details).     Patient presents with right shoulder and right posterior chest pain after falling off a ladder onto his right side. He denies hitting his head  or loss of consciousness. He is otherwise nontoxic-appearing. ABCs are intact.only signs of trauma a small abrasion of the right shoulder. X-rays obtained and negative. Patient given Norco. Will discharge with naproxen as needed for pain. Encouraged ice and range of motion exercises.  After history, exam, and medical workup I feel the patient has been appropriately medically screened and is safe for discharge home. Pertinent diagnoses were discussed with the patient. Patient was given return precautions.   Final Clinical Impressions(s) / ED Diagnoses   Final diagnoses:  Fall, initial encounter  Acute pain of right shoulder  Contusion of right shoulder, initial encounter    New Prescriptions New Prescriptions   NAPROXEN (NAPROSYN) 500 MG TABLET    Take 1 tablet (500 mg total) by mouth 2 (two) times daily.     Shon Baton, MD 06/17/17 [phone]

## 2017-06-18 ENCOUNTER — Emergency Department (HOSPITAL_COMMUNITY)
Admission: EM | Admit: 2017-06-18 | Discharge: 2017-06-18 | Disposition: A | Discharge status: Home / Self Care | Payer: BLUE CROSS/BLUE SHIELD | Attending: Emergency Medicine | Admitting: Emergency Medicine

## 2017-06-18 DIAGNOSIS — F17210 Nicotine dependence, cigarettes, uncomplicated: Secondary | ICD-10-CM | POA: Insufficient documentation

## 2017-06-18 DIAGNOSIS — R569 Unspecified convulsions: Secondary | ICD-10-CM

## 2017-06-18 LAB — BASIC METABOLIC PANEL
Anion gap: 11 (ref 5–15)
BUN: 13 mg/dL (ref 6–20)
CHLORIDE: 92 mmol/L — AB (ref 101–111)
CO2: 26 mmol/L (ref 22–32)
CREATININE: 0.98 mg/dL (ref 0.61–1.24)
Calcium: 9 mg/dL (ref 8.9–10.3)
GFR calc non Af Amer: >60 mL/min (ref >60)
Glucose, Bld: 110 mg/dL — ABNORMAL HIGH (ref 65–99)
Potassium: 3.6 mmol/L (ref 3.5–5.1)
Sodium: 129 mmol/L — ABNORMAL LOW (ref 135–145)

## 2017-06-18 LAB — CBC
HEMATOCRIT: 35.6 % — AB (ref 39.0–52.0)
HEMOGLOBIN: 13 g/dL (ref 13.0–17.0)
MCH: 32.8 pg (ref 26.0–34.0)
MCHC: 36.5 g/dL — AB (ref 30.0–36.0)
MCV: 89.9 fL (ref 78.0–100.0)
Platelets: 158 10*3/uL (ref 150–400)
RBC: 3.96 MIL/uL — ABNORMAL LOW (ref 4.22–5.81)
RDW: 13.7 % (ref 11.5–15.5)
WBC: 6.8 10*3/uL (ref 4.0–10.5)

## 2017-06-18 LAB — CBG MONITORING, ED: Glucose-Capillary: 109 mg/dL — ABNORMAL HIGH (ref 65–99)

## 2017-06-18 MED ORDER — SODIUM CHLORIDE 0.9 % IV BOLUS (SEPSIS)
1000.0000 mL | Freq: Once | INTRAVENOUS | Status: AC
  Administered 2017-06-18: 1000 mL via INTRAVENOUS

## 2017-06-18 MED ORDER — SODIUM CHLORIDE 0.9 % IV SOLN
1000.0000 mg | Freq: Once | INTRAVENOUS | Status: AC
  Administered 2017-06-18: 1000 mg via INTRAVENOUS
  Filled 2017-06-18: qty 10

## 2017-06-18 MED ORDER — LORAZEPAM 2 MG/ML IJ SOLN
1.0000 mg | Freq: Once | INTRAMUSCULAR | Status: AC
  Administered 2017-06-18: 1 mg via INTRAVENOUS
  Filled 2017-06-18: qty 1

## 2017-06-18 MED ORDER — LEVETIRACETAM 750 MG PO TABS: 750.0000 mg | ORAL_TABLET | Freq: Two times a day (BID) | ORAL | 0 refills | Status: DC

## 2017-06-18 NOTE — ED Triage Notes (Signed)
[place] [facility] [place] [facility] [place] [facility] Pt was sitting at a computer filling a job application when he began to have a seizure. Pt friend said pt did fall from the chair and the seizure lasted approx. 30 seconds. EMS reports that on arrival pt was ambulating and confused when arriving. Pt is denying all pain and has no oral trauma or urinary incontinence. Pt has a hx of seizures and takes medication.

## 2017-06-18 NOTE — Discharge Instructions (Signed)
We are going to increase your Keppra dosing.  Instead of the 500 mg tablets that you currently have, you should start taking ONE 750 MG TABLET twice a day. I have prescribed these NEW tablets for you today. Do not take the 500 mg tablets once you have filled the new, higher dose of 750 mg.

## 2017-06-18 NOTE — ED Notes (Signed)
Pt ambulated w/o assist over 30 feet.  Pt maintained a steady gait. Pt's pulse rate and O2 remained within normal limits.  Pt's RR increased to 33 and dropped to normal limits when he arrived back in room.

## 2017-06-18 NOTE — ED Provider Notes (Signed)
[facility]-EMERGENCY DEPT Provider Note   CSN: [id number] Arrival date & time: 06/18/17  1640     History   Chief Complaint Chief Complaint  Patient presents with  . Seizures    HPI Dwayne Bolton is a 41 y.o. male.  HPI  41 yo M with PMHx seizure disorder, alcohol dependence here with seizure. Pt was reportedly out in the heat for the past 48 hours. He is currently homeless and felt "exhausted" in the heat all day yesterday. This [facility]. He tried to sit down and felt extremely overheated. He then remembers nothing until EMS arrival. According to report, pt had witnessed [facility] seizure. He did bite his left tongue. He has been taking his keppra as prescribed. No other complaints. His last drink was 1-2 days ago but he denies any tremors. He has known EtOH dependence but denies any sx of withdrawal. No current complaints. No headache. No known fevers.  Past Medical History:  Diagnosis Date  . Alcohol abuse   . Seizure [facility](HCC)     Patient Active Problem List   Diagnosis Date Noted  . Tobacco dependence 12/31/2015  . Seizure disorder (HCC) 12/31/2015  . Convulsion (HCC)   . Alcohol withdrawal seizure (HCC) 03/22/2015  . GAD (generalized anxiety disorder) 01/13/2014  . Adjustment disorder with mixed anxiety and depressed mood 01/13/2014  . Seizures (HCC) 01/12/2014  . Elevated liver enzymes 01/12/2014  . Alcohol dependence (HCC) 01/11/2014    No past surgical history on file.     Home Medications    Prior to Admission medications   Medication Sig Start Date End Date Taking? Authorizing Provider  chlordiazePOXIDE (LIBRIUM) 25 MG capsule 25 mg PO TID x 1D, then 25  PO BID X 1D, then 25 g PO QD X 1D Patient not taking: Reported on 10/03/2016 01/12/16   Cathren LaineSteinl, Kevin, MD  cyclobenzaprine (FLEXERIL) 10 MG tablet Take 1 tablet (10 mg total) by mouth 2 (two) times daily as needed for muscle spasms. Patient not taking: Reported on 06/16/2017  10/27/16   Arthor CaptainHarris, Abigail, PA-C  gabapentin (NEURONTIN) 100 MG capsule Take 1 capsule (100 mg total) by mouth 3 (three) times daily. Patient not taking: Reported on 10/03/2016 01/01/16   Massie MaroonHollis, Lachina M, FNP  ibuprofen (ADVIL,MOTRIN) 600 MG tablet TAKE 1 TABLET BY MOUTH EVERY 8 HOURS AS NEEDED. Patient not taking: Reported on 06/16/2017 04/06/17   Massie MaroonHollis, Lachina M, FNP  levETIRAcetam (KEPPRA) 750 MG tablet Take 1 tablet (750 mg total) by mouth 2 (two) times daily. 06/18/17 07/18/17  Shaune PollackIsaacs, Arwin Bisceglia, MD  meloxicam (MOBIC) 15 MG tablet Take 1 tablet (15 mg total) by mouth daily. Take 1 daily with food. Patient not taking: Reported on 06/16/2017 10/27/16   Arthor CaptainHarris, Abigail, PA-C  multivitamin (ONE-A-DAY MEN'S) TABS tablet Take 1 tablet by mouth daily. Patient not taking: Reported on 06/16/2017 03/25/15   Maretta BeesGhimire, Shanker M, MD  naproxen (NAPROSYN) 500 MG tablet Take 1 tablet (500 mg total) by mouth 2 (two) times daily. Patient not taking: Reported on 06/18/2017 06/17/17   Horton, Mayer Maskerourtney F, MD  ondansetron (ZOFRAN ODT) 4 MG disintegrating tablet Take 1 tablet (4 mg total) by mouth every 8 (eight) hours as needed for nausea or vomiting. Patient not taking: Reported on 06/16/2017 02/02/17   Elpidio AnisUpstill, Shari, PA-C  thiamine (VITAMIN B-1) 100 MG tablet Take 1 tablet (100 mg total) by mouth daily. Patient not taking: Reported on 06/16/2017 03/25/15   Maretta BeesGhimire, Shanker M,  MD    Family History Family History  Problem Relation Age of Onset  . Diabetes Mother   . Hypertension Mother     Social History Social History  Substance Use Topics  . Smoking status: Current Every Day Smoker    Packs/day: 0.25    Years: 4.00    Types: Cigarettes  . Smokeless tobacco: Former Neurosurgeon  . Alcohol use No     Comment: every once in a while      Allergies   Ibuprofen   Review of Systems Review of Systems  Constitutional: Positive for fatigue. Negative for chills and fever.  HENT: Negative for congestion and rhinorrhea.     Eyes: Negative for visual disturbance.  Respiratory: Negative for cough, shortness of breath and wheezing.   Cardiovascular: Negative for chest pain and leg swelling.  Gastrointestinal: Negative for abdominal pain, diarrhea, nausea and vomiting.  Genitourinary: Negative for dysuria and flank pain.  Musculoskeletal: Negative for neck pain and neck stiffness.  Skin: Negative for rash and wound.  Allergic/Immunologic: Negative for immunocompromised state.  Neurological: Positive for seizures. Negative for syncope, weakness and headaches.  All other systems reviewed and are negative.    Physical Exam Updated Vital Signs BP 122/74 (BP Location: Left Arm)   Pulse 74   Temp 98.2 F (36.8 C) (Oral)   Resp 17   Ht 5\' 11"  (1.803 m)   Wt 81.6 kg (180 lb)   SpO2 98%   BMI 25.10 kg/m   Physical Exam  Constitutional: He is oriented to person, place, and time. He appears well-developed and well-nourished. No distress.  HENT:  Head: Normocephalic and atraumatic.  Superficial bite mark to left lateral tongue, no hematoma or active bleeding. Markedly dry MM  Eyes: Conjunctivae are normal.  Neck: Neck supple.  Cardiovascular: Normal rate, regular rhythm and normal heart sounds.  Exam reveals no friction rub.   No murmur heard. Pulmonary/Chest: Effort normal and breath sounds normal. No respiratory distress. He has no wheezes. He has no rales.  Abdominal: He exhibits no distension.  Musculoskeletal: He exhibits no edema.  Neurological: He is alert and oriented to person, place, and time. He exhibits normal muscle tone.  Skin: Skin is warm. Capillary refill takes less than 2 seconds.  Psychiatric: He has a normal mood and affect.  Nursing note and vitals reviewed.   Neurological Exam:  Mental Status: Alert and oriented to person, place, and time. Attention and concentration normal. Speech clear. Recent memory is intact. Cranial Nerves: Visual fields grossly intact. EOMI and PERRLA. No  nystagmus noted. Facial sensation intact at forehead, maxillary cheek, and chin/mandible bilaterally. No facial asymmetry or weakness. Hearing grossly normal. Uvula is midline, and palate elevates symmetrically. Normal SCM and trapezius strength. Tongue midline without fasciculations. Motor: Muscle strength 5/5 in proximal and distal UE and LE bilaterally. No pronator drift. Muscle tone normal. Reflexes: 2+ and symmetrical in all four extremities.  Sensation: Intact to light touch in upper and lower extremities distally bilaterally.  Gait: Normal without ataxia. Coordination: Normal FTN bilaterally.     ED Treatments / Results  Labs (all labs ordered are listed, but only abnormal results are displayed) Labs Reviewed  CBC - Abnormal; Notable for the following:       Result Value   RBC 3.96 (*)    HCT 35.6 (*)    MCHC 36.5 (*)    All other components within normal limits  BASIC METABOLIC PANEL - Abnormal; Notable for the following:  Sodium 129 (*)    Chloride 92 (*)    Glucose, Bld 110 (*)    All other components within normal limits  CBG MONITORING, ED - Abnormal; Notable for the following:    Glucose-Capillary 109 (*)    All other components within normal limits    EKG  EKG Interpretation  Date/Time:  Thursday June 18 2017 17:16:28 EDT Ventricular Rate:  85 PR Interval:    QRS Duration: 89 QT Interval:  382 QTC Calculation: 455 R Axis:   61 Text Interpretation:  Sinus rhythm Anteroseptal infarct, old No significant change since last tracing Confirmed by Shaune Pollack [phone]) on 06/18/2017 7:39:28 PM       Radiology No results found.  Procedures Procedures (including critical care time)  Medications Ordered in ED Medications  sodium chloride 0.9 % bolus 1,000 mL (0 mLs Intravenous Stopped 06/18/17 2008)  sodium chloride 0.9 % bolus 1,000 mL (0 mLs Intravenous Stopped 06/18/17 1901)  levETIRAcetam (KEPPRA) 1,000 mg in sodium chloride 0.9 % 100 mL IVPB (0 mg  Intravenous Stopped 06/18/17 1841)  LORazepam (ATIVAN) injection 1 mg (1 mg Intravenous Given 06/18/17 1751)     Initial Impression / Assessment and Plan / ED Course  I have reviewed the triage vital signs and the nursing notes.  Pertinent labs & imaging results that were available during my care of the patient were reviewed by me and considered in my medical decision making (see chart for details).     41 yo M here with break through seizure after being outside in the heat all day. Suspect breakthrough seizure 2/2 dehydration, overheating. No signs of ongoing seizure activity. No recent trauma. His neurological exam is non-focal and reassuring. He has been given fluids her with improvement in sx, and remains HDS. He does have h/o etoh abuse but has no tremors, denies any sx of withdrawal and I do not feel EtOH w/d is contributing at this time. I discussed with Dr. Amada Jupiter of Neurolgy - will increase Keppra to 750, advise hydration as able, and d/c home. Pt ambulatory, back to baseline. Labs are at baseline.  Final Clinical Impressions(s) / ED Diagnoses   Final diagnoses:  Seizure [facility])    New Prescriptions Discharge Medication List as of 06/18/2017  8:54 PM       Shaune Pollack, MD 06/19/17 1146

## 2017-06-18 NOTE — ED Notes (Signed)
Bed: WA09 Expected date:  Expected time:  Means of arrival:  Comments: EMS-SZ 

## 2017-06-24 ENCOUNTER — Encounter: Payer: Self-pay | Admitting: Family Medicine

## 2017-06-24 ENCOUNTER — Ambulatory Visit (INDEPENDENT_AMBULATORY_CARE_PROVIDER_SITE_OTHER): Payer: BLUE CROSS/BLUE SHIELD | Admitting: Family Medicine

## 2017-06-24 VITALS — BP 128/76 | HR 79 | Temp 98.5°F | Resp 16 | Ht 70.0 in | Wt 191.0 lb

## 2017-06-24 DIAGNOSIS — M5442 Lumbago with sciatica, left side: Secondary | ICD-10-CM

## 2017-06-24 DIAGNOSIS — Z114 Encounter for screening for human immunodeficiency virus [HIV]: Secondary | ICD-10-CM

## 2017-06-24 DIAGNOSIS — Z23 Encounter for immunization: Secondary | ICD-10-CM | POA: Diagnosis not present

## 2017-06-24 DIAGNOSIS — R569 Unspecified convulsions: Secondary | ICD-10-CM | POA: Diagnosis not present

## 2017-06-24 DIAGNOSIS — G8929 Other chronic pain: Secondary | ICD-10-CM | POA: Diagnosis not present

## 2017-06-24 DIAGNOSIS — F17200 Nicotine dependence, unspecified, uncomplicated: Secondary | ICD-10-CM

## 2017-06-24 DIAGNOSIS — M5441 Lumbago with sciatica, right side: Secondary | ICD-10-CM

## 2017-06-24 DIAGNOSIS — E871 Hypo-osmolality and hyponatremia: Secondary | ICD-10-CM

## 2017-06-24 MED ORDER — LEVETIRACETAM 750 MG PO TABS: 750.0000 mg | ORAL_TABLET | Freq: Two times a day (BID) | ORAL | 5 refills | Status: DC

## 2017-06-24 MED ORDER — GABAPENTIN 100 MG PO CAPS: 100.0000 mg | ORAL_CAPSULE | Freq: Three times a day (TID) | ORAL | 1 refills | Status: DC

## 2017-06-24 NOTE — Patient Instructions (Signed)
I sent a referral to neurology.

## 2017-06-24 NOTE — Progress Notes (Signed)
Subjective:    Patient ID: Dwayne Bolton, male    DOB: [birth year], 41 y.o.   MRN: [id number]  HPI Mr. lorie, melichar 41 year old male presents for a follow up of seizure disorder. Patient was in his usual state of health prior to 06/16/2017. 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 He experienced a seizure at that time. Prior to seizure, symptoms had been controlled on Kepra BID. Dosage of Sheralyn Boatman was increased in the emergency department. He has not experienced seizure activity since that time.  He maintains that he initially started having seizures following a head injury in 2015. He does not have a history of illicit drug use, but was an every other day alcohol user. He denies a history of developmental delay and does not have a family history of seizures.  He states that he was evaluated by neurology in the past, but did not return for follow up.  He is a chronic everyday smoker. He maintains that he smokes 0.5 packs per day. He is not interested in smoking cessation at this time.   He is also complaining of chronic back pain. He states that back pain started several months ago. It is worsened by increased standing, bending, and pivoting movements. He denies recent back injury. He states that pain radiates to thighs bilaterally. He states that he has not used any OTC interventions to alleviate symptoms.  .  Past Medical History:  Diagnosis Date  . Alcohol abuse   . Seizure (HCC)   No past surgical history on file.  Social History   Social History  . Marital status: Single    Spouse name: N/A  . Number of children: N/A  . Years of education: N/A   Occupational History  . Not on file.   Social History Main Topics  . Smoking status: Current Every Day Smoker    Packs/day: 0.25    Years: 4.00    Types: Cigarettes  . Smokeless tobacco: Former Neurosurgeon  . Alcohol use No     Comment: every once in a while   . Drug use: No  .  Sexual activity: Not on file   Other Topics Concern  . Not on file   Social History Narrative  . No narrative on file   Immunization History  Administered Date(s) Administered  . Influenza-Unspecified 06/20/2016   Review of Systems  Constitutional: Negative.  Negative for unexpected weight change.  HENT: Negative.   Respiratory: Negative.  Negative for shortness of breath.   Cardiovascular: Negative.   Endocrine: Negative.  Negative for polydipsia, polyphagia and polyuria.  Genitourinary: Negative.  Negative for dysuria and flank pain.  Musculoskeletal: Positive for back pain (tail bone).  Skin: Negative.   Allergic/Immunologic: Negative.   Neurological: Positive for seizures. Negative for dizziness, speech difficulty, weakness, light-headedness, numbness and headaches.  Hematological: Negative.   Psychiatric/Behavioral: Negative for sleep disturbance and suicidal ideas.       Objective:   Physical Exam  Constitutional: He is oriented to person, place, and time.  HENT:  Head: Normocephalic and atraumatic.  Right Ear: External ear normal.  Left Ear: External ear normal.  Nose: Nose normal.  Mouth/Throat: Oropharynx is clear and moist.  Eyes: Pupils are equal, round, and reactive to light. Conjunctivae and EOM are normal.  Neck: Normal range of motion. Neck supple.  Pulmonary/Chest: Effort normal and breath sounds normal.  Abdominal: Soft. Bowel sounds are normal.  Musculoskeletal:  Lumbar back: He exhibits decreased range of motion and pain.  Neurological: He is alert and oriented to person, place, and time. He has normal reflexes.  Skin: Skin is warm and dry.  Psychiatric: He has a normal mood and affect. His behavior is normal. Judgment and thought content normal.     BP 128/76 (BP Location: Left Arm, Patient Position: Sitting, Cuff Size: Large)   Pulse 79   Temp 98.5 F (36.9 C) (Oral)   Resp 16   Ht 5\' 10"  (1.778 m)   Wt 191 lb (86.6 kg)   SpO2 100%    BMI 27.41 kg/m   Assessment & Plan  1. Seizures (HCC) Patient has a history of seizure disorder since 2015. He warrants a neurology evaluation for further workup and evaluation.  - Ambulatory referral to Neurology - levETIRAcetam (KEPPRA) 750 MG tablet; Take 1 tablet (750 mg total) by mouth 2 (two) times daily.  Dispense: 60 tablet; Refill: 5 - Basic Metabolic Panel  2. Chronic bilateral low back pain with bilateral sciatica - gabapentin (NEURONTIN) 100 MG capsule; Take 1 capsule (100 mg total) by mouth 3 (three) times daily.  Dispense: 90 capsule; Refill: 1  3. Need for Tdap vaccination - Tdap vaccine greater than or equal to 7yo IM  4. Tobacco dependence Smoking cessation instruction/counseling given:  counseled patient on the dangers of tobacco use, advised patient to stop smoking, and reviewed strategies to maximize success  5. Screening for HIV (human immunodeficiency virus) - HIV antibody (with reflex)   RTC: 6 months for complete physical exam   Nolon NationsLaChina Moore Natane Heward  MSN, FNP-C [facility] [address]  [place], [place] [id number] [phone]

## 2017-06-26 LAB — BASIC METABOLIC PANEL WITH GFR
BUN: 11 mg/dL (ref 7–25)
CALCIUM: 9.6 mg/dL (ref 8.6–10.3)
CHLORIDE: 103 mmol/L (ref 98–110)
CO2: 26 mmol/L (ref 20–32)
CREATININE: 1.04 mg/dL (ref 0.60–1.35)
GFR, Est African American: 105 mL/min/{1.73_m2} (ref >=60)
GFR, Est Non African American: 91 mL/min/{1.73_m2} (ref >=60)
GLUCOSE: 72 mg/dL (ref 65–99)
Potassium: 3.8 mmol/L (ref 3.5–5.3)
Sodium: 138 mmol/L (ref 135–146)

## 2017-06-26 LAB — HIV ANTIBODY (ROUTINE TESTING W REFLEX): HIV 1&2 Ab, 4th Generation: NONREACTIVE

## 2017-06-26 LAB — EXTRA LAV TOP TUBE

## 2017-07-02 ENCOUNTER — Telehealth: Payer: Self-pay | Admitting: *Deleted

## 2017-07-02 ENCOUNTER — Ambulatory Visit: Payer: Self-pay | Admitting: Neurology
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
## 2017-07-12 ENCOUNTER — Emergency Department (HOSPITAL_COMMUNITY): Payer: BLUE CROSS/BLUE SHIELD

## 2017-07-12 ENCOUNTER — Encounter (HOSPITAL_COMMUNITY): Payer: Self-pay

## 2017-07-12 ENCOUNTER — Emergency Department (HOSPITAL_COMMUNITY)
Admission: EM | Admit: 2017-07-12 | Discharge: 2017-07-12 | Disposition: A | Discharge status: Home / Self Care | Payer: BLUE CROSS/BLUE SHIELD | Attending: Emergency Medicine | Admitting: Emergency Medicine

## 2017-07-12 DIAGNOSIS — X500XXA Overexertion from strenuous movement or load, initial encounter: Secondary | ICD-10-CM | POA: Insufficient documentation

## 2017-07-12 DIAGNOSIS — Y9389 Activity, other specified: Secondary | ICD-10-CM | POA: Insufficient documentation

## 2017-07-12 DIAGNOSIS — S62339A Displaced fracture of neck of unspecified metacarpal bone, initial encounter for closed fracture: Secondary | ICD-10-CM | POA: Diagnosis not present

## 2017-07-12 DIAGNOSIS — Q7190 Unspecified reduction defect of unspecified upper limb: Secondary | ICD-10-CM

## 2017-07-12 DIAGNOSIS — Y929 Unspecified place or not applicable: Secondary | ICD-10-CM | POA: Insufficient documentation

## 2017-07-12 DIAGNOSIS — Y999 Unspecified external cause status: Secondary | ICD-10-CM | POA: Diagnosis not present

## 2017-07-12 DIAGNOSIS — F17210 Nicotine dependence, cigarettes, uncomplicated: Secondary | ICD-10-CM | POA: Insufficient documentation

## 2017-07-12 DIAGNOSIS — S6991XA Unspecified injury of right wrist, hand and finger(s), initial encounter: Secondary | ICD-10-CM | POA: Diagnosis present

## 2017-07-12 MED ORDER — LIDOCAINE-EPINEPHRINE (PF) 2 %-1:200000 IJ SOLN
20.0000 mL | Freq: Once | INTRAMUSCULAR | Status: AC
  Administered 2017-07-12: 20 mL via INTRADERMAL
  Filled 2017-07-12: qty 20

## 2017-07-12 NOTE — ED Notes (Signed)
Pt asking how long the wait 

## 2017-07-12 NOTE — ED Provider Notes (Signed)
[facility]-EMERGENCY DEPT Provider Note   CSN: [id number] Arrival date & time: 07/12/17  1357     History   Chief Complaint Chief Complaint  Patient presents with  . Hand Injury    HPI Dwayne Bolton is a 41 y.o. male.  41 yo M with a chief complaint of right hand pain. Patient was hitting a heavy bag at the gym. He headed and felt a pop to his right hand. Had significant pain along the ulnar aspect of the hand. Significant swelling as well. Pain worse with movement palpation. Denies other injury. Denies open wound. Denies fight bite.   The history is provided by the patient.  Hand Injury   The incident occurred 6 to 12 hours ago. The incident occurred at the gym. The injury mechanism was a direct blow. The pain is present in the right hand. The quality of the pain is described as aching, burning and sharp. The pain is at a severity of 9/10. The pain is severe. The pain has been constant since the incident. Pertinent negatives include no fever. He reports no foreign bodies present. The symptoms are aggravated by movement. He has tried nothing for the symptoms. The treatment provided no relief.    Past Medical History:  Diagnosis Date  . Alcohol abuse   . Seizure [facility])     Patient Active Problem List   Diagnosis Date Noted  . Tobacco dependence 12/31/2015  . Seizure disorder (HCC) 12/31/2015  . Convulsion (HCC)   . Alcohol withdrawal seizure (HCC) 03/22/2015  . GAD (generalized anxiety disorder) 01/13/2014  . Adjustment disorder with mixed anxiety and depressed mood 01/13/2014  . Seizures (HCC) 01/12/2014  . Elevated liver enzymes 01/12/2014  . Alcohol dependence (HCC) 01/11/2014    History reviewed. No pertinent surgical history.     Home Medications    Prior to Admission medications   Medication Sig Start Date End Date Taking? Authorizing Provider  gabapentin (NEURONTIN) 100 MG capsule Take 1 capsule (100 mg total) by mouth 3 (three) times daily. 06/24/17   Massie Maroon, FNP  levETIRAcetam (KEPPRA) 750 MG tablet Take 1 tablet (750 mg total) by mouth 2 (two) times daily. 06/24/17 07/24/17  Massie Maroon, FNP    Family History Family History  Problem Relation Age of Onset  . Diabetes Mother   . Hypertension Mother     Social History Social History  Substance Use Topics  . Smoking status: Current Every Day Smoker    Packs/day: 0.25    Years: 4.00    Types: Cigarettes  . Smokeless tobacco: Former Neurosurgeon  . Alcohol use No     Comment: every once in a while      Allergies   Ibuprofen   Review of Systems Review of Systems  Constitutional: Negative for chills and fever.  HENT: Negative for congestion and facial swelling.   Eyes: Negative for discharge and visual disturbance.  Respiratory: Negative for shortness of breath.   Cardiovascular: Negative for chest pain and palpitations.  Gastrointestinal: Negative for abdominal pain, diarrhea and vomiting.  Musculoskeletal: Positive for arthralgias and myalgias.  Skin: Negative for color change and rash.  Neurological: Negative for tremors, syncope and headaches.  Psychiatric/Behavioral: Negative for confusion and dysphoric mood.     Physical Exam Updated Vital Signs BP (!) 142/96 (BP Location: Right Arm)   Pulse (!) 125   Temp 98.8 F (37.1 C) (Oral)   Resp 16   SpO2 99%   Physical Exam  Constitutional: He  is oriented to person, place, and time. He appears well-developed and well-nourished.  HENT:  Head: Normocephalic and atraumatic.  Eyes: Pupils are equal, round, and reactive to light. EOM are normal.  Neck: Normal range of motion. Neck supple. No JVD present.  Cardiovascular: Normal rate and regular rhythm.  Exam reveals no gallop and no friction rub.   No murmur heard. Pulmonary/Chest: No respiratory distress. He has no wheezes.  Abdominal: He exhibits no distension. There is no tenderness. There is no guarding.  Musculoskeletal: Normal range of motion. He exhibits edema  and tenderness.  Significant swelling to the right hand about the fourth and fifth digit.  Intact cap refil to the fourth and fifth digit.  No noted open wound.  No rotational component on making a first.   Neurological: He is alert and oriented to person, place, and time.  Skin: No rash noted. No pallor.  Psychiatric: He has a normal mood and affect. His behavior is normal.  Nursing note and vitals reviewed.    ED Treatments / Results  Labs (all labs ordered are listed, but only abnormal results are displayed) Labs Reviewed - No data to display  EKG  EKG Interpretation None       Radiology Dg Hand 2 View Right  Result Date: 07/12/2017 CLINICAL DATA:  Postreduction. EXAM: RIGHT HAND - 2 VIEW COMPARISON:  RIGHT hand radiographs July 12, 2017 at 1413 hours FINDINGS: Re- demonstration distal fifth metacarpal fracture with similar palmar angulation distal bony fragments. No intra-articular extension. No dislocation. Interval placement of fiberglass splint. IMPRESSION: Similarly angulated acute distal fifth metacarpal fracture in fiberglass splint. Electronically Signed   By: Awilda Metro M.D.   On: 07/12/2017 18:21   Dg Hand Complete Right  Result Date: 07/12/2017 CLINICAL DATA:  Punching bag injury EXAM: RIGHT HAND - COMPLETE 3+ VIEW COMPARISON:  None. FINDINGS: Fracture distal fifth metacarpal with moderate angulation. No other fracture or arthropathy IMPRESSION: Angulated fracture distal fifth metacarpal Electronically Signed   By: Marlan Palau M.D.   On: 07/12/2017 14:43    Procedures Reduction of fracture Date/Time: 07/12/2017 6:25 PM Performed by: Adela Lank Kaetlyn Noa Authorized by: Melene Plan  Consent: Verbal consent obtained. Risks and benefits: risks, benefits and alternatives were discussed Consent given by: patient Patient understanding: patient states understanding of the procedure being performed Imaging studies: imaging studies available Patient identity confirmed:  verbally with patient Time out: Immediately prior to procedure a "time out" was called to verify the correct patient, procedure, equipment, support staff and site/side marked as required. Preparation: Patient was prepped and draped in the usual sterile fashion. Local anesthesia used: yes Anesthesia: hematoma block  Anesthesia: Local anesthesia used: yes Local Anesthetic: lidocaine 1% with epinephrine Anesthetic total: 4 mL  Sedation: Patient sedated: no Patient tolerance: Patient tolerated the procedure well with no immediate complications  .Nerve Block Date/Time: 07/12/2017 6:26 PM Performed by: Melene Plan Authorized by: Melene Plan   Consent:    Consent obtained:  Verbal   Consent given by:  Patient   Risks discussed:  Allergic reaction, infection, bleeding, intravenous injection, nerve damage and pain   Alternatives discussed:  No treatment Indications:    Indications:  Procedural anesthesia and pain relief Location:    Body area:  Upper extremity   Upper extremity nerve blocked: hematoma block.   Laterality:  Right Pre-procedure details:    Skin preparation:  2% chlorhexidine Skin anesthesia (see MAR for exact dosages):    Skin anesthesia method:  None Procedure details (see  MAR for exact dosages):    Block needle gauge:  27 G   Anesthetic injected:  Lidocaine 1% WITH epi   Steroid injected:  None   Additive injected:  None   Injection procedure:  Anatomic landmarks identified and anatomic landmarks palpated   Paresthesia:  None Post-procedure details:    Dressing:  Sterile dressing   Outcome:  Anesthesia achieved   Patient tolerance of procedure:  Tolerated well, no immediate complications   (including critical care time)  Medications Ordered in ED Medications  lidocaine-EPINEPHrine (XYLOCAINE W/EPI) 2 %-1:200000 (PF) injection 20 mL (20 mLs Intradermal Given by Other 07/12/17 1820)     Initial Impression / Assessment and Plan / ED Course  I have reviewed the  triage vital signs and the nursing notes.  Pertinent labs & imaging results that were available during my care of the patient were reviewed by me and considered in my medical decision making (see chart for details).     41 yo M With a chief complaint of a fifth metacarpal neck fracture. There is about 50 of angulation on my measurement. This was reduced at bedside with mild improvement. Given follow-up to hand surgery.  6:29 PM:  I have discussed the diagnosis/risks/treatment options with the patient and believe the pt to be eligible for discharge home to follow-up with Hand. We also discussed returning to the ED immediately if new or worsening sx occur. We discussed the sx which are most concerning (e.g., sudden worsening pain, fever, inability to tolerate by mouth) that necessitate immediate return. Medications administered to the patient during their visit and any new prescriptions provided to the patient are listed below.  Medications given during this visit Medications  lidocaine-EPINEPHrine (XYLOCAINE W/EPI) 2 %-1:200000 (PF) injection 20 mL (20 mLs Intradermal Given by Other 07/12/17 1820)     The patient appears reasonably screen and/or stabilized for discharge and I doubt any other medical condition or other [facility] requiring further screening, evaluation, or treatment in the ED at this time prior to discharge.    Final Clinical Impressions(s) / ED Diagnoses   Final diagnoses:  Closed boxer's fracture, initial encounter    New Prescriptions New Prescriptions   No medications on file     Melene Plan, DO 07/12/17 1829

## 2017-07-12 NOTE — Progress Notes (Signed)
Orthopedic Tech Progress Note Patient Details:  Dwayne Bolton 1976-01-04 [id number]  Ortho Devices Type of Ortho Device: Ace wrap, Ulna gutter splint, Arm sling Ortho Device/Splint Interventions: Application   Saul Fordyce 07/12/2017, 5:40 PM

## 2017-07-12 NOTE — ED Notes (Signed)
Pt stable, ambulatory, and verbalizes understanding of d/c instructions.  

## 2017-07-12 NOTE — ED Triage Notes (Signed)
Pt presents for evaluation of R hand pain after punching a punching bag. Reports heard a pop and then felt pain to R hand. Pt tachycardic in triage, reports ETOH intake. Pt ambulatory.

## 2017-07-12 NOTE — ED Notes (Signed)
Patient transported to X-ray 

## 2017-07-12 NOTE — Discharge Instructions (Signed)
Take 4 over the counter ibuprofen tablets 3 times a day or 2 over-the-counter naproxen tablets twice a day for pain. Also take tylenol 1000mg(2 extra strength) four times a day.    

## 2017-07-16 ENCOUNTER — Ambulatory Visit: Payer: Self-pay | Admitting: Neurology

## 2017-07-16 ENCOUNTER — Telehealth: Payer: Self-pay | Admitting: *Deleted

## 2017-07-16 NOTE — Telephone Encounter (Signed)
No showed new patient appointment. 

## 2017-07-17 ENCOUNTER — Encounter: Payer: Self-pay | Admitting: Neurology

## 2017-07-24 MED FILL — levETIRAcetam 750 MG TABS: 750 | 30 days supply | Qty: 60 | Fill #0

## 2017-08-28 MED FILL — levETIRAcetam 750 MG TABS: 750 | 30 days supply | Qty: 60 | Fill #1

## 2017-09-18 ENCOUNTER — Emergency Department (HOSPITAL_COMMUNITY): Payer: BLUE CROSS/BLUE SHIELD

## 2017-09-18 ENCOUNTER — Emergency Department (HOSPITAL_COMMUNITY)
Admission: EM | Admit: 2017-09-18 | Discharge: 2017-09-18 | Disposition: A | Discharge status: Home / Self Care | Payer: BLUE CROSS/BLUE SHIELD | Attending: Emergency Medicine | Admitting: Emergency Medicine

## 2017-09-18 ENCOUNTER — Encounter (HOSPITAL_COMMUNITY): Payer: Self-pay | Admitting: *Deleted

## 2017-09-18 DIAGNOSIS — F17210 Nicotine dependence, cigarettes, uncomplicated: Secondary | ICD-10-CM | POA: Diagnosis not present

## 2017-09-18 DIAGNOSIS — S62336D Displaced fracture of neck of fifth metacarpal bone, right hand, subsequent encounter for fracture with routine healing: Secondary | ICD-10-CM

## 2017-09-18 DIAGNOSIS — R569 Unspecified convulsions: Secondary | ICD-10-CM | POA: Insufficient documentation

## 2017-09-18 DIAGNOSIS — F1029 Alcohol dependence with unspecified alcohol-induced disorder: Secondary | ICD-10-CM

## 2017-09-18 DIAGNOSIS — M79641 Pain in right hand: Secondary | ICD-10-CM | POA: Diagnosis not present

## 2017-09-18 DIAGNOSIS — X58XXXD Exposure to other specified factors, subsequent encounter: Secondary | ICD-10-CM | POA: Diagnosis not present

## 2017-09-18 LAB — BASIC METABOLIC PANEL
ANION GAP: 16 — AB (ref 5–15)
BUN: 10 mg/dL (ref 6–20)
CHLORIDE: 94 mmol/L — AB (ref 101–111)
CO2: 19 mmol/L — AB (ref 22–32)
Calcium: 8.7 mg/dL — ABNORMAL LOW (ref 8.9–10.3)
Creatinine, Ser: 1.13 mg/dL (ref 0.61–1.24)
GFR calc non Af Amer: >60 mL/min (ref >60)
Glucose, Bld: 85 mg/dL (ref 65–99)
Potassium: 4.4 mmol/L (ref 3.5–5.1)
Sodium: 129 mmol/L — ABNORMAL LOW (ref 135–145)

## 2017-09-18 LAB — CBC WITH DIFFERENTIAL/PLATELET
BASOS ABS: 0.1 10*3/uL (ref 0.0–0.1)
BASOS PCT: 1 %
Eosinophils Absolute: 0 10*3/uL (ref 0.0–0.7)
Eosinophils Relative: 1 %
HEMATOCRIT: 39.5 % (ref 39.0–52.0)
HEMOGLOBIN: 14 g/dL (ref 13.0–17.0)
Lymphocytes Relative: 15 %
Lymphs Abs: 0.9 10*3/uL (ref 0.7–4.0)
MCH: 32.9 pg (ref 26.0–34.0)
MCHC: 35.4 g/dL (ref 30.0–36.0)
MCV: 92.9 fL (ref 78.0–100.0)
MONOS PCT: 17 %
Monocytes Absolute: 1.1 10*3/uL — ABNORMAL HIGH (ref 0.1–1.0)
NEUTROS ABS: 4.3 10*3/uL (ref 1.7–7.7)
NEUTROS PCT: 66 %
Platelets: 204 10*3/uL (ref 150–400)
RBC: 4.25 MIL/uL (ref 4.22–5.81)
RDW: 13.3 % (ref 11.5–15.5)
WBC: 6.3 10*3/uL (ref 4.0–10.5)

## 2017-09-18 LAB — CBG MONITORING, ED: GLUCOSE-CAPILLARY: 90 mg/dL (ref 65–99)

## 2017-09-18 MED ORDER — SODIUM CHLORIDE 0.9 % IV SOLN
500.0000 mg | Freq: Once | INTRAVENOUS | Status: AC
  Administered 2017-09-18: 500 mg via INTRAVENOUS
  Filled 2017-09-18: qty 5

## 2017-09-18 MED ORDER — CHLORDIAZEPOXIDE HCL 25 MG PO CAPS: ORAL_CAPSULE | ORAL | 0 refills | Status: DC

## 2017-09-18 MED ORDER — SODIUM CHLORIDE 0.9 % IV SOLN
1000.0000 mg | Freq: Once | INTRAVENOUS | Status: DC
  Filled 2017-09-18: qty 10

## 2017-09-18 MED ORDER — CHLORDIAZEPOXIDE HCL 25 MG PO CAPS
50.0000 mg | ORAL_CAPSULE | Freq: Once | ORAL | Status: AC
  Administered 2017-09-18: 50 mg via ORAL
  Filled 2017-09-18: qty 2

## 2017-09-18 MED ORDER — SODIUM CHLORIDE 0.9 % IV BOLUS (SEPSIS)
1000.0000 mL | Freq: Once | INTRAVENOUS | Status: AC
  Administered 2017-09-18: 1000 mL via INTRAVENOUS

## 2017-09-18 MED ORDER — LEVETIRACETAM 500 MG PO TABS: 1000.0000 mg | ORAL_TABLET | Freq: Two times a day (BID) | ORAL | 0 refills | Status: DC

## 2017-09-18 NOTE — ED Notes (Signed)
Pt ambulated to restroom from room, tolerated well. 

## 2017-09-18 NOTE — ED Notes (Signed)
Paged ortho again

## 2017-09-18 NOTE — Progress Notes (Signed)
Orthopedic Tech Progress Note Patient Details:  Dwayne IsaacsRobert Bolton [birth year] [id number]  Ortho Devices Type of Ortho Device: Ulna gutter splint Ortho Device/Splint Location: Right Ortho Device/Splint Interventions: Application   Alvina ChouWilliams, Dusty Raczkowski C 09/18/2017, 2:06 PM

## 2017-09-18 NOTE — ED Notes (Signed)
Ortho at bedside.

## 2017-09-18 NOTE — ED Triage Notes (Signed)
Pt reports having a seizure at some point last night. States his friend witnessed it and brought him here this am. Pt is awake and alert & oriented at triage. Has hx of seizures and reports taking his meds as prescribed.

## 2017-09-18 NOTE — ED Notes (Signed)
Paged ortho 

## 2017-09-18 NOTE — ED Provider Notes (Signed)
[facility] EMERGENCY DEPARTMENT Provider Note   CSN: [id number] Arrival date & time: 09/18/17  4098     History   Chief Complaint Chief Complaint  Patient presents with  . Seizures    HPI Dwayne Bolton is a 41 y.o. male.  HPI   Reports he yelled out and friend heard him and walked into kitchen and found him shaking on the floor.  Reports it occurred just prior to arrival.  Didn't bite tongue. No headache.  Just broke hand not too long ago. Reports he feels it coming on then woke up with ambulance.  Reports taking keppra but not gabapentin, reports was taking it for nerve pain, have been off of gabapentin for a while.  Keppra was increased from 500mg  to 750mg  a little while ago, haven't missed any doses.  Reports drinking etoh at work, sweats, thinks drinking, got dehydrated and had a seizure.  Reports stopped drinking about 4 days ago, felt some shakiness.  Reports improved with watching TV.   Thinks fell onto right hand which was previously fractured and now it hurts more again  Past Medical History:  Diagnosis Date  . Alcohol abuse   . Seizure [facility])     Patient Active Problem List   Diagnosis Date Noted  . Tobacco dependence 12/31/2015  . Seizure disorder (HCC) 12/31/2015  . Convulsion (HCC)   . Alcohol withdrawal seizure (HCC) 03/22/2015  . GAD (generalized anxiety disorder) 01/13/2014  . Adjustment disorder with mixed anxiety and depressed mood 01/13/2014  . Seizures (HCC) 01/12/2014  . Elevated liver enzymes 01/12/2014  . Alcohol dependence (HCC) 01/11/2014    History reviewed. No pertinent surgical history.     Home Medications    Prior to Admission medications   Medication Sig Start Date End Date Taking? Authorizing Provider  chlordiazePOXIDE (LIBRIUM) 25 MG capsule 50mg  PO TID x 1D, then 25-50mg  PO BID X 1D, then 25-50mg  PO QD X 1D 09/18/17   Alvira Monday, MD  gabapentin (NEURONTIN) 100 MG capsule Take 1 capsule (100 mg total) by  mouth 3 (three) times daily. Patient not taking: Reported on 09/18/2017 06/24/17   Massie Maroon, FNP  levETIRAcetam (KEPPRA) 500 MG tablet Take 2 tablets (1,000 mg total) by mouth 2 (two) times daily. 09/18/17 10/18/17  Alvira Monday, MD    Family History Family History  Problem Relation Age of Onset  . Diabetes Mother   . Hypertension Mother     Social History Social History   Tobacco Use  . Smoking status: Current Every Day Smoker    Packs/day: 0.25    Years: 4.00    Pack years: 1.00    Types: Cigarettes  . Smokeless tobacco: Former Engineer, water Use Topics  . Alcohol use: No    Comment: every once in a while   . Drug use: No     Allergies   Ibuprofen   Review of Systems Review of Systems  Constitutional: Negative for fever.  HENT: Negative for sore throat.   Eyes: Negative for visual disturbance.  Respiratory: Negative for shortness of breath.   Cardiovascular: Negative for chest pain.  Gastrointestinal: Negative for abdominal pain, nausea and vomiting.  Genitourinary: Negative for difficulty urinating.  Musculoskeletal: Positive for arthralgias (mild hand pain). Negative for back pain and neck stiffness.  Skin: Negative for rash.  Neurological: Positive for seizures. Negative for syncope and headaches.     Physical Exam Updated Vital Signs BP (!) 146/97 (BP Location: Left Arm)  Pulse 81   Temp 98.5 F (36.9 C) (Oral)   Resp 16   SpO2 100%   Physical Exam  Constitutional: He is oriented to person, place, and time. He appears well-developed and well-nourished. No distress.  HENT:  Head: Normocephalic and atraumatic.  Eyes: Conjunctivae and EOM are normal.  Neck: Normal range of motion.  Cardiovascular: Normal rate, regular rhythm, normal heart sounds and intact distal pulses. Exam reveals no gallop and no friction rub.  No murmur heard. Pulmonary/Chest: Effort normal and breath sounds normal. No respiratory distress. He has no wheezes. He  has no rales.  Abdominal: Soft. He exhibits no distension. There is no tenderness. There is no guarding.  Musculoskeletal: He exhibits no edema.  Neurological: He is alert and oriented to person, place, and time.  Mild tremor of finger tips with arm extension  Skin: Skin is warm and dry. He is not diaphoretic.  Nursing note and vitals reviewed.    ED Treatments / Results  Labs (all labs ordered are listed, but only abnormal results are displayed) Labs Reviewed  CBC WITH DIFFERENTIAL/PLATELET - Abnormal; Notable for the following components:      Result Value   Monocytes Absolute 1.1 (*)    All other components within normal limits  BASIC METABOLIC PANEL - Abnormal; Notable for the following components:   Sodium 129 (*)    Chloride 94 (*)    CO2 19 (*)    Calcium 8.7 (*)    Anion gap 16 (*)    All other components within normal limits  CBG MONITORING, ED    EKG  EKG Interpretation None       Radiology Dg Hand Complete Right  Result Date: 09/18/2017 CLINICAL DATA:  Status post fall. EXAM: RIGHT HAND - COMPLETE 3+ VIEW COMPARISON:  07/12/2017 FINDINGS: Old fracture deformity of the right fifth metacarpal neck with apex dorsal angulation with a subtle lucency along the dorsal aspect concerning for a site of re-fracture or partially ununited fracture. No other fracture or dislocation. Mild soft tissue swelling over the fifth metacarpal. IMPRESSION: 1. Old fracture deformity of the right fifth metacarpal neck with apex dorsal angulation with a subtle lucency along the dorsal aspect concerning for a site of re-fracture or partially ununited fracture. Electronically Signed   By: Elige KoHetal  Patel   On: 09/18/2017 11:18    Procedures Procedures (including critical care time)  Medications Ordered in ED Medications  sodium chloride 0.9 % bolus 1,000 mL (0 mLs Intravenous Stopped 09/18/17 1130)  levETIRAcetam (KEPPRA) 500 mg in sodium chloride 0.9 % 100 mL IVPB (0 mg Intravenous  Stopped 09/18/17 1053)  chlordiazePOXIDE (LIBRIUM) capsule 50 mg (50 mg Oral Given 09/18/17 1006)     Initial Impression / Assessment and Plan / ED Course  I have reviewed the triage vital signs and the nursing notes.  Pertinent labs & imaging results that were available during my care of the patient were reviewed by me and considered in my medical decision making (see chart for details).    41 year old male with a history of alcohol abuse and seizures presents with concern for seizure.  Glucose within normal limits.  Patient with normal vital signs, no signs of anxiety.  He is noted to have mild tremor of fingers with arm extension, however given otherwise normal exam and vital signs, have low suspicion that seizure was secondary to withdrawal and feel it is more likely secondary to underlying seizure disorder.  Patient given librium in ED, normal saline, and  additional Keppra load.  Labs show hyponatremia which is similar to prior values without other significant electrolyte abnormalities. Bicarb 19 likely in setting of recent seizure. XR hand with prior fracture, possible acute portion or area of nonunion. Placed in ulnar gutter given more acute pain and tenderness over area and recommend hand surgery follow up.   Will increase Keppra to 1000 mg BID and recommend follow up with Neurology.  Will place patient on librium taper and recommend alcohol counseling.    Final Clinical Impressions(s) / ED Diagnoses   Final diagnoses:  Seizure (HCC)  Closed displaced fracture of neck of fifth metacarpal bone of right hand with routine healing, subsequent encounter, with new fracture or with area of nonunion  Alcohol dependence with unspecified alcohol-induced disorder [facility](HCC)    ED Discharge Orders        Ordered    levETIRAcetam (KEPPRA) 500 MG tablet  2 times daily     09/18/17 1236    chlordiazePOXIDE (LIBRIUM) 25 MG capsule     09/18/17 1311       Alvira MondaySchlossman, Elya Tarquinio, MD 09/18/17 2109

## 2017-09-18 NOTE — ED Notes (Signed)
Spoke with Ortho will arrive to place splint

## 2017-11-04 ENCOUNTER — Telehealth: Payer: Self-pay

## 2017-11-04 NOTE — Telephone Encounter (Signed)
We don't prescribe this for him. He should talk to neurologist. Thanks!

## 2017-11-14 ENCOUNTER — Emergency Department (HOSPITAL_COMMUNITY)
Admission: EM | Admit: 2017-11-14 | Discharge: 2017-11-14 | Disposition: A | Discharge status: Home / Self Care | Payer: BLUE CROSS/BLUE SHIELD | Attending: Physician Assistant | Admitting: Physician Assistant

## 2017-11-14 ENCOUNTER — Other Ambulatory Visit: Payer: Self-pay

## 2017-11-14 ENCOUNTER — Encounter (HOSPITAL_COMMUNITY): Payer: Self-pay

## 2017-11-14 DIAGNOSIS — Z760 Encounter for issue of repeat prescription: Secondary | ICD-10-CM | POA: Diagnosis present

## 2017-11-14 DIAGNOSIS — F17210 Nicotine dependence, cigarettes, uncomplicated: Secondary | ICD-10-CM | POA: Diagnosis not present

## 2017-11-14 DIAGNOSIS — R569 Unspecified convulsions: Secondary | ICD-10-CM

## 2017-11-14 MED ORDER — LEVETIRACETAM 500 MG PO TABS: 1000.0000 mg | ORAL_TABLET | Freq: Two times a day (BID) | ORAL | 1 refills | Status: DC

## 2017-11-14 NOTE — Discharge Instructions (Signed)
Please take your Keppra as prescribed.  Go to your neurology appointment for follow-up and medication maintenance that you have scheduled in March.  Please return to the emergency department if you have a seizure or have any new or concerning symptoms.

## 2017-11-14 NOTE — ED Triage Notes (Signed)
Pt from home for medication refill of his prescribed Keppra. Pt reports he has not yet run out of his medication, he just needs a refill. Pt A&Ox4. No other complains. Ambulatory to room

## 2017-11-14 NOTE — ED Notes (Signed)
Called for triage x's 3 without response

## 2017-11-14 NOTE — ED Notes (Signed)
Called pt for triage x 3 with no answer.

## 2017-11-14 NOTE — ED Notes (Signed)
VSS. Pt verbalized understanding of d/c instructions, f/u, and prescriptions. Pt ambulatory to lobby with steady gait

## 2017-11-14 NOTE — ED Notes (Addendum)
Pt at desk asking about wait time; pt informed that his named was called several times with no answer; pt states that he "must have been sleeping" and "has seizures"; pt's name moved back to waiting room and this tech assured pt that he would be taken back to a room shortly

## 2017-11-14 NOTE — ED Provider Notes (Signed)
[facility] EMERGENCY DEPARTMENT Provider Note   CSN: [id number] Arrival date & time: 11/14/17  1548     History   Chief Complaint Chief Complaint  Patient presents with  . Medication Refill    HPI Dwayne Bolton is a 42 y.o. male.  HPI   Dwayne Bolton is a 42 year old male with a history of alcohol abuse and seizures who presents to the emergency department for medication refill.  Patient states that he will run out of his Keppra medication tomorrow.   States that he takes 1000 mg Keppra twice daily ever since he was discharged from the ED 08/2017. Has not had a seizure since being on this dose of Keppra. States he is compliant with his medication. Reports that he has a neurology appointment for follow up in March.  Has no complaints.  Denies headache, numbness, weakness, chest pain, shortness of breath.  Past Medical History:  Diagnosis Date  . Alcohol abuse   . Seizure [facility])     Patient Active Problem List   Diagnosis Date Noted  . Tobacco dependence 12/31/2015  . Seizure disorder (HCC) 12/31/2015  . Convulsion (HCC)   . Alcohol withdrawal seizure (HCC) 03/22/2015  . GAD (generalized anxiety disorder) 01/13/2014  . Adjustment disorder with mixed anxiety and depressed mood 01/13/2014  . Seizures (HCC) 01/12/2014  . Elevated liver enzymes 01/12/2014  . Alcohol dependence (HCC) 01/11/2014    History reviewed. No pertinent surgical history.     Home Medications    Prior to Admission medications   Medication Sig Start Date End Date Taking? Authorizing Provider  chlordiazePOXIDE (LIBRIUM) 25 MG capsule 50mg  PO TID x 1D, then 25-50mg  PO BID X 1D, then 25-50mg  PO QD X 1D 09/18/17   Alvira Monday, MD  gabapentin (NEURONTIN) 100 MG capsule Take 1 capsule (100 mg total) by mouth 3 (three) times daily. Patient not taking: Reported on 09/18/2017 06/24/17   Massie Maroon, FNP  levETIRAcetam (KEPPRA) 500 MG tablet Take 2 tablets (1,000 mg total) by mouth 2  (two) times daily. 09/18/17 10/18/17  Alvira Monday, MD    Family History Family History  Problem Relation Age of Onset  . Diabetes Mother   . Hypertension Mother     Social History Social History   Tobacco Use  . Smoking status: Current Every Day Smoker    Packs/day: 0.25    Years: 4.00    Pack years: 1.00    Types: Cigarettes  . Smokeless tobacco: Former Engineer, water Use Topics  . Alcohol use: No    Comment: every once in a while   . Drug use: No     Allergies   Ibuprofen   Review of Systems Review of Systems  Constitutional: Negative for chills, fatigue and fever.  Respiratory: Negative for shortness of breath.   Cardiovascular: Negative for chest pain.  Gastrointestinal: Negative for abdominal pain.  Neurological: Negative for seizures, weakness, numbness and headaches.  Psychiatric/Behavioral: Negative for agitation.     Physical Exam Updated Vital Signs BP 127/87 (BP Location: Left Arm)   Pulse 100   Temp 98.2 F (36.8 C) (Oral)   Resp 14   Ht 5\' 11"  (1.803 m)   Wt 86.2 kg (190 lb)   SpO2 98%   BMI 26.50 kg/m   Physical Exam  Constitutional: He is oriented to person, place, and time. He appears well-developed and well-nourished. No distress.  Sitting at the bedside in no apparent distress.  HENT:  Head: Normocephalic and  atraumatic.  Eyes: Right eye exhibits no discharge. Left eye exhibits no discharge.  Cardiovascular: Normal rate and regular rhythm. Exam reveals no friction rub.  No murmur heard. Pulmonary/Chest: Effort normal and breath sounds normal. No stridor. No respiratory distress. He has no wheezes.  Musculoskeletal: Normal range of motion.  Neurological: He is alert and oriented to person, place, and time. Coordination normal.  Gait normal in balance and coordination.  Skin: He is not diaphoretic.  Psychiatric: He has a normal mood and affect. His behavior is normal.  Nursing note and vitals reviewed.    ED Treatments /  Results  Labs (all labs ordered are listed, but only abnormal results are displayed) Labs Reviewed - No data to display  EKG  EKG Interpretation None       Radiology No results found.  Procedures Procedures (including critical care time)  Medications Ordered in ED Medications - No data to display   Initial Impression / Assessment and Plan / ED Course  I have reviewed the triage vital signs and the nursing notes.  Pertinent labs & imaging results that were available during my care of the patient were reviewed by me and considered in my medical decision making (see chart for details).    Patient presents for medication refill of his Keppra.  Vital signs stable and he is in no apparent distress.  States that he is compliant with this medication and has not had a seizure since his dose was increased to 1000 mg daily 08/2017.  Has a follow-up appointment with neurology in March.  Denies any complaints.  Will refill medication and have him follow-up with neurology.  Have discussed return precautions and patient agrees.  Final Clinical Impressions(s) / ED Diagnoses   Final diagnoses:  Medication refill    ED Discharge Orders        Ordered    levETIRAcetam (KEPPRA) 500 MG tablet  2 times daily     11/14/17 1831       Lawrence MarseillesShrosbree, Emily J, PA-C 11/14/17 1832    Gerhard MunchLockwood, Anthoni, MD 11/18/17 [phone]

## 2017-12-23 ENCOUNTER — Ambulatory Visit: Payer: Self-pay | Admitting: Family Medicine

## 2018-01-14 MED FILL — levETIRAcetam 750 MG TABS: 750 | 30 days supply | Qty: 60 | Fill #2

## 2018-02-24 ENCOUNTER — Encounter (HOSPITAL_BASED_OUTPATIENT_CLINIC_OR_DEPARTMENT_OTHER): Payer: Self-pay | Admitting: Emergency Medicine

## 2018-02-24 ENCOUNTER — Emergency Department (HOSPITAL_BASED_OUTPATIENT_CLINIC_OR_DEPARTMENT_OTHER)
Admission: EM | Admit: 2018-02-24 | Discharge: 2018-02-24 | Disposition: A | Discharge status: Home / Self Care | Payer: BLUE CROSS/BLUE SHIELD | Attending: Emergency Medicine | Admitting: Emergency Medicine

## 2018-02-24 ENCOUNTER — Other Ambulatory Visit: Payer: Self-pay

## 2018-02-24 DIAGNOSIS — K047 Periapical abscess without sinus: Secondary | ICD-10-CM | POA: Insufficient documentation

## 2018-02-24 DIAGNOSIS — F17210 Nicotine dependence, cigarettes, uncomplicated: Secondary | ICD-10-CM | POA: Insufficient documentation

## 2018-02-24 DIAGNOSIS — Z79899 Other long term (current) drug therapy: Secondary | ICD-10-CM | POA: Insufficient documentation

## 2018-02-24 MED ORDER — HYDROCODONE-ACETAMINOPHEN 5-325 MG PO TABS: 1.0000 | ORAL_TABLET | ORAL | 0 refills | Status: DC | PRN

## 2018-02-24 MED ORDER — LIDOCAINE-EPINEPHRINE (PF) 2 %-1:200000 IJ SOLN
10.0000 mL | Freq: Once | INTRAMUSCULAR | Status: AC
  Administered 2018-02-24: 10 mL via INTRADERMAL
  Filled 2018-02-24 (×2): qty 10

## 2018-02-24 MED ORDER — AMOXICILLIN-POT CLAVULANATE 875-125 MG PO TABS: 1.0000 | ORAL_TABLET | Freq: Two times a day (BID) | ORAL | 0 refills | Status: DC

## 2018-02-24 MED ORDER — AMOXICILLIN-POT CLAVULANATE 875-125 MG PO TABS
1.0000 | ORAL_TABLET | Freq: Once | ORAL | Status: AC
  Administered 2018-02-24: 1 via ORAL
  Filled 2018-02-24: qty 1

## 2018-02-24 MED ORDER — IBUPROFEN 200 MG PO TABS
600.0000 mg | ORAL_TABLET | Freq: Once | ORAL | Status: AC
  Administered 2018-02-24: 600 mg via ORAL
  Filled 2018-02-24: qty 1

## 2018-02-24 MED ORDER — HYDROCODONE-ACETAMINOPHEN 5-325 MG PO TABS
2.0000 | ORAL_TABLET | Freq: Once | ORAL | Status: AC
  Administered 2018-02-24: 2 via ORAL
  Filled 2018-02-24: qty 2

## 2018-02-24 NOTE — ED Triage Notes (Signed)
Pt to ED via EMS with c/o dental pain, LT lower

## 2018-02-24 NOTE — ED Provider Notes (Signed)
[facility] EMERGENCY DEPARTMENT Provider Note   CSN: [id number] Arrival date & time: 02/24/18  1831     History   Chief Complaint Chief Complaint  Patient presents with  . Dental Pain    HPI Dwayne Bolton is a 42 y.o. male.  HPI  42 year old male presents with left dental pain and swelling.  He states that he was eating cough drops a piece broke off and hit his tooth causing it to fracture.  He states he had poor dentition for a while and has had multiple left mandibular teeth pulled.  However this was not pulled because he did not have enough money.  He is been having swelling starting this morning and has swelling to his left cheek.  Has had a low-grade fever but no drooling, shortness of breath.  Pain is severe.  He took [place] powder without relief.  Past Medical History:  Diagnosis Date  . Alcohol abuse   . Seizure [facility])     Patient Active Problem List   Diagnosis Date Noted  . Tobacco dependence 12/31/2015  . Seizure disorder (HCC) 12/31/2015  . Convulsion (HCC)   . Alcohol withdrawal seizure (HCC) 03/22/2015  . GAD (generalized anxiety disorder) 01/13/2014  . Adjustment disorder with mixed anxiety and depressed mood 01/13/2014  . Seizures (HCC) 01/12/2014  . Elevated liver enzymes 01/12/2014  . Alcohol dependence (HCC) 01/11/2014    History reviewed. No pertinent surgical history.      Home Medications    Prior to Admission medications   Medication Sig Start Date End Date Taking? Authorizing Provider  amoxicillin-clavulanate (AUGMENTIN) 875-125 MG tablet Take 1 tablet by mouth 2 (two) times daily. One po bid x 7 days 02/24/18   Pricilla Loveless, MD  chlordiazePOXIDE (LIBRIUM) 25 MG capsule  PO TID x 1D, then 25-50mg  PO BID X 1D, then 25-50mg  PO QD X 1D 09/18/17   Alvira Monday, MD  gabapentin (NEURONTIN) 100 MG capsule Take 1 capsule (100 mg total) by mouth 3 (three) times daily. Patient not taking: Reported on 09/18/2017 06/24/17   Massie Maroon, FNP  HYDROcodone-acetaminophen (NORCO) 5-325 MG tablet Take 1-2 tablets by mouth every 4 (four) hours as needed for severe pain. 02/24/18   Pricilla Loveless, MD  levETIRAcetam (KEPPRA) 500 MG tablet Take 2 tablets (1,000 mg total) by mouth 2 (two) times daily. 11/14/17 01/13/18  Kellie Shropshire, PA-C    Family History Family History  Problem Relation Age of Onset  . Diabetes Mother   . Hypertension Mother     Social History Social History   Tobacco Use  . Smoking status: Current Every Day Smoker    Packs/day: 0.25    Years: 4.00    Pack years: 1.00    Types: Cigarettes  . Smokeless tobacco: Former Engineer, water Use Topics  . Alcohol use: Yes    Comment: every once in a while   . Drug use: No     Allergies   Patient has no active allergies.   Review of Systems Review of Systems  Constitutional: Negative for fever.  HENT: Positive for dental problem and facial swelling.   Respiratory: Negative for shortness of breath.   Gastrointestinal: Negative for vomiting.     Physical Exam Updated Vital Signs BP 135/85   Pulse 99   Temp 99 F (37.2 C)   Resp 18   Ht  (1.803 m)   Wt 77.1 kg (170 lb)   SpO2 99%   BMI 23.71  kg/m   Physical Exam  Constitutional: He appears well-developed and well-nourished. No distress.  HENT:  Head: Normocephalic.    Right Ear: External ear normal.  Left Ear: External ear normal.  Nose: Nose normal.  Mouth/Throat: No trismus in the jaw.    Eyes: Right eye exhibits no discharge. Left eye exhibits no discharge.  Neck: Normal range of motion. Neck supple.  Cardiovascular: Normal rate, regular rhythm, normal heart sounds and intact distal pulses.  Pulmonary/Chest: Effort normal and breath sounds normal. No stridor. He has no wheezes.  Abdominal: He exhibits no distension.  Musculoskeletal: He exhibits no edema.  Neurological: He is alert.  Skin: Skin is warm and dry. He is not diaphoretic.  Nursing note and  vitals reviewed.    ED Treatments / Results  Labs (all labs ordered are listed, but only abnormal results are displayed) Labs Reviewed - No data to display  EKG None  Radiology No results found.  Procedures .Marland KitchenIncision and Drainage Date/Time: 02/24/2018 11:40 PM Performed by: Pricilla Loveless, MD Authorized by: Pricilla Loveless, MD   Consent:    Consent obtained:  Verbal   Consent given by:  Patient Location:    Type:  Abscess   Size:  2 cm   Location:  Mouth   Mouth location:  Alveolar process Anesthesia (see MAR for exact dosages):    Anesthesia method:  Local infiltration   Local anesthetic:  Lidocaine 2% WITH epi Procedure type:    Complexity:  Simple Procedure details:    Incision types:  Single straight   Scalpel blade:  11   Drainage:  Purulent   Drainage amount:  Copious   Wound treatment:  Wound left open   Packing materials:  None Post-procedure details:    Patient tolerance of procedure:  Tolerated well, no immediate complications   (including critical care time)  Medications Ordered in ED Medications  HYDROcodone-acetaminophen (NORCO/VICODIN) 5-325 MG per tablet 2 tablet (2 tablets Oral Given 02/24/18 2131)  ibuprofen (ADVIL,MOTRIN) tablet 600 mg (600 mg Oral Given 02/24/18 2131)  amoxicillin-clavulanate (AUGMENTIN) 875-125 MG per tablet 1 tablet (1 tablet Oral Given 02/24/18 2131)  lidocaine-EPINEPHrine (XYLOCAINE W/EPI) 2 %-1:200000 (PF) injection 10 mL (10 mLs Intradermal Given 02/24/18 2131)     Initial Impression / Assessment and Plan / ED Course  I have reviewed the triage vital signs and the nursing notes.  Pertinent labs & imaging results that were available during my care of the patient were reviewed by me and considered in my medical decision making (see chart for details).     Patient presents with a periapical abscess.  Patient otherwise is stable with no signs of impending airway compromise.  This was drained as above and now his gumline is  flat and the swelling to his external face has resolved.  He will be treated with oral antibiotics.  He does not currently have a dentist and will be given resources.  Will be given pain control.  We discussed return precautions.  Final Clinical Impressions(s) / ED Diagnoses   Final diagnoses:  Dental abscess    ED Discharge Orders        Ordered    HYDROcodone-acetaminophen (NORCO) 5-325 MG tablet  Every 4 hours PRN     02/24/18 2225    amoxicillin-clavulanate (AUGMENTIN) 875-125 MG tablet  2 times daily     02/24/18 2225       Pricilla Loveless, MD 02/24/18 2342

## 2018-02-24 NOTE — Discharge Instructions (Signed)
If you develop fevers, worsening pain or swelling, or any other new/concerning symptoms such as inability to swallow or breathe then return to the ER immediately.  Otherwise you need to follow-up with a dentist to have that tooth possibly extracted.  Take the antibiotics to completion even if you feel better.

## 2018-06-01 ENCOUNTER — Emergency Department (HOSPITAL_COMMUNITY)
Admission: EM | Admit: 2018-06-01 | Discharge: 2018-06-01 | Disposition: A | Discharge status: Home / Self Care | Payer: Self-pay | Attending: Emergency Medicine | Admitting: Emergency Medicine

## 2018-06-01 ENCOUNTER — Other Ambulatory Visit: Payer: Self-pay

## 2018-06-01 ENCOUNTER — Encounter (HOSPITAL_COMMUNITY): Payer: Self-pay

## 2018-06-01 DIAGNOSIS — Z91199 Patient's noncompliance with other medical treatment and regimen due to unspecified reason: Secondary | ICD-10-CM

## 2018-06-01 DIAGNOSIS — G40919 Epilepsy, unspecified, intractable, without status epilepticus: Secondary | ICD-10-CM

## 2018-06-01 DIAGNOSIS — Z9119 Patient's noncompliance with other medical treatment and regimen: Secondary | ICD-10-CM | POA: Insufficient documentation

## 2018-06-01 DIAGNOSIS — R569 Unspecified convulsions: Secondary | ICD-10-CM | POA: Insufficient documentation

## 2018-06-01 LAB — BASIC METABOLIC PANEL
ANION GAP: 9 (ref 5–15)
BUN: 6 mg/dL (ref 6–20)
CHLORIDE: 106 mmol/L (ref 98–111)
CO2: 22 mmol/L (ref 22–32)
Calcium: 8.2 mg/dL — ABNORMAL LOW (ref 8.9–10.3)
Creatinine, Ser: 0.85 mg/dL (ref 0.61–1.24)
GFR calc Af Amer: >60 mL/min (ref >60)
GFR calc non Af Amer: >60 mL/min (ref >60)
Glucose, Bld: 112 mg/dL — ABNORMAL HIGH (ref 70–99)
POTASSIUM: 3.4 mmol/L — AB (ref 3.5–5.1)
Sodium: 137 mmol/L (ref 135–145)

## 2018-06-01 LAB — CBC WITH DIFFERENTIAL/PLATELET
BASOS ABS: 0 10*3/uL (ref 0.0–0.1)
Basophils Relative: 0 %
EOS PCT: 0 %
Eosinophils Absolute: 0 10*3/uL (ref 0.0–0.7)
HEMATOCRIT: 39.8 % (ref 39.0–52.0)
HEMOGLOBIN: 14.3 g/dL (ref 13.0–17.0)
LYMPHS ABS: 1.3 10*3/uL (ref 0.7–4.0)
LYMPHS PCT: 11 %
MCH: 34 pg (ref 26.0–34.0)
MCHC: 35.9 g/dL (ref 30.0–36.0)
MCV: 94.5 fL (ref 78.0–100.0)
MONO ABS: 0.4 10*3/uL (ref 0.1–1.0)
Monocytes Relative: 4 %
NEUTROS ABS: 9.8 10*3/uL (ref 1.7–7.7)
Neutrophils Relative %: 85 %
Platelets: 163 10*3/uL (ref 150–400)
RBC: 4.21 MIL/uL — AB (ref 4.22–5.81)
RDW: 13.5 % (ref 11.5–15.5)
WBC: 11.5 10*3/uL — ABNORMAL HIGH (ref 4.0–10.5)

## 2018-06-01 LAB — CBG MONITORING, ED: Glucose-Capillary: 116 mg/dL — ABNORMAL HIGH (ref 70–99)

## 2018-06-01 MED ORDER — LEVETIRACETAM 500 MG PO TABS: 1000.0000 mg | ORAL_TABLET | Freq: Two times a day (BID) | ORAL | 1 refills | Status: DC

## 2018-06-01 MED ORDER — LEVETIRACETAM IN NACL 1000 MG/100ML IV SOLN
1000.0000 mg | Freq: Once | INTRAVENOUS | Status: AC
  Administered 2018-06-01: 1000 mg via INTRAVENOUS
  Filled 2018-06-01: qty 100

## 2018-06-01 MED ORDER — LEVETIRACETAM IN NACL 1000 MG/100ML IV SOLN: 1000.0000 mg | Freq: Once | INTRAVENOUS | Status: DC

## 2018-06-01 MED ORDER — DEXTROSE 5 % AND 0.45 % NACL IV BOLUS: 1000.0000 mL | Freq: Once | INTRAVENOUS | Status: DC

## 2018-06-01 NOTE — ED Triage Notes (Signed)
Patient presented to ed with c/o witness seizure. Patient also have seizure last night but was not evaluated. Patient have hx of seizure.

## 2018-06-01 NOTE — Discharge Instructions (Addendum)
You were seen in the emergency department for seizures.  Your lab work looks normal.  I think your seizures are happening again because you are not taking Keppra.  You need to restart taking Keppra twice a day as prescribed.  You need to follow-up with your primary care doctor in the next 30 days to ensure you have a refill ready.  Return to the ER for fevers, chills, neck pain or stiffness, confusion, headaches, generalized rash, one-sided numbness or weakness to your extremities, difficulty speaking or walking.

## 2018-06-01 NOTE — ED Provider Notes (Signed)
[facility] DEPT Provider Note   CSN: [id number] Arrival date & time: 06/01/18  1053     History   Chief Complaint No chief complaint on file.   HPI Dwayne Bolton is a 42 y.o. male with history of alcohol abuse, seizures, medical noncompliance, tobacco use is here for evaluation of seizures.  He has had 2 seizures in last 24 hours, previous to this his last seizures were approx 6-8 months ago.  States he used to be on Keppra twice a day but ran out of refills several months ago, currently now just taking Neurontin 3 times a day.  He does not know his Keppra or Neurontin doses.  Last time he took Neurontin was yesterday morning.  He had one witnessed seizure at 2 AM yesterday and a second wound earlier today prior to arrival.  Witness told him he looked like he was having a typical seizure with generalized body shaking.  Patient bit his sides of his tongue.  Both seizures occurred while laying down and he had no fall or head trauma.  He had no bladder or bowel incontinence during these episodes.  He feels dehydrated, has been doing more yard work the last few days and feels like at the end of the day he is dehydrated and thinks that this is is causing his seizures.  States he drinks 2-3 beers every other day, this is a long-term practice for him.  He has never had nausea, vomiting, shakes, seizures from alcohol use or withdrawal.  Denies associated headache, changes in vision, neck pain, neck stiffness, fevers, loss of sensation in hands or feet, weakness to extremities.  No recent trauma.  Patient has documented no-show appointments with primary care doctor.  HPI  Past Medical History:  Diagnosis Date  . Alcohol abuse   . Seizure [facility])     Patient Active Problem List   Diagnosis Date Noted  . Tobacco dependence 12/31/2015  . Seizure disorder ([place]) 12/31/2015  . Convulsion ([place])   . Alcohol withdrawal seizure ([place]) 03/22/2015  . GAD (generalized anxiety  disorder) 01/13/2014  . Adjustment disorder with mixed anxiety and depressed mood 01/13/2014  . Seizures ([place]) 01/12/2014  . Elevated liver enzymes 01/12/2014  . Alcohol dependence ([place]) 01/11/2014    No past surgical history on file.      Home Medications    Prior to Admission medications   Medication Sig Start Date End Date Taking? Authorizing Provider  gabapentin (NEURONTIN) 100 MG capsule Take 1 capsule (100 mg total) by mouth 3 (three) times daily. 06/24/17  Yes Dorena Dew, FNP  amoxicillin-clavulanate (AUGMENTIN) 875-125 MG tablet Take 1 tablet by mouth 2 (two) times daily. One po bid x 7 days Patient not taking: Reported on 06/01/2018 02/24/18   Sherwood Gambler, MD  chlordiazePOXIDE (LIBRIUM) 25 MG capsule 78m PO TID x 1D, then 25-532mPO BID X 1D, then 25-5047mO QD X 1D Patient not taking: Reported on 06/01/2018 09/18/17   SchGareth MorganD  HYDROcodone-acetaminophen (NORCO) 5-325 MG tablet Take 1-2 tablets by mouth every 4 (four) hours as needed for severe pain. Patient not taking: Reported on 06/01/2018 02/24/18   GolSherwood GamblerD  levETIRAcetam (KEPPRA) 500 MG tablet Take 2 tablets (1,000 mg total) by mouth 2 (two) times daily. 06/01/18 07/31/18  GibKinnie FeilA-C    Family History Family History  Problem Relation Age of Onset  . Diabetes Mother   . Hypertension Mother     Social History Social  History   Tobacco Use  . Smoking status: Current Every Day Smoker    Packs/day: 0.25    Years: 4.00    Pack years: 1.00    Types: Cigarettes  . Smokeless tobacco: Former Network engineer Use Topics  . Alcohol use: Yes    Comment: every once in a while   . Drug use: No     Allergies   Patient has no known allergies.   Review of Systems Review of Systems  Neurological: Positive for seizures.  All other systems reviewed and are negative.    Physical Exam Updated Vital Signs BP 129/80 (BP Location: Right Arm)   Pulse 70   Temp 98.4 F (36.9 C)  (Oral)   Resp 20   Ht 5' 11"  (1.803 m)   Wt 77 kg   SpO2 98%   BMI 23.68 kg/m   Physical Exam  Constitutional: He is oriented to person, place, and time. He appears well-developed and well-nourished. No distress.  NAD.  HENT:  Head: Normocephalic and atraumatic.  Right Ear: External ear normal.  Left Ear: External ear normal.  Nose: Nose normal.  Tiny skin tear to bilateral lateral tongue, hemostatic.  Eyes: Conjunctivae and EOM are normal.  Neck: Normal range of motion. Neck supple.  Cardiovascular: Normal rate, regular rhythm, normal heart sounds and intact distal pulses.  Pulmonary/Chest: Effort normal and breath sounds normal.  Musculoskeletal: Normal range of motion. He exhibits no deformity.  Neurological: He is alert and oriented to person, place, and time.  Alert and oriented to self, place, time and event.  Speech is fluent without obvious dysarthria or dysphasia. Strength 5/5 with hand grip and ankle F/E.   Sensation to light touch intact in hands and feet. No truncal sway. No pronator drift. No leg drop.  Normal finger-to-nose and finger tapping.  CN I and VIII not tested. CN II-XII grossly intact bilaterally.   Skin: Skin is warm and dry. Capillary refill takes less than 2 seconds.  Psychiatric: He has a normal mood and affect. His behavior is normal. Judgment and thought content normal.  Nursing note and vitals reviewed.    ED Treatments / Results  Labs (all labs ordered are listed, but only abnormal results are displayed) Labs Reviewed  BASIC METABOLIC PANEL - Abnormal; Notable for the following components:      Result Value   Potassium 3.4 (*)    Glucose, Bld 112 (*)    Calcium 8.2 (*)    All other components within normal limits  CBC WITH DIFFERENTIAL/PLATELET - Abnormal; Notable for the following components:   WBC 11.5 (*)    RBC 4.21 (*)    All other components within normal limits  CBG MONITORING, ED - Abnormal; Notable for the following  components:   Glucose-Capillary 116 (*)    All other components within normal limits    EKG None  Radiology No results found.  Procedures Procedures (including critical care time)  Medications Ordered in ED Medications  levETIRAcetam (KEPPRA) IVPB 1000 mg/100 mL premix (1,000 mg Intravenous New Bag/Given 06/01/18 1154)     Initial Impression / Assessment and Plan / ED Course  I have reviewed the triage vital signs and the nursing notes.  Pertinent labs & imaging results that were available during my care of the patient were reviewed by me and considered in my medical decision making (see chart for details).     42 year old male with history of alcohol abuse and seizures, medical noncompliance is here  for 2 seizures in the last 24 hours, in setting of medical noncompliance with Keppra for several months.  Considered EtOH withdrawal however he adamantly denies heavy EtOH use, states he drinks 2-3 beers every other day but has never had withdrawal symptoms.  He does not look like he is withdrawing. No preceding head trauma making hematoma or hemorrhage unlikely.  No associated infectious symptoms, neck rigidity or meningismus to raise suspicion for meningitis.  His neuro exam is normal.  Work-up today unremarkable without signs of electrolyte abnormalities to cause seizure.  Most likely breakthrough seizure in setting of noncompliance.  Patient was loaded with Keppra in the ER.  No seizure-like activity here.  No admission criteria met.  Patient considered appropriate for discharge with refills on Keppra, encouraged following up with PCP for continued refills.  Discussed return precautions.  Patient is in agreement. Final Clinical Impressions(s) / ED Diagnoses   Final diagnoses:  Breakthrough seizure [facility])  Medical non-compliance    ED Discharge Orders         Ordered    levETIRAcetam (KEPPRA) 500 MG tablet  2 times daily     06/01/18 1351           Arlean Hopping 06/01/18 1352    Duffy Bruce, MD 06/03/18 1235

## 2018-06-01 NOTE — ED Notes (Signed)
Bed: WA05 Expected date:  Expected time:  Means of arrival:  Comments: seizure 

## 2018-06-12 ENCOUNTER — Emergency Department (HOSPITAL_COMMUNITY)
Admission: EM | Admit: 2018-06-12 | Discharge: 2018-06-12 | Disposition: A | Discharge status: Home / Self Care | Payer: Self-pay | Attending: Emergency Medicine | Admitting: Emergency Medicine

## 2018-06-12 ENCOUNTER — Other Ambulatory Visit: Payer: Self-pay

## 2018-06-12 ENCOUNTER — Encounter (HOSPITAL_COMMUNITY): Payer: Self-pay | Admitting: Emergency Medicine

## 2018-06-12 DIAGNOSIS — K047 Periapical abscess without sinus: Secondary | ICD-10-CM

## 2018-06-12 DIAGNOSIS — F17210 Nicotine dependence, cigarettes, uncomplicated: Secondary | ICD-10-CM | POA: Insufficient documentation

## 2018-06-12 DIAGNOSIS — Z79899 Other long term (current) drug therapy: Secondary | ICD-10-CM | POA: Insufficient documentation

## 2018-06-12 MED ORDER — PENICILLIN V POTASSIUM 500 MG PO TABS: 500.0000 mg | ORAL_TABLET | Freq: Three times a day (TID) | ORAL | 0 refills | Status: DC

## 2018-06-12 MED ORDER — IBUPROFEN 600 MG PO TABS: 600.0000 mg | ORAL_TABLET | Freq: Four times a day (QID) | ORAL | 0 refills | Status: DC | PRN

## 2018-06-12 NOTE — ED Notes (Signed)
ED Provider at bedside. 

## 2018-06-12 NOTE — ED Provider Notes (Signed)
[facility] EMERGENCY DEPARTMENT Provider Note   CSN: [id number] Arrival date & time: 06/12/18  4098     History   Chief Complaint Chief Complaint  Patient presents with  . Dental Problem    HPI Dwayne Bolton is a 42 y.o. male.  The history is provided by the patient. No language interpreter was used.     42 year old male with history of seizure disorder, and alcohol abuse presenting complaining of dental pain.  Patient report for the past 3 days he has had gradual onset of pain to his left lower tooth.  He described pain as a pulsating throbbing sensation, 10 out of 10 and now with associated facial swelling.  Pain is worse with chewing or with cold temperature.  Pain is nonradiating.  No associated fever, trouble swallowing or neck pain.  He did reach out to a dentist and have an appointment scheduled in the next 2 weeks.  He has been taking ibuprofen at home without adequate relief.  He denies any recent injury.  Past Medical History:  Diagnosis Date  . Alcohol abuse   . Seizure [facility])     Patient Active Problem List   Diagnosis Date Noted  . Tobacco dependence 12/31/2015  . Seizure disorder ([facility]) 12/31/2015  . Convulsion ([facility])   . Alcohol withdrawal seizure ([facility]) 03/22/2015  . GAD (generalized anxiety disorder) 01/13/2014  . Adjustment disorder with mixed anxiety and depressed mood 01/13/2014  . Seizures ([facility]) 01/12/2014  . Elevated liver enzymes 01/12/2014  . Alcohol dependence ([facility]) 01/11/2014    History reviewed. No pertinent surgical history.      Home Medications    Prior to Admission medications   Medication Sig Start Date End Date Taking? Authorizing Provider  amoxicillin-clavulanate (AUGMENTIN) 875-125 MG tablet Take 1 tablet by mouth 2 (two) times daily. One po bid x 7 days Patient not taking: Reported on 06/01/2018 02/24/18   Pricilla Loveless, MD  chlordiazePOXIDE (LIBRIUM) 25 MG capsule 50mg  PO TID x 1D, then 25-50mg  PO BID X 1D, then  25-50mg  PO QD X 1D Patient not taking: Reported on 06/01/2018 09/18/17   Alvira Monday, MD  gabapentin (NEURONTIN) 100 MG capsule Take 1 capsule (100 mg total) by mouth 3 (three) times daily. 06/24/17   Massie Maroon, FNP  HYDROcodone-acetaminophen (NORCO) 5-325 MG tablet Take 1-2 tablets by mouth every 4 (four) hours as needed for severe pain. Patient not taking: Reported on 06/01/2018 02/24/18   Pricilla Loveless, MD  levETIRAcetam (KEPPRA) 500 MG tablet Take 2 tablets (1,000 mg total) by mouth 2 (two) times daily. 06/01/18 07/31/18  Liberty Handy, PA-C    Family History Family History  Problem Relation Age of Onset  . Diabetes Mother   . Hypertension Mother     Social History Social History   Tobacco Use  . Smoking status: Current Every Day Smoker    Packs/day: 0.25    Years: 4.00    Pack years: 1.00    Types: Cigarettes  . Smokeless tobacco: Former Engineer, water Use Topics  . Alcohol use: Yes    Comment: every once in a while   . Drug use: No     Allergies   Patient has no known allergies.   Review of Systems Review of Systems  Constitutional: Negative for fever.  HENT: Positive for dental problem and facial swelling.      Physical Exam Updated Vital Signs BP 132/81 (BP Location: Right Arm)   Pulse 89   Temp  98.9 F (37.2 C) (Oral)   Resp 20   SpO2 100%   Physical Exam  Constitutional: He appears well-developed and well-nourished. No distress.  HENT:  Head: Atraumatic.  Mouth: Significant dental decay noted to tooth #18 with exquisite tenderness to palpation and surrounding gingival erythema and edema.  Associate adjacent facial swelling were noted.  No trismus.  Throat exam unremarkable.  Eyes: Conjunctivae are normal.  Neck: Neck supple.  Neurological: He is alert.  Skin: No rash noted.  Psychiatric: He has a normal mood and affect.  Nursing note and vitals reviewed.    ED Treatments / Results  Labs (all labs ordered are listed, but only  abnormal results are displayed) Labs Reviewed - No data to display  EKG None  Radiology No results found.  Procedures Procedures (including critical care time)  Medications Ordered in ED Medications - No data to display   Initial Impression / Assessment and Plan / ED Course  I have reviewed the triage vital signs and the nursing notes.  Pertinent labs & imaging results that were available during my care of the patient were reviewed by me and considered in my medical decision making (see chart for details).     BP 132/81 (BP Location: Right Arm)   Pulse 89   Temp 98.9 F (37.2 C) (Oral)   Resp 20   SpO2 100%    Final Clinical Impressions(s) / ED Diagnoses   Final diagnoses:  Periapical abscess with facial involvement    ED Discharge Orders         Ordered    ibuprofen (ADVIL,MOTRIN) 600 MG tablet  Every 6 hours PRN     06/12/18 0843    penicillin v potassium (VEETID) 500 MG tablet  3 times daily     06/12/18 0843         8:41 AM Patient with dental pain and facial swelling suggestive of periapical abscess with facial involvement.  No findings to suggest deep tissue infection.  Will initiate antibiotic and anti-inflammatory medication.  Dental referral given as needed.  Return precautions discussed.   Fayrene Helperran, Terese Heier, PA-C 06/12/18 0845    Gerhard MunchLockwood, [place], MD 06/12/18 [id number]

## 2018-06-12 NOTE — ED Triage Notes (Signed)
Pt. Stated, I have a tooth that need to come out. I need to have it pulled and that's not scheduled for 2 weeks.

## 2018-06-12 NOTE — ED Notes (Signed)
Patient verbalizes understanding of discharge instructions. Opportunity for questioning and answers were provided. Armband removed by staff, pt discharged from ED.  

## 2018-08-08 ENCOUNTER — Other Ambulatory Visit: Payer: Self-pay

## 2018-08-08 ENCOUNTER — Emergency Department (HOSPITAL_COMMUNITY)
Admission: EM | Admit: 2018-08-08 | Discharge: 2018-08-08 | Disposition: A | Discharge status: Home / Self Care | Payer: Self-pay | Attending: Emergency Medicine | Admitting: Emergency Medicine

## 2018-08-08 ENCOUNTER — Encounter (HOSPITAL_COMMUNITY): Payer: Self-pay | Admitting: Emergency Medicine

## 2018-08-08 DIAGNOSIS — G40909 Epilepsy, unspecified, not intractable, without status epilepticus: Secondary | ICD-10-CM

## 2018-08-08 DIAGNOSIS — Z79899 Other long term (current) drug therapy: Secondary | ICD-10-CM | POA: Insufficient documentation

## 2018-08-08 DIAGNOSIS — R569 Unspecified convulsions: Secondary | ICD-10-CM | POA: Insufficient documentation

## 2018-08-08 DIAGNOSIS — F17210 Nicotine dependence, cigarettes, uncomplicated: Secondary | ICD-10-CM | POA: Insufficient documentation

## 2018-08-08 LAB — CBC WITH DIFFERENTIAL/PLATELET
ABS IMMATURE GRANULOCYTES: 0.07 10*3/uL (ref 0.00–0.07)
BASOS ABS: 0.1 10*3/uL (ref 0.0–0.1)
Basophils Relative: 1 %
EOS ABS: 0.3 10*3/uL (ref 0.0–0.5)
Eosinophils Relative: 2 %
HEMATOCRIT: 40.9 % (ref 39.0–52.0)
HEMOGLOBIN: 14.2 g/dL (ref 13.0–17.0)
IMMATURE GRANULOCYTES: 1 %
LYMPHS ABS: 2.7 10*3/uL (ref 0.7–4.0)
LYMPHS PCT: 18 %
MCH: 32.1 pg (ref 26.0–34.0)
MCHC: 34.7 g/dL (ref 30.0–36.0)
MCV: 92.5 fL (ref 80.0–100.0)
MONOS PCT: 9 %
Monocytes Absolute: 1.4 10*3/uL — ABNORMAL HIGH (ref 0.1–1.0)
NEUTROS ABS: 10.4 10*3/uL — AB (ref 1.7–7.7)
NEUTROS PCT: 69 %
NRBC: 0 % (ref 0.0–0.2)
Platelets: 346 10*3/uL (ref 150–400)
RBC: 4.42 MIL/uL (ref 4.22–5.81)
RDW: 11.9 % (ref 11.5–15.5)
WBC: 15 10*3/uL — ABNORMAL HIGH (ref 4.0–10.5)

## 2018-08-08 LAB — COMPREHENSIVE METABOLIC PANEL
ALBUMIN: 3.5 g/dL (ref 3.5–5.0)
ALK PHOS: 58 U/L (ref 38–126)
ALT: 16 U/L (ref 0–44)
AST: 19 U/L (ref 15–41)
Anion gap: 11 (ref 5–15)
BILIRUBIN TOTAL: 0.6 mg/dL (ref 0.3–1.2)
BUN: 15 mg/dL (ref 6–20)
CALCIUM: 9 mg/dL (ref 8.9–10.3)
CO2: 24 mmol/L (ref 22–32)
Chloride: 100 mmol/L (ref 98–111)
Creatinine, Ser: 1.18 mg/dL (ref 0.61–1.24)
GFR calc Af Amer: >60 mL/min (ref >60)
GLUCOSE: 124 mg/dL — AB (ref 70–99)
POTASSIUM: 3.8 mmol/L (ref 3.5–5.1)
Sodium: 135 mmol/L (ref 135–145)
TOTAL PROTEIN: 6.6 g/dL (ref 6.5–8.1)

## 2018-08-08 MED ORDER — LEVETIRACETAM 500 MG PO TABS: 1000.0000 mg | ORAL_TABLET | Freq: Two times a day (BID) | ORAL | 0 refills | Status: DC

## 2018-08-08 MED ORDER — LEVETIRACETAM IN NACL 1000 MG/100ML IV SOLN
1000.0000 mg | Freq: Once | INTRAVENOUS | Status: AC
  Administered 2018-08-08: 1000 mg via INTRAVENOUS
  Filled 2018-08-08: qty 100

## 2018-08-08 NOTE — ED Provider Notes (Signed)
[facility] EMERGENCY DEPARTMENT Provider Note   CSN: [id number] Arrival date & time: 08/08/18  1826     History   Chief Complaint Chief Complaint  Patient presents with  . Seizures    HPI Dwayne Bolton is a 42 y.o. male.  42 y.o male with a PMH of seizure disorder,alcohol withdrawal seizure presents to the ED with a chief complaint of seizure.  He reports that he is prescribed Keppra but has been out of his medication for the past 6 days.  According to his brother patient had a seizure this afternoon but patient reports he does not remember this.  Patient states he try obtaining his medication for [facility] but they did not have his prescription ready.  Is not taking any of his medication for the past 6 days he reports not gotten the prescription filled.  Patient is seen by Julianne Handler manages him seizure disorder.  Patient reports he has not had an alcoholic beverage in a few months.  Denies any recent contacts, fever, shortness of breath, chest pain or abdominal pain.     Past Medical History:  Diagnosis Date  . Alcohol abuse   . Seizure [facility])     Patient Active Problem List   Diagnosis Date Noted  . Tobacco dependence 12/31/2015  . Seizure disorder ([facility]) 12/31/2015  . Convulsion ([facility])   . Alcohol withdrawal seizure ([facility]) 03/22/2015  . GAD (generalized anxiety disorder) 01/13/2014  . Adjustment disorder with mixed anxiety and depressed mood 01/13/2014  . Seizures ([facility]) 01/12/2014  . Elevated liver enzymes 01/12/2014  . Alcohol dependence ([facility]) 01/11/2014    History reviewed. No pertinent surgical history.      Home Medications    Prior to Admission medications   Medication Sig Start Date End Date Taking? Authorizing Provider  gabapentin (NEURONTIN) 100 MG capsule Take 1 capsule (100 mg total) by mouth 3 (three) times daily. 06/24/17   Massie Maroon, FNP  ibuprofen (ADVIL,MOTRIN) 600 MG tablet Take 1 tablet (600 mg total) by mouth every 6  (six) hours as needed. 06/12/18   Fayrene Helper, PA-C  levETIRAcetam (KEPPRA) 500 MG tablet Take 2 tablets (1,000 mg total) by mouth 2 (two) times daily for 14 days. 08/08/18 08/22/18  Claude Manges, PA-C  penicillin v potassium (VEETID) 500 MG tablet Take 1 tablet (500 mg total) by mouth 3 (three) times daily. 06/12/18   Fayrene Helper, PA-C    Family History Family History  Problem Relation Age of Onset  . Diabetes Mother   . Hypertension Mother     Social History Social History   Tobacco Use  . Smoking status: Current Every Day Smoker    Packs/day: 0.25    Years: 4.00    Pack years: 1.00    Types: Cigarettes  . Smokeless tobacco: Former Engineer, water Use Topics  . Alcohol use: Yes    Comment: every once in a while   . Drug use: No     Allergies   Patient has no known allergies.   Review of Systems Review of Systems  Constitutional: Negative for chills and fever.  HENT: Negative for rhinorrhea and sore throat.   Respiratory: Negative for chest tightness and shortness of breath.   Cardiovascular: Negative for chest pain and palpitations.  Gastrointestinal: Negative for abdominal pain, diarrhea, nausea and vomiting.  Genitourinary: Negative for dysuria and flank pain.  Musculoskeletal: Negative for back pain.  Skin: Negative for pallor and wound.  Neurological: Positive for seizures.  All other systems reviewed and are negative.    Physical Exam Updated Vital Signs BP 121/77   Pulse 71   Temp 98 F (36.7 C) (Oral)   Resp 16   Ht 5\' 11"  (1.803 m)   Wt 80 kg   SpO2 98%   BMI 24.60 kg/m   Physical Exam  Constitutional: He is oriented to person, place, and time. He appears well-developed and well-nourished.  HENT:  Head: Normocephalic and atraumatic.  Eyes: Pupils are equal, round, and reactive to light.  Neck: Normal range of motion. Neck supple.  Cardiovascular: Normal heart sounds.  Pulmonary/Chest: Effort normal. He has no wheezes.  Abdominal: Soft. There  is no tenderness.  Neurological: He is alert and oriented to person, place, and time.  Alert, oriented, thought content appropriate. Speech fluent without evidence of aphasia. Able to follow 2 step commands without difficulty.  Cranial Nerves:  II:  Peripheral visual fields grossly normal, pupils, round, reactive to light III,IV, VI: ptosis noted to right eye baseline per patient,extra-ocular motions intact bilaterally  V,VII: smile symmetric, facial light touch sensation equal VIII: hearing grossly normal bilaterally  IX,X: midline uvula rise  XI: bilateral shoulder shrug equal and strong XII: midline tongue extension  Motor:  5/5 in upper and lower extremities bilaterally including strong and equal grip strength and dorsiflexion/plantar flexion Sensory: light touch normal in all extremities.  Cerebellar: normal finger-to-nose with bilateral upper extremities, pronator drift negative Gait: normal gait and balance   Skin: Skin is warm and dry.  Nursing note and vitals reviewed.    ED Treatments / Results  Labs (all labs ordered are listed, but only abnormal results are displayed) Labs Reviewed  CBC WITH DIFFERENTIAL/PLATELET - Abnormal; Notable for the following components:      Result Value   WBC 15.0 (*)    Neutro Abs 10.4 (*)    Monocytes Absolute 1.4 (*)    All other components within normal limits  COMPREHENSIVE METABOLIC PANEL - Abnormal; Notable for the following components:   Glucose, Bld 124 (*)    All other components within normal limits  URINALYSIS, ROUTINE W REFLEX MICROSCOPIC  RAPID URINE DRUG SCREEN, HOSP PERFORMED    EKG None  Radiology No results found.  Procedures Procedures (including critical care time)  Medications Ordered in ED Medications  levETIRAcetam (KEPPRA) IVPB 1000 mg/100 mL premix (0 mg Intravenous Stopped 08/08/18 1954)     Initial Impression / Assessment and Plan / ED Course  I have reviewed the triage vital signs and the nursing  notes.  Pertinent labs & imaging results that were available during my care of the patient were reviewed by me and considered in my medical decision making (see chart for details).   He presents in the ED with a "seizure" witnessed by his brother.  Patient reports he has not been taking his Keppra in the past 5 days.  He denies any headache, chest pain, shortness of breath. Work-up in the ED has been unremarkable EMP shows no electrolyte abnormality, CBC shows slight increase in his white blood cell count.  Patient denies any cough or URI symptoms.  I provided him with a loading dose of Keppra over thousand milligrams.  Vision states he has no complaints at this time and he feels up to go home but will need a prescription for his Keppra.  I have advised patient that I will provide this prescription to him but he needs to get it filled and have an appointment with his  PCP as he needs to further manage his seizure disorder.  Patient understands and agrees with plan.  Vitals stable for discharge, patient stable for discharge.  Return precautions provided. Final Clinical Impressions(s) / ED Diagnoses   Final diagnoses:  Seizure disorder [facility])    ED Discharge Orders         Ordered    levETIRAcetam (KEPPRA) 500 MG tablet  2 times daily     08/08/18 2150           Claude Manges, PA-C 08/08/18 2154    Gwyneth Sprout, MD 08/08/18 2237

## 2018-08-08 NOTE — ED Notes (Signed)
Pt stable, ambulatory, states understanding of discharge instructions 

## 2018-08-08 NOTE — Discharge Instructions (Signed)
I have provided a prescription for your Keppra, please have this prescription filled and take medication properly. Please schedule an appointment with your primary care physician in order to seek further management of your seizures.

## 2018-08-08 NOTE — ED Triage Notes (Addendum)
States out of seizure medication (Keppra) x 5 days.  States he is unable to get medicine refilled because he "can't read numbers on bottle".  Took it to [facility] and they were unable to refill it and didn't have any Rx on file.  States he doesn't have a PCP and last got Rx from ED.  States his brother told him he had a seizure today.

## 2019-10-30 ENCOUNTER — Other Ambulatory Visit: Payer: Self-pay

## 2019-10-30 ENCOUNTER — Emergency Department (HOSPITAL_COMMUNITY)
Admission: EM | Admit: 2019-10-30 | Discharge: 2019-10-30 | Disposition: A | Discharge status: Home / Self Care | Payer: Self-pay | Attending: Emergency Medicine | Admitting: Emergency Medicine

## 2019-10-30 ENCOUNTER — Emergency Department (HOSPITAL_COMMUNITY): Payer: Self-pay

## 2019-10-30 ENCOUNTER — Encounter (HOSPITAL_COMMUNITY): Payer: Self-pay | Admitting: Emergency Medicine

## 2019-10-30 DIAGNOSIS — S41141A Puncture wound with foreign body of right upper arm, initial encounter: Secondary | ICD-10-CM | POA: Insufficient documentation

## 2019-10-30 DIAGNOSIS — Y939 Activity, unspecified: Secondary | ICD-10-CM | POA: Insufficient documentation

## 2019-10-30 DIAGNOSIS — W3400XA Accidental discharge from unspecified firearms or gun, initial encounter: Secondary | ICD-10-CM | POA: Insufficient documentation

## 2019-10-30 DIAGNOSIS — D696 Thrombocytopenia, unspecified: Secondary | ICD-10-CM

## 2019-10-30 DIAGNOSIS — Y929 Unspecified place or not applicable: Secondary | ICD-10-CM | POA: Insufficient documentation

## 2019-10-30 DIAGNOSIS — F17210 Nicotine dependence, cigarettes, uncomplicated: Secondary | ICD-10-CM | POA: Insufficient documentation

## 2019-10-30 DIAGNOSIS — Y999 Unspecified external cause status: Secondary | ICD-10-CM | POA: Insufficient documentation

## 2019-10-30 LAB — CBC WITH DIFFERENTIAL/PLATELET
Abs Immature Granulocytes: 0.04 10*3/uL (ref 0.00–0.07)
Basophils Absolute: 0.1 10*3/uL (ref 0.0–0.1)
Basophils Relative: 1 %
Eosinophils Absolute: 0.3 10*3/uL (ref 0.0–0.5)
Eosinophils Relative: 3 %
HCT: 42.3 % (ref 39.0–52.0)
Hemoglobin: 14.8 g/dL (ref 13.0–17.0)
Immature Granulocytes: 1 %
Lymphocytes Relative: 9 %
Lymphs Abs: 0.8 10*3/uL (ref 0.7–4.0)
MCH: 33.8 pg (ref 26.0–34.0)
MCHC: 35 g/dL (ref 30.0–36.0)
MCV: 96.6 fL (ref 80.0–100.0)
Monocytes Absolute: 1 10*3/uL (ref 0.1–1.0)
Monocytes Relative: 11 %
Neutro Abs: 6.5 10*3/uL (ref 1.7–7.7)
Neutrophils Relative %: 75 %
Platelets: 98 10*3/uL — ABNORMAL LOW (ref 150–400)
RBC: 4.38 MIL/uL (ref 4.22–5.81)
RDW: 14.2 % (ref 11.5–15.5)
WBC: 8.6 10*3/uL (ref 4.0–10.5)
nRBC: 0 % (ref 0.0–0.2)

## 2019-10-30 LAB — RAPID URINE DRUG SCREEN, HOSP PERFORMED
Amphetamines: NOT DETECTED
Barbiturates: NOT DETECTED
Benzodiazepines: NOT DETECTED
Cocaine: POSITIVE — AB
Opiates: POSITIVE — AB
Tetrahydrocannabinol: NOT DETECTED

## 2019-10-30 LAB — BASIC METABOLIC PANEL
Anion gap: 19 — ABNORMAL HIGH (ref 5–15)
BUN: 8 mg/dL (ref 6–20)
CO2: 22 mmol/L (ref 22–32)
Calcium: 9.1 mg/dL (ref 8.9–10.3)
Chloride: 96 mmol/L — ABNORMAL LOW (ref 98–111)
Creatinine, Ser: 0.89 mg/dL (ref 0.61–1.24)
GFR calc Af Amer: >60 mL/min (ref >60)
GFR calc non Af Amer: >60 mL/min (ref >60)
Glucose, Bld: 102 mg/dL — ABNORMAL HIGH (ref 70–99)
Potassium: 4.2 mmol/L (ref 3.5–5.1)
Sodium: 137 mmol/L (ref 135–145)

## 2019-10-30 LAB — ETHANOL: Alcohol, Ethyl (B): 403 mg/dL (ref <10)

## 2019-10-30 MED ORDER — ONDANSETRON HCL 4 MG/2ML IJ SOLN
4.0000 mg | Freq: Once | INTRAMUSCULAR | Status: AC
  Administered 2019-10-30: 17:00:00 4 mg via INTRAVENOUS
  Filled 2019-10-30: qty 2

## 2019-10-30 MED ORDER — MORPHINE SULFATE (PF) 4 MG/ML IV SOLN
4.0000 mg | Freq: Once | INTRAVENOUS | Status: AC
  Administered 2019-10-30: 17:00:00 4 mg via INTRAVENOUS
  Filled 2019-10-30: qty 1

## 2019-10-30 MED ORDER — THIAMINE HCL 100 MG/ML IJ SOLN
100.0000 mg | Freq: Every day | INTRAMUSCULAR | Status: DC
  Administered 2019-10-30: 17:00:00 100 mg via INTRAVENOUS
  Filled 2019-10-30: qty 2

## 2019-10-30 MED ORDER — LORAZEPAM 1 MG PO TABS: 0.0000 mg | ORAL_TABLET | Freq: Four times a day (QID) | ORAL | Status: DC

## 2019-10-30 MED ORDER — LIDOCAINE 5 % EX PTCH: 1.0000 | MEDICATED_PATCH | CUTANEOUS | 0 refills | Status: DC

## 2019-10-30 MED ORDER — LORAZEPAM 2 MG/ML IJ SOLN: 0.0000 mg | Freq: Two times a day (BID) | INTRAMUSCULAR | Status: DC

## 2019-10-30 MED ORDER — LORAZEPAM 1 MG PO TABS: 0.0000 mg | ORAL_TABLET | Freq: Two times a day (BID) | ORAL | Status: DC

## 2019-10-30 MED ORDER — THIAMINE HCL 100 MG PO TABS: 100.0000 mg | ORAL_TABLET | Freq: Every day | ORAL | Status: DC

## 2019-10-30 MED ORDER — CEPHALEXIN 500 MG PO CAPS: 500.0000 mg | ORAL_CAPSULE | Freq: Four times a day (QID) | ORAL | 0 refills | Status: DC

## 2019-10-30 MED ORDER — SODIUM CHLORIDE 0.9 % IV BOLUS
1000.0000 mL | Freq: Once | INTRAVENOUS | Status: AC
  Administered 2019-10-30: 17:00:00 1000 mL via INTRAVENOUS

## 2019-10-30 MED ORDER — CEPHALEXIN 250 MG PO CAPS
500.0000 mg | ORAL_CAPSULE | Freq: Once | ORAL | Status: AC
  Administered 2019-10-30: 21:00:00 500 mg via ORAL
  Filled 2019-10-30: qty 2

## 2019-10-30 MED ORDER — LORAZEPAM 2 MG/ML IJ SOLN
0.0000 mg | Freq: Four times a day (QID) | INTRAMUSCULAR | Status: DC
  Administered 2019-10-30: 17:00:00 1 mg via INTRAVENOUS
  Filled 2019-10-30: qty 1

## 2019-10-30 NOTE — ED Notes (Signed)
Patient transported to X-ray 
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
## 2019-10-30 NOTE — ED Notes (Signed)
Received report from Kayla RN

## 2019-10-30 NOTE — ED Notes (Signed)
[employer] officer at bedside 

## 2019-10-30 NOTE — ED Notes (Signed)
Clydie Braun - RN aware of pt's vitals.

## 2019-10-30 NOTE — ED Notes (Signed)
Pt ambulated to restroom with a slow, steady gait, RN on standby but required no assistance from RN. MD aware.

## 2019-10-30 NOTE — Discharge Instructions (Addendum)
Expect your soreness to increase over the next 2-3 days. Take it easy, but do not lay around too much as this may make any stiffness worse.  Antiinflammatory medications: Take 600 mg of ibuprofen every 6 hours or 440 mg (over the counter dose) to 500 mg (prescription dose) of naproxen every 12 hours for the next 3 days. After this time, these medications may be used as needed for pain. Take these medications with food to avoid upset stomach. Choose only one of these medications, do not take them together. Acetaminophen (generic for Tylenol): Should you continue to have additional pain while taking the ibuprofen or naproxen, you may add in acetaminophen as needed. Your daily total maximum amount of acetaminophen from all sources should be limited to 4000mg /day for persons without liver problems, or 2000mg /day for those with liver problems. Please take all of your antibiotics until finished!   You may develop abdominal discomfort or diarrhea from the antibiotic.  You may help offset this with probiotics which you can buy or get in yogurt. Do not eat or take the probiotics until 2 hours after your antibiotic.  Lidocaine patches: These are available via either prescription or over-the-counter. The over-the-counter option may be more economical one and are likely just as effective. There are multiple over-the-counter brands, such as Salonpas. Ice: May apply ice to the area over the next 24 hours for 15 minutes at a time to reduce pain, inflammation, and swelling, if present. Follow up: May follow up with the orthopedic specialist should you wish to pursue removal. Return: Return to the ED as needed.  For prescription assistance, may try using prescription discount sites or apps, such as goodrx.com  Low platelets: Your platelets were noted to be lower than normal.  These will need to be followed up upon by a primary care provider.  Typically, follow-up starts with retesting this value to assure accuracy.

## 2019-10-31 MED FILL — LIDOCAINE 5 % PTCH: 5 | 30 days supply | Qty: 30 | Fill #0

## 2019-10-31 MED FILL — CEPHALEXIN 500 MG CAPSULE: 500 | 5 days supply | Qty: 20 | Fill #0

## 2019-11-01 ENCOUNTER — Emergency Department (HOSPITAL_COMMUNITY)
Admission: EM | Admit: 2019-11-01 | Discharge: 2019-11-02 | Disposition: A | Discharge status: Home / Self Care | Payer: Self-pay | Attending: Emergency Medicine | Admitting: Emergency Medicine

## 2019-11-01 ENCOUNTER — Emergency Department (HOSPITAL_COMMUNITY)
Admission: EM | Admit: 2019-11-01 | Discharge: 2019-11-01 | Disposition: A | Discharge status: Home / Self Care | Payer: Self-pay | Attending: Emergency Medicine | Admitting: Emergency Medicine

## 2019-11-01 ENCOUNTER — Encounter (HOSPITAL_COMMUNITY): Payer: Self-pay | Admitting: Emergency Medicine

## 2019-11-01 ENCOUNTER — Other Ambulatory Visit: Payer: Self-pay

## 2019-11-01 DIAGNOSIS — T148XXA Other injury of unspecified body region, initial encounter: Secondary | ICD-10-CM

## 2019-11-01 DIAGNOSIS — Y929 Unspecified place or not applicable: Secondary | ICD-10-CM | POA: Insufficient documentation

## 2019-11-01 DIAGNOSIS — S90821A Blister (nonthermal), right foot, initial encounter: Secondary | ICD-10-CM | POA: Insufficient documentation

## 2019-11-01 DIAGNOSIS — Y939 Activity, unspecified: Secondary | ICD-10-CM | POA: Insufficient documentation

## 2019-11-01 DIAGNOSIS — X58XXXA Exposure to other specified factors, initial encounter: Secondary | ICD-10-CM | POA: Insufficient documentation

## 2019-11-01 DIAGNOSIS — F17210 Nicotine dependence, cigarettes, uncomplicated: Secondary | ICD-10-CM | POA: Insufficient documentation

## 2019-11-01 DIAGNOSIS — S40021A Contusion of right upper arm, initial encounter: Secondary | ICD-10-CM | POA: Insufficient documentation

## 2019-11-01 DIAGNOSIS — Y9389 Activity, other specified: Secondary | ICD-10-CM | POA: Insufficient documentation

## 2019-11-01 DIAGNOSIS — Y999 Unspecified external cause status: Secondary | ICD-10-CM | POA: Insufficient documentation

## 2019-11-01 DIAGNOSIS — Y998 Other external cause status: Secondary | ICD-10-CM | POA: Insufficient documentation

## 2019-11-01 DIAGNOSIS — W3400XD Accidental discharge from unspecified firearms or gun, subsequent encounter: Secondary | ICD-10-CM | POA: Insufficient documentation

## 2019-11-01 DIAGNOSIS — S41131D Puncture wound without foreign body of right upper arm, subsequent encounter: Secondary | ICD-10-CM | POA: Insufficient documentation

## 2019-11-01 LAB — CBC WITH DIFFERENTIAL/PLATELET
Abs Immature Granulocytes: 0.02 10*3/uL (ref 0.00–0.07)
Basophils Absolute: 0.1 10*3/uL (ref 0.0–0.1)
Basophils Relative: 1 %
Eosinophils Absolute: 0 10*3/uL (ref 0.0–0.5)
Eosinophils Relative: 0 %
HCT: 38.5 % — ABNORMAL LOW (ref 39.0–52.0)
Hemoglobin: 13.6 g/dL (ref 13.0–17.0)
Immature Granulocytes: 0 %
Lymphocytes Relative: 18 %
Lymphs Abs: 1.2 10*3/uL (ref 0.7–4.0)
MCH: 33.5 pg (ref 26.0–34.0)
MCHC: 35.3 g/dL (ref 30.0–36.0)
MCV: 94.8 fL (ref 80.0–100.0)
Monocytes Absolute: 0.8 10*3/uL (ref 0.1–1.0)
Monocytes Relative: 11 %
Neutro Abs: 4.6 10*3/uL (ref 1.7–7.7)
Neutrophils Relative %: 70 %
Platelets: 91 10*3/uL — ABNORMAL LOW (ref 150–400)
RBC: 4.06 MIL/uL — ABNORMAL LOW (ref 4.22–5.81)
RDW: 13.9 % (ref 11.5–15.5)
WBC: 6.7 10*3/uL (ref 4.0–10.5)
nRBC: 0 % (ref 0.0–0.2)

## 2019-11-01 LAB — BASIC METABOLIC PANEL
Anion gap: 16 — ABNORMAL HIGH (ref 5–15)
BUN: 6 mg/dL (ref 6–20)
CO2: 23 mmol/L (ref 22–32)
Calcium: 8.7 mg/dL — ABNORMAL LOW (ref 8.9–10.3)
Chloride: 96 mmol/L — ABNORMAL LOW (ref 98–111)
Creatinine, Ser: 0.86 mg/dL (ref 0.61–1.24)
GFR calc Af Amer: >60 mL/min (ref >60)
GFR calc non Af Amer: >60 mL/min (ref >60)
Glucose, Bld: 94 mg/dL (ref 70–99)
Potassium: 3.8 mmol/L (ref 3.5–5.1)
Sodium: 135 mmol/L (ref 135–145)

## 2019-11-01 MED ORDER — IBUPROFEN 400 MG PO TABS
600.0000 mg | ORAL_TABLET | Freq: Once | ORAL | Status: AC
  Administered 2019-11-01: 600 mg via ORAL
  Filled 2019-11-01: qty 1

## 2019-11-01 MED ORDER — ACETAMINOPHEN 500 MG PO TABS
1000.0000 mg | ORAL_TABLET | Freq: Once | ORAL | Status: AC
  Administered 2019-11-01: 1000 mg via ORAL
  Filled 2019-11-01: qty 2

## 2019-11-01 NOTE — ED Notes (Signed)
RN went over dc instructions with the pt who verbalized understanding. Pt was alert and no distress was noted when ambulated to exit.

## 2019-11-01 NOTE — Discharge Instructions (Addendum)
Use ice, Tylenol and ibuprofen as needed for pain.  Watch for signs of infection such as fevers, shortness of breath, spreading redness, pus draining.  If you develop worsening swelling in the right arm you might need an ultrasound to look for blood clots.
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
## 2019-11-01 NOTE — ED Triage Notes (Signed)
Patient reports blister at right heel today , ambulatory /denies fever.

## 2019-11-02 MED ORDER — IBUPROFEN 400 MG PO TABS
400.0000 mg | ORAL_TABLET | Freq: Once | ORAL | Status: AC
  Administered 2019-11-02: 02:00:00 400 mg via ORAL
  Filled 2019-11-02: qty 1

## 2019-11-02 NOTE — Discharge Instructions (Addendum)
You have a blister.  Keep the area protected.  It will likely pop at some point.  You may apply antibiotic ointment after that.  Make sure that you are wearing well fitting shoes.

## 2019-11-02 NOTE — ED Provider Notes (Signed)
[facility] EMERGENCY DEPARTMENT Provider Note   CSN: [id number] Arrival date & time: 11/01/19  2305     History Chief Complaint  Patient presents with  . Blister    Right Heel    Dwayne Bolton is a 44 y.o. male.  HPI     This a 44 year old male who presents with a blister to the right foot.  Patient reports 2-day history of blister to the right heel.  He reports pain.  Rates his pain at 6 out of 10.  He has not done anything for his pain.  He does report wearing new shoes but feels that they fit well.  Has not noted any other skin changes.  Denies any injury.  Past Medical History:  Diagnosis Date  . Alcohol abuse   . Seizure [facility])     Patient Active Problem List   Diagnosis Date Noted  . Tobacco dependence 12/31/2015  . Seizure disorder (HCC) 12/31/2015  . Convulsion (HCC)   . Alcohol withdrawal seizure (HCC) 03/22/2015  . GAD (generalized anxiety disorder) 01/13/2014  . Adjustment disorder with mixed anxiety and depressed mood 01/13/2014  . Seizures (HCC) 01/12/2014  . Elevated liver enzymes 01/12/2014  . Alcohol dependence (HCC) 01/11/2014    History reviewed. No pertinent surgical history.     Family History  Problem Relation Age of Onset  . Diabetes Mother   . Hypertension Mother     Social History   Tobacco Use  . Smoking status: Current Every Day Smoker    Packs/day: 0.25    Years: 4.00    Pack years: 1.00    Types: Cigarettes  . Smokeless tobacco: Former Engineer, water Use Topics  . Alcohol use: Yes    Comment: every once in a while   . Drug use: No    Home Medications Prior to Admission medications   Medication Sig Start Date End Date Taking? Authorizing Provider  cephALEXin (KEFLEX) 500 MG capsule Take 1 capsule (500 mg total) by mouth 4 (four) times daily. 10/30/19   Joy, Shawn C, PA-C  lidocaine (LIDODERM) 5 % Place 1 patch onto the skin daily. Remove & Discard patch within 12 hours or as directed by MD 10/30/19    Anselm Pancoast, PA-C    Allergies    Patient has no known allergies.  Review of Systems   Review of Systems  Constitutional: Negative for fever.  Skin:       Blister  All other systems reviewed and are negative.   Physical Exam Updated Vital Signs BP (!) 149/49 (BP Location: Right Arm)   Pulse 88   Temp 98.9 F (37.2 C) (Oral)   Resp 16   SpO2 97%   Physical Exam Vitals and nursing note reviewed.  Constitutional:      Appearance: He is well-developed.     Comments: Disheveled appearing but nontoxic  HENT:     Head: Normocephalic and atraumatic.  Cardiovascular:     Rate and Rhythm: Normal rate and regular rhythm.  Pulmonary:     Effort: Pulmonary effort is normal. No respiratory distress.  Musculoskeletal:        General: No tenderness or deformity.     Cervical back: Neck supple.  Lymphadenopathy:     Cervical: No cervical adenopathy.  Skin:    General: Skin is warm and dry.     Comments: Blood-filled blister noted over the lateral aspect of the right heel, no adjacent erythema, skin intact  Neurological:  Mental Status: He is alert and oriented to person, place, and time.  Psychiatric:        Mood and Affect: Mood normal.     ED Results / Procedures / Treatments   Labs (all labs ordered are listed, but only abnormal results are displayed) Labs Reviewed - No data to display  EKG None  Radiology No results found.  Procedures Procedures (including critical care time)  Medications Ordered in ED Medications - No data to display  ED Course  I have reviewed the triage vital signs and the nursing notes.  Pertinent labs & imaging results that were available during my care of the patient were reviewed by me and considered in my medical decision making (see chart for details).    MDM Rules/Calculators/A&P                       Patient presents with a blister of the right heel.  Likely related to new shoes.  No adjacent skin findings suggestive of  infection.  He is not a diabetic.  Recommend supportive measures.  Advised that the blister would likely pop and then he can use antibiotic ointment.  He needs to make sure the area stays well padded and he is wearing good fitting shoes.  After history, exam, and medical workup I feel the patient has been appropriately medically screened and is safe for discharge home. Pertinent diagnoses were discussed with the patient. Patient was given return precautions.   Final Clinical Impression(s) / ED Diagnoses Final diagnoses:  Blister    Rx / DC Orders ED Discharge Orders    None       Delanna Blacketer, Barbette Hair, MD 11/02/19 0120

## 2020-01-20 ENCOUNTER — Emergency Department (HOSPITAL_COMMUNITY)
Admission: EM | Admit: 2020-01-20 | Discharge: 2020-01-20 | Disposition: A | Discharge status: Home / Self Care | Payer: Self-pay | Attending: Emergency Medicine | Admitting: Emergency Medicine

## 2020-01-20 ENCOUNTER — Encounter (HOSPITAL_COMMUNITY): Payer: Self-pay | Admitting: Emergency Medicine

## 2020-01-20 DIAGNOSIS — M549 Dorsalgia, unspecified: Secondary | ICD-10-CM | POA: Insufficient documentation

## 2020-01-20 DIAGNOSIS — F17210 Nicotine dependence, cigarettes, uncomplicated: Secondary | ICD-10-CM | POA: Insufficient documentation

## 2020-01-20 DIAGNOSIS — M545 Low back pain, unspecified: Secondary | ICD-10-CM

## 2020-01-20 MED ORDER — LIDOCAINE 5 % EX PTCH: 1.0000 | MEDICATED_PATCH | CUTANEOUS | 0 refills | Status: DC

## 2020-01-20 MED ORDER — KETOROLAC TROMETHAMINE 60 MG/2ML IM SOLN
30.0000 mg | Freq: Once | INTRAMUSCULAR | Status: AC
  Administered 2020-01-20: 04:00:00 30 mg via INTRAMUSCULAR
  Filled 2020-01-20: qty 2

## 2020-01-20 MED ORDER — ACETAMINOPHEN 500 MG PO TABS
1000.0000 mg | ORAL_TABLET | Freq: Once | ORAL | Status: AC
  Administered 2020-01-20: 04:00:00 1000 mg via ORAL
  Filled 2020-01-20: qty 2

## 2020-01-20 MED ORDER — METHOCARBAMOL 1000 MG/10ML IJ SOLN
1000.0000 mg | Freq: Once | INTRAMUSCULAR | Status: AC
  Administered 2020-01-20: 1000 mg via INTRAMUSCULAR
  Filled 2020-01-20: qty 10

## 2020-01-20 MED ORDER — METHOCARBAMOL 500 MG PO TABS: 500.0000 mg | ORAL_TABLET | Freq: Two times a day (BID) | ORAL | 0 refills | Status: DC

## 2020-01-20 NOTE — ED Triage Notes (Signed)
Pt states he hurt his back at work earlier today. Pt appears intoxicated. States he drank 1 beer PTA.

## 2020-01-20 NOTE — ED Provider Notes (Signed)
[facility] EMERGENCY DEPARTMENT Provider Note   CSN: [id number] Arrival date & time: 01/20/20  0049     History Chief Complaint  Patient presents with  . Back Pain    Dwayne Bolton is a 44 y.o. male.   Back Pain Location:  Generalized Quality:  Aching Radiates to:  Does not radiate Pain severity:  Mild Timing:  Constant Chronicity:  New Relieved by:  None tried Worsened by:  Nothing Ineffective treatments:  None tried Associated symptoms: no abdominal pain, no leg pain, no pelvic pain and no tingling        Past Medical History:  Diagnosis Date  . Alcohol abuse   . Seizure [facility])     Patient Active Problem List   Diagnosis Date Noted  . Tobacco dependence 12/31/2015  . Seizure disorder (HCC) 12/31/2015  . Convulsion (HCC)   . Alcohol withdrawal seizure (HCC) 03/22/2015  . GAD (generalized anxiety disorder) 01/13/2014  . Adjustment disorder with mixed anxiety and depressed mood 01/13/2014  . Seizures (HCC) 01/12/2014  . Elevated liver enzymes 01/12/2014  . Alcohol dependence (HCC) 01/11/2014    History reviewed. No pertinent surgical history.     Family History  Problem Relation Age of Onset  . Diabetes Mother   . Hypertension Mother     Social History   Tobacco Use  . Smoking status: Current Every Day Smoker    Packs/day: 0.25    Years: 4.00    Pack years: 1.00    Types: Cigarettes  . Smokeless tobacco: Former Engineer, water Use Topics  . Alcohol use: Yes  . Drug use: No    Home Medications Prior to Admission medications   Medication Sig Start Date End Date Taking? Authorizing Provider  lidocaine (LIDODERM) 5 % Place 1 patch onto the skin daily. Remove & Discard patch within 12 hours or as directed by MD 01/20/20   Mell Mellott, Barbara Cower, MD  methocarbamol (ROBAXIN) 500 MG tablet Take 1 tablet (500 mg total) by mouth 2 (two) times daily. 01/20/20   Petro Talent, Barbara Cower, MD    Allergies    Patient has no known allergies.  Review of  Systems   Review of Systems  Gastrointestinal: Negative for abdominal pain.  Genitourinary: Negative for pelvic pain.  Musculoskeletal: Positive for back pain.  Neurological: Negative for tingling.  All other systems reviewed and are negative.   Physical Exam Updated Vital Signs BP 121/86   Pulse 82   Temp 97.8 F (36.6 C) (Oral)   Resp 18   SpO2 93%   Physical Exam Vitals and nursing note reviewed.  Constitutional:      Appearance: He is well-developed.  HENT:     Head: Normocephalic and atraumatic.     Mouth/Throat:     Mouth: Mucous membranes are dry.     Pharynx: Oropharynx is clear.  Eyes:     Conjunctiva/sclera: Conjunctivae normal.  Cardiovascular:     Rate and Rhythm: Normal rate.  Pulmonary:     Effort: Pulmonary effort is normal. No respiratory distress.  Abdominal:     General: There is no distension.  Musculoskeletal:        General: Normal range of motion.     Cervical back: Normal range of motion.  Skin:    General: Skin is warm and dry.  Neurological:     General: No focal deficit present.     Mental Status: He is alert.     ED Results / Procedures / Treatments  Labs (all labs ordered are listed, but only abnormal results are displayed) Labs Reviewed - No data to display  EKG None  Radiology No results found.  Procedures Procedures (including critical care time)  Medications Ordered in ED Medications  ketorolac (TORADOL) injection 30 mg (30 mg Intramuscular Given 01/20/20 0416)  methocarbamol (ROBAXIN) injection 1,000 mg (1,000 mg Intramuscular Given 01/20/20 0508)  acetaminophen (TYLENOL) tablet 1,000 mg (1,000 mg Oral Given 01/20/20 0414)    ED Course  I have reviewed the triage vital signs and the nursing notes.  Pertinent labs & imaging results that were available during my care of the patient were reviewed by me and considered in my medical decision making (see chart for details).    MDM Rules/Calculators/A&P                        Back pain without red flags. Stable for discharge.   Final Clinical Impression(s) / ED Diagnoses Final diagnoses:  Acute bilateral low back pain without sciatica    Rx / DC Orders ED Discharge Orders         Ordered    lidocaine (LIDODERM) 5 %  Every 24 hours     01/20/20 0553    methocarbamol (ROBAXIN) 500 MG tablet  2 times daily     01/20/20 0553           Caelan Branden, Corene Cornea, MD 01/20/20 [phone]

## 2020-01-28 ENCOUNTER — Emergency Department (HOSPITAL_COMMUNITY)
Admission: EM | Admit: 2020-01-28 | Discharge: 2020-01-29 | Disposition: A | Discharge status: Home / Self Care | Payer: Self-pay | Attending: Emergency Medicine | Admitting: Emergency Medicine

## 2020-01-28 ENCOUNTER — Encounter (HOSPITAL_COMMUNITY): Payer: Self-pay | Admitting: *Deleted

## 2020-01-28 ENCOUNTER — Other Ambulatory Visit: Payer: Self-pay

## 2020-01-28 DIAGNOSIS — F17210 Nicotine dependence, cigarettes, uncomplicated: Secondary | ICD-10-CM | POA: Insufficient documentation

## 2020-01-28 DIAGNOSIS — F10220 Alcohol dependence with intoxication, uncomplicated: Secondary | ICD-10-CM | POA: Insufficient documentation

## 2020-01-28 DIAGNOSIS — F10920 Alcohol use, unspecified with intoxication, uncomplicated: Secondary | ICD-10-CM

## 2020-01-28 MED ORDER — THIAMINE HCL 100 MG/ML IJ SOLN
100.0000 mg | Freq: Once | INTRAMUSCULAR | Status: AC
  Administered 2020-01-28: 100 mg via INTRAVENOUS
  Filled 2020-01-28: qty 2

## 2020-01-28 MED ORDER — SODIUM CHLORIDE 0.9 % IV BOLUS
1000.0000 mL | Freq: Once | INTRAVENOUS | Status: AC
  Administered 2020-01-28: 1000 mL via INTRAVENOUS

## 2020-01-28 NOTE — ED Triage Notes (Signed)
[place] gas station, called by bystander because he was " slumped over'. Pt admits to ETOH tonight.  Sitting up mumbling on arrival by EMS, several bottles of alcohol found on his person.  12 lead unremarkable  171/100, hr 101, r 96% 97.3, cbg 209. IV was established (left AC).

## 2020-01-28 NOTE — ED Provider Notes (Signed)
[facility]-EMERGENCY DEPT Provider Note   CSN: [id number] Arrival date & time: 01/28/20  2302     History Chief Complaint  Patient presents with  . Alcohol Intoxication    LEVEL 5 CAVEAT 2/2 INTOXICATION  Dwayne Bolton is a 44 y.o. male.  44 year old male with a history of alcohol abuse and seizure disorder presents to the emergency department from a gas station.  Bystander called EMS when patient was found "slumped over".  Patient does endorse drinking heavily today.  Denies illicit drug use.  Was found with several bottles of alcohol on his person.  Only complaint is of sciatica which is a longstanding issue, per patient.  States that his sciatica will transition between the left and right sides of his low back.  No additional complaints at this time.  The history is provided by the patient. No language interpreter was used.  Alcohol Intoxication       Past Medical History:  Diagnosis Date  . Alcohol abuse   . Seizure [facility])     Patient Active Problem List   Diagnosis Date Noted  . Tobacco dependence 12/31/2015  . Seizure disorder (HCC) 12/31/2015  . Convulsion (HCC)   . Alcohol withdrawal seizure (HCC) 03/22/2015  . GAD (generalized anxiety disorder) 01/13/2014  . Adjustment disorder with mixed anxiety and depressed mood 01/13/2014  . Seizures (HCC) 01/12/2014  . Elevated liver enzymes 01/12/2014  . Alcohol dependence (HCC) 01/11/2014    History reviewed. No pertinent surgical history.     Family History  Problem Relation Age of Onset  . Diabetes Mother   . Hypertension Mother     Social History   Tobacco Use  . Smoking status: Current Every Day Smoker    Packs/day: 0.25    Years: 4.00    Pack years: 1.00    Types: Cigarettes  . Smokeless tobacco: Former Engineer, water Use Topics  . Alcohol use: Yes  . Drug use: No    Home Medications Prior to Admission medications   Medication Sig Start Date End Date Taking? Authorizing  Provider  lidocaine (LIDODERM) 5 % Place 1 patch onto the skin daily. Remove & Discard patch within 12 hours or as directed by MD 01/20/20   Mesner, Barbara Cower, MD  methocarbamol (ROBAXIN) 500 MG tablet Take 1 tablet (500 mg total) by mouth 2 (two) times daily. 01/20/20   Mesner, Barbara Cower, MD    Allergies    Patient has no known allergies.  Review of Systems   Review of Systems  Unable to perform ROS: Mental status change  Patient intoxicated   Physical Exam Updated Vital Signs BP 119/83   Pulse 93   Temp 97.8 F (36.6 C) (Oral)   Resp 16   SpO2 95%   Physical Exam Vitals and nursing note reviewed.  Constitutional:      General: He is not in acute distress.    Appearance: He is well-developed. He is not diaphoretic.     Comments: Disheveled. Slurred speech.  HENT:     Head: Normocephalic and atraumatic.  Eyes:     General: No scleral icterus.    Conjunctiva/sclera: Conjunctivae normal.  Cardiovascular:     Rate and Rhythm: Regular rhythm. Tachycardia present.     Pulses: Normal pulses.  Pulmonary:     Effort: Pulmonary effort is normal. No respiratory distress.     Comments: Respirations even and unlabored Musculoskeletal:        General: Normal range of motion.  Cervical back: Normal range of motion.  Skin:    General: Skin is warm and dry.     Coloration: Skin is not pale.     Findings: No erythema or rash.  Neurological:     Mental Status: He is alert and oriented to person, place, and time.     Coordination: Coordination normal.     Comments: GCS 15.  Speech is slurred.  Patient answers questions appropriately and follows commands.  Moving all extremities spontaneously.  Able to transition in the bed without assistance.  Psychiatric:        Behavior: Behavior normal.     ED Results / Procedures / Treatments   Labs (all labs ordered are listed, but only abnormal results are displayed) Labs Reviewed - No data to display  EKG None  Radiology No results  found.  Procedures Procedures (including critical care time)  Medications Ordered in ED Medications  sodium chloride 0.9 % bolus 1,000 mL (0 mLs Intravenous Stopped 01/29/20 0112)  thiamine (B-1) injection 100 mg (100 mg Intravenous Given 01/28/20 2353)    ED Course  I have reviewed the triage vital signs and the nursing notes.  Pertinent labs & imaging results that were available during my care of the patient were reviewed by me and considered in my medical decision making (see chart for details).  Clinical Course as of Jan 29 644  Sun Jan 29, 2020  0637 Patient ambulated in the ED without assistance. Gait steady. Tachycardia has resolved with IVF.   [KH]    Clinical Course User Index [KH] Beverely Pace   MDM Rules/Calculators/A&P                      44 year old male presents to the emergency department via EMS after they were called by a bystander that saw the patient slumped over.  Found with multiple bottles of alcohol on his person.  Clinically intoxicated on initial assessment in the ED.  He has been observed for a number of hours for sobering.  Is now much more alert and is able to ambulate independently in the department without difficulty.  Initially tachycardic which has resolved following IV fluid hydration.  Also given thiamine given longstanding history of alcohol abuse.  Patient to follow-up with a primary care doctor as needed.  Return precautions discussed and provided. Patient discharged in stable condition with no unaddressed concerns.   Final Clinical Impression(s) / ED Diagnoses Final diagnoses:  Alcoholic intoxication without complication [facility])    Rx / DC Orders ED Discharge Orders    None       Antonietta Breach, PA-C 01/29/20 6063    Rolland Porter, MD 01/29/20 [phone]

## 2020-02-07 ENCOUNTER — Emergency Department (HOSPITAL_COMMUNITY)
Admission: EM | Admit: 2020-02-07 | Discharge: 2020-02-07 | Disposition: A | Discharge status: Home / Self Care | Payer: Self-pay | Attending: Emergency Medicine | Admitting: Emergency Medicine

## 2020-02-07 ENCOUNTER — Encounter (HOSPITAL_COMMUNITY): Payer: Self-pay | Admitting: Emergency Medicine

## 2020-02-07 ENCOUNTER — Other Ambulatory Visit: Payer: Self-pay

## 2020-02-07 ENCOUNTER — Emergency Department (HOSPITAL_COMMUNITY): Payer: Self-pay

## 2020-02-07 DIAGNOSIS — F10920 Alcohol use, unspecified with intoxication, uncomplicated: Secondary | ICD-10-CM | POA: Insufficient documentation

## 2020-02-07 DIAGNOSIS — R569 Unspecified convulsions: Secondary | ICD-10-CM | POA: Insufficient documentation

## 2020-02-07 DIAGNOSIS — F17210 Nicotine dependence, cigarettes, uncomplicated: Secondary | ICD-10-CM | POA: Insufficient documentation

## 2020-02-07 DIAGNOSIS — Z79899 Other long term (current) drug therapy: Secondary | ICD-10-CM | POA: Insufficient documentation

## 2020-02-07 LAB — RAPID URINE DRUG SCREEN, HOSP PERFORMED
Amphetamines: NOT DETECTED
Barbiturates: NOT DETECTED
Benzodiazepines: NOT DETECTED
Cocaine: NOT DETECTED
Opiates: NOT DETECTED
Tetrahydrocannabinol: NOT DETECTED

## 2020-02-07 LAB — CBC WITH DIFFERENTIAL/PLATELET
Abs Immature Granulocytes: 0.02 10*3/uL (ref 0.00–0.07)
Basophils Absolute: 0.1 10*3/uL (ref 0.0–0.1)
Basophils Relative: 2 %
Eosinophils Absolute: 0 10*3/uL (ref 0.0–0.5)
Eosinophils Relative: 0 %
HCT: 40 % (ref 39.0–52.0)
Hemoglobin: 14.2 g/dL (ref 13.0–17.0)
Immature Granulocytes: 0 %
Lymphocytes Relative: 34 %
Lymphs Abs: 1.8 10*3/uL (ref 0.7–4.0)
MCH: 33.7 pg (ref 26.0–34.0)
MCHC: 35.5 g/dL (ref 30.0–36.0)
MCV: 95 fL (ref 80.0–100.0)
Monocytes Absolute: 0.8 10*3/uL (ref 0.1–1.0)
Monocytes Relative: 15 %
Neutro Abs: 2.6 10*3/uL (ref 1.7–7.7)
Neutrophils Relative %: 49 %
Platelets: 151 10*3/uL (ref 150–400)
RBC: 4.21 MIL/uL — ABNORMAL LOW (ref 4.22–5.81)
RDW: 11.8 % (ref 11.5–15.5)
WBC: 5.3 10*3/uL (ref 4.0–10.5)
nRBC: 0 % (ref 0.0–0.2)

## 2020-02-07 LAB — COMPREHENSIVE METABOLIC PANEL
ALT: 126 U/L — ABNORMAL HIGH (ref 0–44)
AST: 251 U/L — ABNORMAL HIGH (ref 15–41)
Albumin: 4.4 g/dL (ref 3.5–5.0)
Alkaline Phosphatase: 113 U/L (ref 38–126)
Anion gap: 12 (ref 5–15)
BUN: 8 mg/dL (ref 6–20)
CO2: 28 mmol/L (ref 22–32)
Calcium: 8.2 mg/dL — ABNORMAL LOW (ref 8.9–10.3)
Chloride: 100 mmol/L (ref 98–111)
Creatinine, Ser: 0.9 mg/dL (ref 0.61–1.24)
GFR calc Af Amer: >60 mL/min (ref >60)
GFR calc non Af Amer: >60 mL/min (ref >60)
Glucose, Bld: 113 mg/dL — ABNORMAL HIGH (ref 70–99)
Potassium: 3.8 mmol/L (ref 3.5–5.1)
Sodium: 140 mmol/L (ref 135–145)
Total Bilirubin: 0.8 mg/dL (ref 0.3–1.2)
Total Protein: 7.3 g/dL (ref 6.5–8.1)

## 2020-02-07 LAB — ACETAMINOPHEN LEVEL: Acetaminophen (Tylenol), Serum: <10 ug/mL — ABNORMAL LOW (ref 10–30)

## 2020-02-07 LAB — ETHANOL: Alcohol, Ethyl (B): 462 mg/dL (ref <10)

## 2020-02-07 LAB — SALICYLATE LEVEL: Salicylate Lvl: <7 mg/dL — ABNORMAL LOW (ref 7.0–30.0)

## 2020-02-07 MED ORDER — THIAMINE HCL 100 MG/ML IJ SOLN
100.0000 mg | Freq: Once | INTRAMUSCULAR | Status: AC
  Administered 2020-02-07: 100 mg via INTRAVENOUS
  Filled 2020-02-07: qty 2

## 2020-02-07 MED ORDER — SODIUM CHLORIDE 0.9 % IV BOLUS
1000.0000 mL | Freq: Once | INTRAVENOUS | Status: AC
  Administered 2020-02-07: 1000 mL via INTRAVENOUS

## 2020-02-07 MED ORDER — LEVETIRACETAM IN NACL 1000 MG/100ML IV SOLN
1000.0000 mg | Freq: Once | INTRAVENOUS | Status: AC
  Administered 2020-02-07: 1000 mg via INTRAVENOUS
  Filled 2020-02-07: qty 100

## 2020-02-07 MED ORDER — LEVETIRACETAM 500 MG PO TABS: 500.0000 mg | ORAL_TABLET | Freq: Two times a day (BID) | ORAL | 0 refills | Status: DC

## 2020-02-07 MED FILL — levETIRAcetam 500 MG TABS: 500 | 30 days supply | Qty: 60 | Fill #0

## 2020-02-07 NOTE — ED Provider Notes (Signed)
[facility]-EMERGENCY DEPT Provider Note   CSN: [id number] Arrival date & time: 02/07/20  0006     History Chief Complaint  Patient presents with  . Alcohol Intoxication  . Seizures    Dwayne Bolton is a 44 y.o. male.  Level 5 caveat for alcohol intoxication.  Patient brought in by EMS after questionable seizure-like activity and alcohol intoxication.  He is disheveled.  He admits to drinking alcohol today.  He states he "takes Ativan" for my seizures.  Chart review shows he has been on Keppra in the past but not for quite some time.  Patient complaining of low back pain which is chronic.  He is not certain if he hit his head.  He states he was incontinent of urine.  Did not bite his tongue.  Denies any headache, neck pain, chest pain or abdominal pain.  No shortness of breath, cough or fever.  Denies any other drug use.  The history is provided by the patient and the EMS personnel. The history is limited by the condition of the patient.  Alcohol Intoxication  Seizures      Past Medical History:  Diagnosis Date  . Alcohol abuse   . Seizure [facility])     Patient Active Problem List   Diagnosis Date Noted  . Tobacco dependence 12/31/2015  . Seizure disorder (HCC) 12/31/2015  . Convulsion (HCC)   . Alcohol withdrawal seizure (HCC) 03/22/2015  . GAD (generalized anxiety disorder) 01/13/2014  . Adjustment disorder with mixed anxiety and depressed mood 01/13/2014  . Seizures (HCC) 01/12/2014  . Elevated liver enzymes 01/12/2014  . Alcohol dependence (HCC) 01/11/2014    History reviewed. No pertinent surgical history.     Family History  Problem Relation Age of Onset  . Diabetes Mother   . Hypertension Mother     Social History   Tobacco Use  . Smoking status: Current Every Day Smoker    Packs/day: 0.25    Years: 4.00    Pack years: 1.00    Types: Cigarettes  . Smokeless tobacco: Former Engineer, water Use Topics  . Alcohol use: Yes  . Drug  use: No    Home Medications Prior to Admission medications   Medication Sig Start Date End Date Taking? Authorizing Provider  lidocaine (LIDODERM) 5 % Place 1 patch onto the skin daily. Remove & Discard patch within 12 hours or as directed by MD 01/20/20   Mesner, Barbara Cower, MD  methocarbamol (ROBAXIN) 500 MG tablet Take 1 tablet (500 mg total) by mouth 2 (two) times daily. 01/20/20   Mesner, Barbara Cower, MD    Allergies    Patient has no known allergies.  Review of Systems   Review of Systems  Unable to perform ROS: Mental status change  Neurological: Positive for seizures.    Physical Exam Updated Vital Signs BP (!) 134/92 (BP Location: Left Arm)   Pulse 84   Temp 97.9 F (36.6 C) (Oral)   Resp 15   SpO2 97%   Physical Exam Vitals and nursing note reviewed.  Constitutional:      General: He is not in acute distress.    Appearance: He is well-developed.     Comments: Intoxicated, disheveled  HENT:     Head: Normocephalic and atraumatic.     Mouth/Throat:     Pharynx: No oropharyngeal exudate.  Eyes:     Conjunctiva/sclera: Conjunctivae normal.     Pupils: Pupils are equal, round, and reactive to light.  Neck:  Comments: No C-spine tenderness Cardiovascular:     Rate and Rhythm: Normal rate and regular rhythm.     Heart sounds: Normal heart sounds. No murmur.  Pulmonary:     Effort: Pulmonary effort is normal. No respiratory distress.     Breath sounds: Normal breath sounds.  Abdominal:     Palpations: Abdomen is soft.     Tenderness: There is no abdominal tenderness. There is no guarding or rebound.  Musculoskeletal:        General: No tenderness. Normal range of motion.     Cervical back: Normal range of motion.     Comments: Paraspinal lumbar tenderness  5/5 strength in lower extremities  Skin:    General: Skin is warm.  Neurological:     Mental Status: He is alert and oriented to person, place, and time.     Cranial Nerves: No cranial nerve deficit.     Motor:  No abnormal muscle tone.     Coordination: Coordination normal.     Comments: No ataxia on finger to nose bilaterally. No pronator drift. 5/5 strength throughout. CN 2-12 intact.Equal grip strength. Sensation intact.   Psychiatric:        Behavior: Behavior normal.     ED Results / Procedures / Treatments   Labs (all labs ordered are listed, but only abnormal results are displayed) Labs Reviewed  CBC WITH DIFFERENTIAL/PLATELET - Abnormal; Notable for the following components:      Result Value   RBC 4.21 (*)    All other components within normal limits  COMPREHENSIVE METABOLIC PANEL - Abnormal; Notable for the following components:   Glucose, Bld 113 (*)    Calcium 8.2 (*)    AST 251 (*)    ALT 126 (*)    All other components within normal limits  ETHANOL - Abnormal; Notable for the following components:   Alcohol, Ethyl (B) 462 (*)    All other components within normal limits  ACETAMINOPHEN LEVEL - Abnormal; Notable for the following components:   Acetaminophen (Tylenol), Serum <10 (*)    All other components within normal limits  SALICYLATE LEVEL - Abnormal; Notable for the following components:   Salicylate Lvl <7.0 (*)    All other components within normal limits  RAPID URINE DRUG SCREEN, HOSP PERFORMED    EKG EKG Interpretation  Date/Time:  Tuesday February 07 2020 00:30:17 EDT Ventricular Rate:  94 PR Interval:    QRS Duration: 87 QT Interval:  352 QTC Calculation: 441 R Axis:   44 Text Interpretation: Sinus rhythm Anteroseptal infarct, old No significant change was found Confirmed by Glynn Octave [phone]) on 02/07/2020 12:45:14 AM   Radiology CT Head Wo Contrast  Result Date: 02/07/2020 CLINICAL DATA:  Encephalopathy. Seizure-like activity. History of seizures. EXAM: CT HEAD WITHOUT CONTRAST TECHNIQUE: Contiguous axial images were obtained from the base of the skull through the vertex without intravenous contrast. COMPARISON:  Head CT and brain MRI June 2016  FINDINGS: Brain: No intracranial hemorrhage, mass effect, or midline shift. Brain volume is normal for age. No hydrocephalus. The basilar cisterns are patent. No evidence of territorial infarct or acute ischemia. No extra-axial or intracranial fluid collection. Vascular: No hyperdense vessel or unexpected calcification. Skull: No fracture or focal lesion. Sinuses/Orbits: Mucosal thickening involving the ethmoid air cells and both frontal sinuses. No sinus fluid level. The mastoid air cells are clear. Orbits are unremarkable. Other: None. IMPRESSION: 1. No acute intracranial abnormality. 2. Mild paranasal sinus mucosal thickening. Electronically Signed  By: Keith Rake M.D.   On: 02/07/2020 02:04    Procedures Procedures (including critical care time)  Medications Ordered in ED Medications  levETIRAcetam (KEPPRA) IVPB 1000 mg/100 mL premix (has no administration in time range)  thiamine (B-1) injection 100 mg (has no administration in time range)  sodium chloride 0.9 % bolus 1,000 mL (1,000 mLs Intravenous New Bag/Given (Non-Interop) 02/07/20 0037)    ED Course  I have reviewed the triage vital signs and the nursing notes.  Pertinent labs & imaging results that were available during my care of the patient were reviewed by me and considered in my medical decision making (see chart for details).    MDM Rules/Calculators/A&P                     Alcohol intoxication with questionable seizure-like activity.  Patient was expelled alcohol intoxication.  Vitals are stable.  Patient is stable without evidence of head trauma. Disheveled.  Workup Shows alcohol intoxication with normal anion gap and normal electrolytes.  CT head is negative.  Patient remains obtunded but pain is protecting his airway.  He is loaded with IV Keppra as it appears he was supposed to be taking Keppra at some point in the past but is not compliant.  Patient remains obtunded and refuses to get up and ambulate.  He is  tolerating p.o.  He will be allowed to continue to sober in the ED.  He has no other evidence of trauma on him.  Care transferred at shift change pending sobriety for discharge.  Prescription for Keppra sent to his pharmacy.    Final Clinical Impression(s) / ED Diagnoses Final diagnoses:  Alcoholic intoxication without complication ([place])  Seizure-like activity [facility])    Rx / DC Orders ED Discharge Orders    None       Gaylia Kassel, Annie Main, MD 02/07/20 [phone]

## 2020-02-07 NOTE — ED Notes (Signed)
Patient ambulated with a steady gate. Patient denies SI and HI.

## 2020-02-07 NOTE — Discharge Instructions (Addendum)
Reduce your drinking. Take your seizure medications as prescribed. Followup with your doctor. Return to the ED if you develop new or worsening symptoms.

## 2020-02-07 NOTE — ED Notes (Signed)
Patient was given bus pass.

## 2020-02-07 NOTE — ED Notes (Signed)
Patient stated he drank a bottle of water. Bottle water is empty at bedside.

## 2020-02-07 NOTE — ED Notes (Signed)
Date and time results received: 02/07/20 1:39 AM   Test: ethanol  Critical Value: 462  Name of Provider Notified: Rancour, MD

## 2020-02-07 NOTE — ED Notes (Signed)
Pt transported to CT ?

## 2020-02-07 NOTE — ED Triage Notes (Signed)
Pt presents by [facility] for evaluation of ETOH and seizure activity that was witnessed by [facility]. Seizure lasted 2 min per PD. Pt was postictal with EMS.

## 2020-02-07 NOTE — ED Notes (Signed)
Pt given urinal and asked to provide urine sample.  

## 2020-02-07 NOTE — ED Notes (Signed)
Pt encouraged and able to take 1-2 sips of water. Tolerated well

## 2020-02-07 NOTE — ED Notes (Signed)
Pt reassessed for need to urinate but refuses

## 2020-02-07 NOTE — ED Notes (Signed)
This writer has attempted to get patient to ambulate x 3 . Patient refuses to get up from bed.

## 2020-02-07 NOTE — ED Provider Notes (Signed)
  Physical Exam  BP 107/63 (BP Location: Left Arm)   Pulse 89   Temp 97.9 F (36.6 C) (Oral)   Resp 11   SpO2 94%   Physical Exam  ED Course/Procedures     Procedures  MDM  Patient came in with questionable seizure activity.  Does have seizure history.  Has been on Keppra but not on it.  Also alcohol intoxication.  Reportedly had questions of suicidal statements.  Now states not suicidal.  Has been able to ambulate without difficulty.  Still mildly intoxicated but appears safe for discharge.       Benjiman Core, MD 02/07/20 [phone]

## 2020-02-07 NOTE — ED Notes (Signed)
Patient refused to drink water. Patient stated "I can't drink anymore".  Patient refused to ambulate for this Clinical research associate. Patient stated "I need to lay down".

## 2020-02-11 ENCOUNTER — Other Ambulatory Visit: Payer: Self-pay

## 2020-02-11 ENCOUNTER — Emergency Department (HOSPITAL_COMMUNITY)
Admission: EM | Admit: 2020-02-11 | Discharge: 2020-02-11 | Disposition: A | Discharge status: Home / Self Care | Payer: Self-pay | Attending: Emergency Medicine | Admitting: Emergency Medicine

## 2020-02-11 DIAGNOSIS — F17210 Nicotine dependence, cigarettes, uncomplicated: Secondary | ICD-10-CM | POA: Insufficient documentation

## 2020-02-11 DIAGNOSIS — F1010 Alcohol abuse, uncomplicated: Secondary | ICD-10-CM

## 2020-02-11 DIAGNOSIS — Z79899 Other long term (current) drug therapy: Secondary | ICD-10-CM | POA: Insufficient documentation

## 2020-02-11 DIAGNOSIS — F10129 Alcohol abuse with intoxication, unspecified: Secondary | ICD-10-CM | POA: Insufficient documentation

## 2020-02-11 DIAGNOSIS — Y908 Blood alcohol level of 240 mg/100 ml or more: Secondary | ICD-10-CM | POA: Insufficient documentation

## 2020-02-11 LAB — CBC WITH DIFFERENTIAL/PLATELET
Abs Immature Granulocytes: 0.02 10*3/uL (ref 0.00–0.07)
Basophils Absolute: 0.1 10*3/uL (ref 0.0–0.1)
Basophils Relative: 2 %
Eosinophils Absolute: 0 10*3/uL (ref 0.0–0.5)
Eosinophils Relative: 0 %
HCT: 38.2 % — ABNORMAL LOW (ref 39.0–52.0)
Hemoglobin: 13.5 g/dL (ref 13.0–17.0)
Immature Granulocytes: 0 %
Lymphocytes Relative: 23 %
Lymphs Abs: 1.1 10*3/uL (ref 0.7–4.0)
MCH: 33.3 pg (ref 26.0–34.0)
MCHC: 35.3 g/dL (ref 30.0–36.0)
MCV: 94.3 fL (ref 80.0–100.0)
Monocytes Absolute: 0.7 10*3/uL (ref 0.1–1.0)
Monocytes Relative: 14 %
Neutro Abs: 3 10*3/uL (ref 1.7–7.7)
Neutrophils Relative %: 61 %
Platelets: 127 10*3/uL — ABNORMAL LOW (ref 150–400)
RBC: 4.05 MIL/uL — ABNORMAL LOW (ref 4.22–5.81)
RDW: 12.3 % (ref 11.5–15.5)
WBC: 4.9 10*3/uL (ref 4.0–10.5)
nRBC: 0 % (ref 0.0–0.2)

## 2020-02-11 LAB — COMPREHENSIVE METABOLIC PANEL
ALT: 138 U/L — ABNORMAL HIGH (ref 0–44)
AST: 299 U/L — ABNORMAL HIGH (ref 15–41)
Albumin: 4.4 g/dL (ref 3.5–5.0)
Alkaline Phosphatase: 164 U/L — ABNORMAL HIGH (ref 38–126)
Anion gap: 17 — ABNORMAL HIGH (ref 5–15)
BUN: 10 mg/dL (ref 6–20)
CO2: 25 mmol/L (ref 22–32)
Calcium: 8.9 mg/dL (ref 8.9–10.3)
Chloride: 101 mmol/L (ref 98–111)
Creatinine, Ser: 0.9 mg/dL (ref 0.61–1.24)
GFR calc Af Amer: >60 mL/min (ref >60)
GFR calc non Af Amer: >60 mL/min (ref >60)
Glucose, Bld: 97 mg/dL (ref 70–99)
Potassium: 4.1 mmol/L (ref 3.5–5.1)
Sodium: 143 mmol/L (ref 135–145)
Total Bilirubin: 0.7 mg/dL (ref 0.3–1.2)
Total Protein: 7.4 g/dL (ref 6.5–8.1)

## 2020-02-11 LAB — SALICYLATE LEVEL: Salicylate Lvl: <7 mg/dL — ABNORMAL LOW (ref 7.0–30.0)

## 2020-02-11 LAB — ETHANOL: Alcohol, Ethyl (B): 504 mg/dL (ref <10)

## 2020-02-11 LAB — RAPID URINE DRUG SCREEN, HOSP PERFORMED
Amphetamines: NOT DETECTED
Barbiturates: NOT DETECTED
Benzodiazepines: NOT DETECTED
Cocaine: NOT DETECTED
Opiates: NOT DETECTED
Tetrahydrocannabinol: NOT DETECTED

## 2020-02-11 LAB — ACETAMINOPHEN LEVEL: Acetaminophen (Tylenol), Serum: <10 ug/mL — ABNORMAL LOW (ref 10–30)

## 2020-02-11 LAB — CBG MONITORING, ED: Glucose-Capillary: 98 mg/dL (ref 70–99)

## 2020-02-11 MED ORDER — THIAMINE HCL 100 MG PO TABS
100.0000 mg | ORAL_TABLET | Freq: Every day | ORAL | Status: DC
  Administered 2020-02-11: 100 mg via ORAL
  Filled 2020-02-11: qty 1

## 2020-02-11 MED ORDER — LORAZEPAM 1 MG PO TABS: 0.0000 mg | ORAL_TABLET | Freq: Two times a day (BID) | ORAL | Status: DC

## 2020-02-11 MED ORDER — LORAZEPAM 1 MG PO TABS: 0.0000 mg | ORAL_TABLET | Freq: Four times a day (QID) | ORAL | Status: DC

## 2020-02-11 MED ORDER — LORAZEPAM 2 MG/ML IJ SOLN: 0.0000 mg | Freq: Four times a day (QID) | INTRAMUSCULAR | Status: DC

## 2020-02-11 MED ORDER — LORAZEPAM 2 MG/ML IJ SOLN: 0.0000 mg | Freq: Two times a day (BID) | INTRAMUSCULAR | Status: DC

## 2020-02-11 MED ORDER — THIAMINE HCL 100 MG/ML IJ SOLN: 100.0000 mg | Freq: Every day | INTRAMUSCULAR | Status: DC

## 2020-02-11 NOTE — ED Notes (Addendum)
Pt ambulated with 2 assist.   Pt not able to keep eyes open.   Did not seem to be alert while ambulating.

## 2020-02-11 NOTE — ED Notes (Signed)
Increased HR when moving around in bed. After he settles, it will come back down to a normal range

## 2020-02-11 NOTE — ED Notes (Signed)
Patient ambulatory in hallway with steady gait. PA at bedside during ambulation.

## 2020-02-11 NOTE — ED Notes (Signed)
Date and time results received: 04/24/214:22 AM  Test: Alcohol Critical Value: 504  Name of Provider Notified: [place], MD  Orders Received? Or Actions Taken?:

## 2020-02-11 NOTE — ED Notes (Signed)
Pt ate a few bites then stated he felt nauseated. Pt tolerated taking Thiamine.

## 2020-02-11 NOTE — ED Triage Notes (Signed)
PER EMS: Pt is coming in for ETOH. Pt stole a beer from a store and was found in the bathroom drinking it. Staff alerted EMS. Pt is confused, intoxicated and has been drinking all day long. Pt states he had a seizure also. Prescribed keppra but ran out of his prescription.   GCS 13, confused to time.   BP 162/90 HR 116 RR 14 SPO2 94% CBG 88

## 2020-02-11 NOTE — ED Provider Notes (Signed)
[facility]-EMERGENCY DEPT Provider Note   CSN: [id number] Arrival date & time: 02/11/20  0144     History No chief complaint on file.   Marquez Ceesay is a 44 y.o. male.  Patient presents to the emergency department with a chief complaint of seizure.  He has history of seizure and alcohol abuse.  He states that he has been drinking all day long.  He states that he has been compliant with taking his Keppra.  The seizure was unwitnessed.  Additional history provided from RN and EMS.  Reportedly the patient stool some beer and was found drinking it in a convenient store bathroom.  EMS was called because the patient seemed confused and intoxicated.  The history is provided by the patient. No language interpreter was used.       Past Medical History:  Diagnosis Date  . Alcohol abuse   . Seizure [facility])     Patient Active Problem List   Diagnosis Date Noted  . Tobacco dependence 12/31/2015  . Seizure disorder (HCC) 12/31/2015  . Convulsion (HCC)   . Alcohol withdrawal seizure (HCC) 03/22/2015  . GAD (generalized anxiety disorder) 01/13/2014  . Adjustment disorder with mixed anxiety and depressed mood 01/13/2014  . Seizures (HCC) 01/12/2014  . Elevated liver enzymes 01/12/2014  . Alcohol dependence (HCC) 01/11/2014    No past surgical history on file.     Family History  Problem Relation Age of Onset  . Diabetes Mother   . Hypertension Mother     Social History   Tobacco Use  . Smoking status: Current Every Day Smoker    Packs/day: 0.25    Years: 4.00    Pack years: 1.00    Types: Cigarettes  . Smokeless tobacco: Former Engineer, water Use Topics  . Alcohol use: Yes  . Drug use: No    Home Medications Prior to Admission medications   Medication Sig Start Date End Date Taking? Authorizing Provider  levETIRAcetam (KEPPRA) 500 MG tablet Take 1 tablet (500 mg total) by mouth 2 (two) times daily. 02/07/20   Rancour, Jeannett Senior, MD  lidocaine  (LIDODERM) 5 % Place 1 patch onto the skin daily. Remove & Discard patch within 12 hours or as directed by MD 01/20/20   Mesner, Barbara Cower, MD  methocarbamol (ROBAXIN) 500 MG tablet Take 1 tablet (500 mg total) by mouth 2 (two) times daily. 01/20/20   Mesner, Barbara Cower, MD    Allergies    Patient has no known allergies.  Review of Systems   Review of Systems  All other systems reviewed and are negative.   Physical Exam Updated Vital Signs BP 135/89 (BP Location: Right Arm)   Pulse (!) 112   Temp 98.3 F (36.8 C) (Oral)   SpO2 97%   Physical Exam Vitals and nursing note reviewed.  Constitutional:      Appearance: He is well-developed.  HENT:     Head: Normocephalic and atraumatic.  Eyes:     Conjunctiva/sclera: Conjunctivae normal.  Cardiovascular:     Rate and Rhythm: Normal rate and regular rhythm.     Heart sounds: No murmur.  Pulmonary:     Effort: Pulmonary effort is normal. No respiratory distress.     Breath sounds: Normal breath sounds.  Abdominal:     Palpations: Abdomen is soft.     Tenderness: There is no abdominal tenderness.  Musculoskeletal:        General: Normal range of motion.     Cervical back: Neck  supple.  Skin:    General: Skin is warm and dry.  Neurological:     Mental Status: He is alert.     Comments: Intoxicated, but answers questions and responds to commands appropriately Alert and oriented  Psychiatric:        Mood and Affect: Mood normal.        Behavior: Behavior normal.     ED Results / Procedures / Treatments   Labs (all labs ordered are listed, but only abnormal results are displayed) Labs Reviewed  COMPREHENSIVE METABOLIC PANEL - Abnormal; Notable for the following components:      Result Value   AST 299 (*)    ALT 138 (*)    Alkaline Phosphatase 164 (*)    Anion gap 17 (*)    All other components within normal limits  SALICYLATE LEVEL - Abnormal; Notable for the following components:   Salicylate Lvl <7.7 (*)    All other  components within normal limits  ACETAMINOPHEN LEVEL - Abnormal; Notable for the following components:   Acetaminophen (Tylenol), Serum <10 (*)    All other components within normal limits  ETHANOL - Abnormal; Notable for the following components:   Alcohol, Ethyl (B) 504 (*)    All other components within normal limits  CBC WITH DIFFERENTIAL/PLATELET - Abnormal; Notable for the following components:   RBC 4.05 (*)    HCT 38.2 (*)    Platelets 127 (*)    All other components within normal limits  RAPID URINE DRUG SCREEN, HOSP PERFORMED  CBG MONITORING, ED    EKG None  Radiology No results found.  Procedures Procedures (including critical care time)  Medications Ordered in ED Medications  LORazepam (ATIVAN) injection 0-4 mg (has no administration in time range)    Or  LORazepam (ATIVAN) tablet 0-4 mg (has no administration in time range)  LORazepam (ATIVAN) injection 0-4 mg (has no administration in time range)    Or  LORazepam (ATIVAN) tablet 0-4 mg (has no administration in time range)  thiamine tablet 100 mg (has no administration in time range)    Or  thiamine (B-1) injection 100 mg (has no administration in time range)    ED Course  I have reviewed the triage vital signs and the nursing notes.  Pertinent labs & imaging results that were available during my care of the patient were reviewed by me and considered in my medical decision making (see chart for details).    MDM Rules/Calculators/A&P                      Patient here with reported seizure.  He has a history of seizures and takes Keppra.  He reports compliance.  He also has history of alcohol abuse.  He states he has been drinking alcohol all day today.  I doubt alcohol withdrawal seizure.  Patient is not confused on my exam.  He does seem intoxicated, but answers my questions appropriately and follows commands.  Will allow him to sober up and reassess.  Will check labs in the meantime.  Question his  compliance with his Keppra given his intoxicated state.  Patient has been sleeping here without any complaints.  His LFTs are noted to be somewhat elevated, as they were on his most recent visit.  Suspect this is likely secondary to his alcoholism.  As he is still sobering up, patient will be signed out to oncoming team, Alroy Bailiff, [place], who will continue care and reassess once sober.  Final Clinical Impression(s) / ED Diagnoses Final diagnoses:  Alcohol abuse    Rx / DC Orders ED Discharge Orders    None       Imanol, Bihl, PA-C 02/11/20 4259    Nira Conn, MD 02/11/20 2245

## 2020-02-11 NOTE — ED Provider Notes (Signed)
Care assumed from [employer], [place], at shift change, please see their notes for full documentation of patient's complaint/HPI. Briefly, pt here with complaint of alcohol intoxication. Results so far show ETOH 504 and elevated transaminases likely secondary to alcohol use. Plan is to allow patient to sober up and then reassess; ambulate for safety prior to dispo.   Physical Exam  BP 109/77   Pulse (!) 106   Temp 98.3 F (36.8 C) (Oral)   Resp (!) 33   SpO2 97%   Physical Exam Vitals and nursing note reviewed.  Constitutional:      Appearance: He is not ill-appearing.  HENT:     Head: Normocephalic and atraumatic.  Eyes:     Conjunctiva/sclera: Conjunctivae normal.  Cardiovascular:     Rate and Rhythm: Normal rate and regular rhythm.  Pulmonary:     Effort: Pulmonary effort is normal.     Breath sounds: Normal breath sounds.  Skin:    General: Skin is warm and dry.     Coloration: Skin is not jaundiced.  Neurological:     Mental Status: He is alert.     ED Course/Procedures     Procedures Results for orders placed or performed during the hospital encounter of 02/11/20  Comprehensive metabolic panel  Result Value Ref Range   Sodium 143 135 - 145 mmol/L   Potassium 4.1 3.5 - 5.1 mmol/L   Chloride 101 98 - 111 mmol/L   CO2 25 22 - 32 mmol/L   Glucose, Bld 97 70 - 99 mg/dL   BUN 10 6 - 20 mg/dL   Creatinine, Ser 0.90 0.61 - 1.24 mg/dL   Calcium 8.9 8.9 - 10.3 mg/dL   Total Protein 7.4 6.5 - 8.1 g/dL   Albumin 4.4 3.5 - 5.0 g/dL   AST 299 (H) 15 - 41 U/L   ALT 138 (H) 0 - 44 U/L   Alkaline Phosphatase 164 (H) 38 - 126 U/L   Total Bilirubin 0.7 0.3 - 1.2 mg/dL   GFR calc non Af Amer >60 >60 mL/min   GFR calc Af Amer >60 >60 mL/min   Anion gap 17 (H) 5 - 15  Salicylate level  Result Value Ref Range   Salicylate Lvl <3.0 (L) 7.0 - 30.0 mg/dL  Acetaminophen level  Result Value Ref Range   Acetaminophen (Tylenol), Serum <10 (L) 10 - 30 ug/mL  Ethanol  Result Value  Ref Range   Alcohol, Ethyl (B) 504 (HH) <10 mg/dL  Urine rapid drug screen (hosp performed)  Result Value Ref Range   Opiates NONE DETECTED NONE DETECTED   Cocaine NONE DETECTED NONE DETECTED   Benzodiazepines NONE DETECTED NONE DETECTED   Amphetamines NONE DETECTED NONE DETECTED   Tetrahydrocannabinol NONE DETECTED NONE DETECTED   Barbiturates NONE DETECTED NONE DETECTED  CBC WITH DIFFERENTIAL  Result Value Ref Range   WBC 4.9 4.0 - 10.5 K/uL   RBC 4.05 (L) 4.22 - 5.81 MIL/uL   Hemoglobin 13.5 13.0 - 17.0 g/dL   HCT 38.2 (L) 39.0 - 52.0 %   MCV 94.3 80.0 - 100.0 fL   MCH 33.3 26.0 - 34.0 pg   MCHC 35.3 30.0 - 36.0 g/dL   RDW 12.3 11.5 - 15.5 %   Platelets 127 (L) 150 - 400 K/uL   nRBC 0.0 0.0 - 0.2 %   Neutrophils Relative % 61 %   Neutro Abs 3.0 1.7 - 7.7 K/uL   Lymphocytes Relative 23 %   Lymphs Abs 1.1 0.7 -  4.0 K/uL   Monocytes Relative 14 %   Monocytes Absolute 0.7 0.1 - 1.0 K/uL   Eosinophils Relative 0 %   Eosinophils Absolute 0.0 0.0 - 0.5 K/uL   Basophils Relative 2 %   Basophils Absolute 0.1 0.0 - 0.1 K/uL   Immature Granulocytes 0 %   Abs Immature Granulocytes 0.02 0.00 - 0.07 K/uL  CBG monitoring, ED  Result Value Ref Range   Glucose-Capillary 98 70 - 99 mg/dL    MDM  02:89 AM Attempted to ambulate patient; needed 2 person assist. Unable to keep eyes open. Will have him sober up more and then reassess.   1:26 PM Pt ambulated without assistance. Stable for discharge home.         Tanda Rockers, PA-C 02/11/20 1328    Benjiman Core, MD 02/11/20 1349

## 2020-02-11 NOTE — ED Notes (Signed)
Patient given meal tray.

## 2020-02-12 ENCOUNTER — Other Ambulatory Visit: Payer: Self-pay

## 2020-02-12 ENCOUNTER — Emergency Department (HOSPITAL_COMMUNITY)
Admission: EM | Admit: 2020-02-12 | Discharge: 2020-02-12 | Disposition: A | Discharge status: Home / Self Care | Payer: Self-pay | Attending: Emergency Medicine | Admitting: Emergency Medicine

## 2020-02-12 ENCOUNTER — Encounter (HOSPITAL_COMMUNITY): Payer: Self-pay

## 2020-02-12 DIAGNOSIS — F17210 Nicotine dependence, cigarettes, uncomplicated: Secondary | ICD-10-CM | POA: Insufficient documentation

## 2020-02-12 DIAGNOSIS — Y908 Blood alcohol level of 240 mg/100 ml or more: Secondary | ICD-10-CM | POA: Insufficient documentation

## 2020-02-12 DIAGNOSIS — F10920 Alcohol use, unspecified with intoxication, uncomplicated: Secondary | ICD-10-CM | POA: Insufficient documentation

## 2020-02-12 LAB — BASIC METABOLIC PANEL
Anion gap: 14 (ref 5–15)
BUN: 8 mg/dL (ref 6–20)
CO2: 26 mmol/L (ref 22–32)
Calcium: 9 mg/dL (ref 8.9–10.3)
Chloride: 98 mmol/L (ref 98–111)
Creatinine, Ser: 0.75 mg/dL (ref 0.61–1.24)
GFR calc Af Amer: >60 mL/min (ref >60)
GFR calc non Af Amer: >60 mL/min (ref >60)
Glucose, Bld: 109 mg/dL — ABNORMAL HIGH (ref 70–99)
Potassium: 4.1 mmol/L (ref 3.5–5.1)
Sodium: 138 mmol/L (ref 135–145)

## 2020-02-12 LAB — ETHANOL: Alcohol, Ethyl (B): 467 mg/dL (ref <10)

## 2020-02-12 MED ORDER — CHLORDIAZEPOXIDE HCL 25 MG PO CAPS
ORAL_CAPSULE | ORAL | 0 refills | Status: DC
Start: 2020-02-12 — End: 2020-11-05

## 2020-02-12 MED ORDER — SODIUM CHLORIDE 0.9 % IV BOLUS
1000.0000 mL | Freq: Once | INTRAVENOUS | Status: AC
  Administered 2020-02-12: 1000 mL via INTRAVENOUS

## 2020-02-12 NOTE — ED Notes (Addendum)
Pts brother Azzan Butler to pick up pt at D/C # [phone]

## 2020-02-12 NOTE — ED Notes (Signed)
Patient verbalizes understanding of discharge instructions. Opportunity for questioning and answers were provided. Armband removed by staff, pt discharged from ED ambulatory to home.  

## 2020-02-12 NOTE — ED Triage Notes (Signed)
Patient reports ETOH use daily and reports seizure yesterday. Suppose to be taking keppra but hasnt had in a while. Patient in no distress, denies drug use

## 2020-02-12 NOTE — ED Notes (Signed)
Pt ambulated well to the BR with minimal assistance.  

## 2020-02-12 NOTE — ED Provider Notes (Signed)
[facility] EMERGENCY DEPARTMENT Provider Note   CSN: [id number] Arrival date & time: 02/12/20  1219     History Chief Complaint  Patient presents with  . seizure yesterday/ETOH    Dwayne Bolton is a 44 y.o. male.  The history is provided by the patient, a relative and medical records. No language interpreter was used.     44 year old male with significant history of alcohol abuse, and alcohol-related seizures, brought here by brother for evaluation of alcohol intoxication.  Per brother, patient used to work with him however patient left that job a month ago and the brother has not seen the patient since.  Today the patient show up at his brother's ex girlfriend's house appears intoxicated.  Brother brought patient here requesting help with alcohol detox.  Patient admits that he drank 1 alcohol this morning.  He does not have any specific complaint.  He said he may have had a seizure yesterday but unsure.  He denies any tongue biting or bowel bladder incontinence.  History is limited as patient appears intoxicated.  Past Medical History:  Diagnosis Date  . Alcohol abuse   . Seizure [facility])     Patient Active Problem List   Diagnosis Date Noted  . Tobacco dependence 12/31/2015  . Seizure disorder ([place]) 12/31/2015  . Convulsion ([place])   . Alcohol withdrawal seizure ([place]) 03/22/2015  . GAD (generalized anxiety disorder) 01/13/2014  . Adjustment disorder with mixed anxiety and depressed mood 01/13/2014  . Seizures ([place]) 01/12/2014  . Elevated liver enzymes 01/12/2014  . Alcohol dependence ([place]) 01/11/2014    History reviewed. No pertinent surgical history.     Family History  Problem Relation Age of Onset  . Diabetes Mother   . Hypertension Mother     Social History   Tobacco Use  . Smoking status: Current Every Day Smoker    Packs/day: 0.25    Years: 4.00    Pack years: 1.00    Types: Cigarettes  . Smokeless tobacco: Former Network engineer Use  Topics  . Alcohol use: Yes  . Drug use: No    Home Medications Prior to Admission medications   Medication Sig Start Date End Date Taking? Authorizing Provider  levETIRAcetam (KEPPRA) 500 MG tablet Take 1 tablet (500 mg total) by mouth 2 (two) times daily. 02/07/20   Rancour, Annie Main, MD  lidocaine (LIDODERM) 5 % Place 1 patch onto the skin daily. Remove & Discard patch within 12 hours or as directed by MD 01/20/20   Mesner, Corene Cornea, MD  methocarbamol (ROBAXIN) 500 MG tablet Take 1 tablet (500 mg total) by mouth 2 (two) times daily. Patient not taking: Reported on 02/11/2020 01/20/20   Mesner, Corene Cornea, MD    Allergies    Patient has no known allergies.  Review of Systems   Review of Systems  Unable to perform ROS: Mental status change    Physical Exam Updated Vital Signs BP 124/86 (BP Location: Right Arm)   Pulse (!) 111   Temp 98.2 F (36.8 C) (Oral)   Resp 16   SpO2 100%   Physical Exam Vitals and nursing note reviewed.  Constitutional:      General: He is not in acute distress.    Appearance: He is well-developed.     Comments: Patient appears disheveled, strong alcohol odor, laying in bed in no acute discomfort.  HENT:     Head: Normocephalic and atraumatic.  Eyes:     Conjunctiva/sclera: Conjunctivae normal.  Cardiovascular:  Rate and Rhythm: Tachycardia present.     Pulses: Normal pulses.     Heart sounds: Normal heart sounds.  Pulmonary:     Effort: Pulmonary effort is normal.     Breath sounds: Normal breath sounds. No wheezing.  Abdominal:     Palpations: Abdomen is soft.     Tenderness: There is no abdominal tenderness.  Musculoskeletal:        General: Normal range of motion.     Cervical back: Neck supple.     Comments: Able to move all 4 extremities and follows command but with poor effort.  Skin:    Findings: No rash.  Neurological:     Mental Status: He is alert.     Comments: Follows command but refused to answer a lot of questions.     ED  Results / Procedures / Treatments   Labs (all labs ordered are listed, but only abnormal results are displayed) Labs Reviewed  ETHANOL - Abnormal; Notable for the following components:      Result Value   Alcohol, Ethyl (B) 467 (*)    All other components within normal limits  BASIC METABOLIC PANEL - Abnormal; Notable for the following components:   Glucose, Bld 109 (*)    All other components within normal limits  CBG MONITORING, ED    EKG None  Radiology No results found.  Procedures Procedures (including critical care time)  Medications Ordered in ED Medications  sodium chloride 0.9 % bolus 1,000 mL (1,000 mLs Intravenous New Bag/Given 02/12/20 1802)    ED Course  I have reviewed the triage vital signs and the nursing notes.  Pertinent labs & imaging results that were available during my care of the patient were reviewed by me and considered in my medical decision making (see chart for details).    MDM Rules/Calculators/A&P                      BP 105/70 (BP Location: Right Arm)   Pulse 94   Temp 98.2 F (36.8 C) (Oral)   Resp 16   SpO2 96%   Final Clinical Impression(s) / ED Diagnoses Final diagnoses:  Alcoholic intoxication without complication (HCC)    Rx / DC Orders ED Discharge Orders         Ordered    chlordiazePOXIDE (LIBRIUM) 25 MG capsule     02/12/20 1959         4:39 PM Patient with known history of alcohol abuse, was seen in the ED yesterday for alcohol intoxication, at that time his alcohol level was 504.  He was brought here by his Brother for evaluation and request for help with alcohol detox.  Today his alcohol level is 467.  I doubt patient has alcohol-related seizure, more likely alcohol intoxication.  I will provide medication to help with withdrawal and will provide outpatient resources for alcohol detox.  Patient will be discharged to his brother's care.  8:01 PM Patient has been monitored in the ED, IV fluids given.  Patient  discharged home with Librium taper course for potential alcohol withdrawal and outpatient resources as well.   Fayrene Helper, PA-C 02/12/20 2002    Melene Plan, DO 02/12/20 2004

## 2020-02-12 NOTE — ED Notes (Signed)
TC to Suzy Bouchard., RN  To report Blood alcohol 467

## 2020-02-12 NOTE — Discharge Instructions (Signed)
Take librium for withdrawal and use resources below to seek help for your alcohol dependency.

## 2020-02-13 MED FILL — CHLORDIAZEPOXIDE 25 MG CAP: 25 | 3 days supply | Qty: 10 | Fill #0

## 2020-03-12 DIAGNOSIS — Z8669 Personal history of other diseases of the nervous system and sense organs: Secondary | ICD-10-CM | POA: Insufficient documentation

## 2020-03-12 DIAGNOSIS — F17210 Nicotine dependence, cigarettes, uncomplicated: Secondary | ICD-10-CM | POA: Insufficient documentation

## 2020-03-12 DIAGNOSIS — W208XXA Other cause of strike by thrown, projected or falling object, initial encounter: Secondary | ICD-10-CM | POA: Insufficient documentation

## 2020-03-12 DIAGNOSIS — M545 Low back pain: Secondary | ICD-10-CM | POA: Insufficient documentation

## 2020-03-12 DIAGNOSIS — Z79899 Other long term (current) drug therapy: Secondary | ICD-10-CM | POA: Insufficient documentation

## 2020-03-13 ENCOUNTER — Emergency Department (HOSPITAL_COMMUNITY): Payer: Self-pay

## 2020-03-13 ENCOUNTER — Emergency Department (HOSPITAL_COMMUNITY)
Admission: EM | Admit: 2020-03-13 | Discharge: 2020-03-13 | Disposition: A | Discharge status: Home / Self Care | Payer: Self-pay | Attending: Emergency Medicine | Admitting: Emergency Medicine

## 2020-03-13 ENCOUNTER — Other Ambulatory Visit: Payer: Self-pay

## 2020-03-13 ENCOUNTER — Encounter (HOSPITAL_COMMUNITY): Payer: Self-pay | Admitting: Emergency Medicine

## 2020-03-13 DIAGNOSIS — M545 Low back pain, unspecified: Secondary | ICD-10-CM

## 2020-03-13 MED ORDER — IBUPROFEN 600 MG PO TABS
600.0000 mg | ORAL_TABLET | Freq: Four times a day (QID) | ORAL | 0 refills | Status: DC | PRN
Start: 2020-03-13 — End: 2021-12-07

## 2020-03-13 MED FILL — IBUPROFEN 600 MG TABLET: 600 | 7 days supply | Qty: 30 | Fill #0

## 2020-03-13 NOTE — ED Notes (Signed)
Pt noted to be awake now - ambulatory w/o difficulty. Pt states "I'm good". C/o "some back pain". Message sent to B Laveda Norman, APP.

## 2020-03-13 NOTE — ED Notes (Signed)
Pt did not answer for vs  

## 2020-03-13 NOTE — ED Triage Notes (Signed)
Pt reports he was laying in bed and the sheet rock from the ceiling fell onto his back.  ETOH

## 2020-03-13 NOTE — ED Provider Notes (Signed)
[facility] EMERGENCY DEPARTMENT Provider Note   CSN: [id number] Arrival date & time: 03/12/20  2356     History Chief Complaint  Patient presents with  . Back Pain    Dwayne Bolton is a 44 y.o. male.  Patient presents to the emergency department with a chief complaint of back pain.  He states that he was asleep and a piece of the sheet rock ceiling fell and landed on his back.  He states that it also hit his head.  He does not think that he passed out.  He has been drinking alcohol.  He complains of moderate low back pain.  He denies numbness or weakness or tingling of his lower extremities.  Denies difficulty ambulating.  Denies any other injuries.  The history is provided by the patient. No language interpreter was used.       Past Medical History:  Diagnosis Date  . Alcohol abuse   . Seizure [facility])     Patient Active Problem List   Diagnosis Date Noted  . Tobacco dependence 12/31/2015  . Seizure disorder (HCC) 12/31/2015  . Convulsion (HCC)   . Alcohol withdrawal seizure (HCC) 03/22/2015  . GAD (generalized anxiety disorder) 01/13/2014  . Adjustment disorder with mixed anxiety and depressed mood 01/13/2014  . Seizures (HCC) 01/12/2014  . Elevated liver enzymes 01/12/2014  . Alcohol dependence (HCC) 01/11/2014    History reviewed. No pertinent surgical history.     Family History  Problem Relation Age of Onset  . Diabetes Mother   . Hypertension Mother     Social History   Tobacco Use  . Smoking status: Current Every Day Smoker    Packs/day: 0.25    Years: 4.00    Pack years: 1.00    Types: Cigarettes  . Smokeless tobacco: Former Engineer, water Use Topics  . Alcohol use: Yes  . Drug use: No    Home Medications Prior to Admission medications   Medication Sig Start Date End Date Taking? Authorizing Provider  chlordiazePOXIDE (LIBRIUM) 25 MG capsule 50mg  PO TID x 1D, then 25-50mg  PO BID X 1D, then 25-50mg  PO QD X 1D 02/12/20    02/14/20, PA-C  levETIRAcetam (KEPPRA) 500 MG tablet Take 1 tablet (500 mg total) by mouth 2 (two) times daily. 02/07/20   Rancour, 02/09/20, MD  lidocaine (LIDODERM) 5 % Place 1 patch onto the skin daily. Remove & Discard patch within 12 hours or as directed by MD 01/20/20   Mesner, 03/21/20, MD  methocarbamol (ROBAXIN) 500 MG tablet Take 1 tablet (500 mg total) by mouth 2 (two) times daily. Patient not taking: Reported on 02/11/2020 01/20/20   Mesner, 03/21/20, MD    Allergies    Patient has no known allergies.  Review of Systems   Review of Systems  All other systems reviewed and are negative.   Physical Exam Updated Vital Signs BP 110/77   Pulse 79   Temp 97.9 F (36.6 C)   Resp 17   Ht 5\' 11"  (1.803 m)   Wt 77.1 kg   SpO2 98%   BMI 23.71 kg/m   Physical Exam Vitals and nursing note reviewed.  Constitutional:      Appearance: He is well-developed.  HENT:     Head: Normocephalic and atraumatic.  Eyes:     Conjunctiva/sclera: Conjunctivae normal.  Cardiovascular:     Rate and Rhythm: Normal rate and regular rhythm.     Heart sounds: No murmur.  Pulmonary:  Effort: Pulmonary effort is normal. No respiratory distress.     Breath sounds: Normal breath sounds.  Abdominal:     Palpations: Abdomen is soft.     Tenderness: There is no abdominal tenderness.  Musculoskeletal:     Cervical back: Neck supple.     Comments: CTL spine without bony step-off or deformity, no tenderness on my exam, patient ambulates with very unsteady gait (presumed due to ETOH)  Skin:    General: Skin is warm and dry.  Neurological:     Mental Status: He is alert and oriented to person, place, and time.  Psychiatric:     Comments: intoxicated     ED Results / Procedures / Treatments   Labs (all labs ordered are listed, but only abnormal results are displayed) Labs Reviewed - No data to display  EKG None  Radiology No results found.  Procedures Procedures (including critical care time)   Medications Ordered in ED Medications - No data to display  ED Course  I have reviewed the triage vital signs and the nursing notes.  Pertinent labs & imaging results that were available during my care of the patient were reviewed by me and considered in my medical decision making (see chart for details).    MDM Rules/Calculators/A&P                      Patient reports being asleep and had a piece of sheet rock fall and land on his back.  He has been drinking.  He complains of low back pain.  He does not have any concerning findings on my exam, but given that he is intoxicated, will check imaging.  He is very unsteady, and will need to sober up prior to discharge.  My suspicion for acute traumatic injury is very low.  Patient signed out to Haywood Pao, who will follow-up on x-ray and reassess when more sober.   Final Clinical Impression(s) / ED Diagnoses Final diagnoses:  None    Rx / DC Orders ED Discharge Orders    None       Draken, Farrior, PA-C 03/13/20 2751    Merryl Hacker, MD 03/13/20 [phone]

## 2020-03-13 NOTE — ED Provider Notes (Signed)
Received signout at the beginning of shift.  Please see previous providers notes for complete H&P.  This is a 44 year old male presenting for evaluation of back pain.  Patient report he was staying at hotel when a piece of sheet rock fell and struck his back.  He was complaining of low back pain.  Patient was also found to be intoxicated, and admits to alcohol consumption.  Patient was monitored in the ED for approximately 10 hours.  At this time he feels much better, clinically sober, ambulate without difficulty, and requesting to be discharged.  An x-ray of his lumbar spine obtained showing no acute abnormalities.  Even though the coccyx is partially blocked from view, he does not have significant coccygeal pain.  He is stable for discharge.  Return precaution discussed.  BP 123/84   Pulse 85   Temp 98.8 F (37.1 C) (Oral)   Resp 18   Ht 5\' 11"  (1.803 m)   Wt 77.1 kg   SpO2 98%   BMI 23.71 kg/m   Results for orders placed or performed during the hospital encounter of 02/12/20  Ethanol  Result Value Ref Range   Alcohol, Ethyl (B) 467 (HH) <10 mg/dL  Basic metabolic panel  Result Value Ref Range   Sodium 138 135 - 145 mmol/L   Potassium 4.1 3.5 - 5.1 mmol/L   Chloride 98 98 - 111 mmol/L   CO2 26 22 - 32 mmol/L   Glucose, Bld 109 (H) 70 - 99 mg/dL   BUN 8 6 - 20 mg/dL   Creatinine, Ser 02/14/20 0.61 - 1.24 mg/dL   Calcium 9.0 8.9 - 7.58 mg/dL   GFR calc non Af Amer >60 >60 mL/min   GFR calc Af Amer >60 >60 mL/min   Anion gap 14 5 - 15   DG Lumbar Spine 2-3 Views  Result Date: 03/13/2020 CLINICAL DATA:  Low back pain, struck by falling sheet rock EXAM: LUMBAR SPINE - 2-3 VIEW COMPARISON:  None. FINDINGS: Five lumbar type vertebral bodies with partial sacralization of the left L5 transverse process with pseudoarticulation with the adjacent sacral ala. No acute fracture or vertebral body height loss. Vertebral bodies and posterior elements are normally aligned. Gross preservation of the  intervertebral disc spaces with at most mild discogenic change at L5-S1 and minimal facet arthropathy. The coccyx is largely collimated from view. Remaining portions of the bony pelvis are intact and congruent. Soft tissues are free of acute abnormality. IMPRESSION: 1. No acute osseous abnormality. 2. Minimal degenerative change at L5-S1. 3. The coccyx is partially collimated from view. If there is concern for sacrococcygeal fracture, dedicated radiographs or cross-sectional imaging could be obtained. Electronically Signed   By: 03/15/2020 M.D.   On: 03/13/2020 06:51      03/15/2020, PA-C 03/13/20 1031    03/15/20, MD 03/13/20 [phone]

## 2020-03-13 NOTE — ED Notes (Signed)
Patient gone to xray 

## 2020-04-09 ENCOUNTER — Emergency Department (HOSPITAL_COMMUNITY)
Admission: EM | Admit: 2020-04-09 | Discharge: 2020-04-09 | Disposition: A | Discharge status: Home / Self Care | Payer: Self-pay | Attending: Emergency Medicine | Admitting: Emergency Medicine

## 2020-04-09 ENCOUNTER — Emergency Department (HOSPITAL_COMMUNITY): Payer: Self-pay

## 2020-04-09 DIAGNOSIS — F1010 Alcohol abuse, uncomplicated: Secondary | ICD-10-CM | POA: Insufficient documentation

## 2020-04-09 DIAGNOSIS — R531 Weakness: Secondary | ICD-10-CM | POA: Insufficient documentation

## 2020-04-09 DIAGNOSIS — G40919 Epilepsy, unspecified, intractable, without status epilepticus: Secondary | ICD-10-CM

## 2020-04-09 DIAGNOSIS — R519 Headache, unspecified: Secondary | ICD-10-CM | POA: Insufficient documentation

## 2020-04-09 DIAGNOSIS — F17210 Nicotine dependence, cigarettes, uncomplicated: Secondary | ICD-10-CM | POA: Insufficient documentation

## 2020-04-09 DIAGNOSIS — R569 Unspecified convulsions: Secondary | ICD-10-CM | POA: Insufficient documentation

## 2020-04-09 LAB — CBC WITH DIFFERENTIAL/PLATELET
Abs Immature Granulocytes: 0.03 10*3/uL (ref 0.00–0.07)
Basophils Absolute: 0.1 10*3/uL (ref 0.0–0.1)
Basophils Relative: 1 %
Eosinophils Absolute: 0.1 10*3/uL (ref 0.0–0.5)
Eosinophils Relative: 1 %
HCT: 36.1 % — ABNORMAL LOW (ref 39.0–52.0)
Hemoglobin: 12.6 g/dL — ABNORMAL LOW (ref 13.0–17.0)
Immature Granulocytes: 1 %
Lymphocytes Relative: 15 %
Lymphs Abs: 1 10*3/uL (ref 0.7–4.0)
MCH: 34.4 pg — ABNORMAL HIGH (ref 26.0–34.0)
MCHC: 34.9 g/dL (ref 30.0–36.0)
MCV: 98.6 fL (ref 80.0–100.0)
Monocytes Absolute: 1 10*3/uL (ref 0.1–1.0)
Monocytes Relative: 16 %
Neutro Abs: 4.3 10*3/uL (ref 1.7–7.7)
Neutrophils Relative %: 66 %
Platelets: 121 10*3/uL — ABNORMAL LOW (ref 150–400)
RBC: 3.66 MIL/uL — ABNORMAL LOW (ref 4.22–5.81)
RDW: 12.4 % (ref 11.5–15.5)
WBC: 6.5 10*3/uL (ref 4.0–10.5)
nRBC: 0 % (ref 0.0–0.2)

## 2020-04-09 LAB — COMPREHENSIVE METABOLIC PANEL
ALT: 86 U/L — ABNORMAL HIGH (ref 0–44)
AST: 106 U/L — ABNORMAL HIGH (ref 15–41)
Albumin: 3.9 g/dL (ref 3.5–5.0)
Alkaline Phosphatase: 118 U/L (ref 38–126)
Anion gap: 14 (ref 5–15)
BUN: 7 mg/dL (ref 6–20)
CO2: 22 mmol/L (ref 22–32)
Calcium: 9.1 mg/dL (ref 8.9–10.3)
Chloride: 98 mmol/L (ref 98–111)
Creatinine, Ser: 0.88 mg/dL (ref 0.61–1.24)
GFR calc Af Amer: >60 mL/min (ref >60)
GFR calc non Af Amer: >60 mL/min (ref >60)
Glucose, Bld: 90 mg/dL (ref 70–99)
Potassium: 3.8 mmol/L (ref 3.5–5.1)
Sodium: 134 mmol/L — ABNORMAL LOW (ref 135–145)
Total Bilirubin: 0.9 mg/dL (ref 0.3–1.2)
Total Protein: 6.8 g/dL (ref 6.5–8.1)

## 2020-04-09 LAB — RAPID URINE DRUG SCREEN, HOSP PERFORMED
Amphetamines: NOT DETECTED
Barbiturates: NOT DETECTED
Benzodiazepines: NOT DETECTED
Cocaine: NOT DETECTED
Opiates: NOT DETECTED
Tetrahydrocannabinol: NOT DETECTED

## 2020-04-09 LAB — ETHANOL: Alcohol, Ethyl (B): 196 mg/dL — ABNORMAL HIGH (ref <10)

## 2020-04-09 MED ORDER — LEVETIRACETAM IN NACL 1000 MG/100ML IV SOLN
1000.0000 mg | Freq: Once | INTRAVENOUS | Status: AC
  Administered 2020-04-09: 1000 mg via INTRAVENOUS
  Filled 2020-04-09: qty 100

## 2020-04-09 MED ORDER — LEVETIRACETAM 500 MG PO TABS
500.0000 mg | ORAL_TABLET | Freq: Two times a day (BID) | ORAL | 0 refills | Status: DC
Start: 2020-04-09 — End: 2020-10-15

## 2020-04-09 MED ORDER — THIAMINE HCL 100 MG PO TABS
100.0000 mg | ORAL_TABLET | Freq: Every day | ORAL | Status: DC
  Administered 2020-04-09: 100 mg via ORAL
  Filled 2020-04-09: qty 1

## 2020-04-09 MED ORDER — LORAZEPAM 1 MG PO TABS
0.0000 mg | ORAL_TABLET | Freq: Four times a day (QID) | ORAL | Status: DC
  Administered 2020-04-09: 2 mg via ORAL
  Filled 2020-04-09: qty 2

## 2020-04-09 MED ORDER — LORAZEPAM 2 MG/ML IJ SOLN: 0.0000 mg | Freq: Four times a day (QID) | INTRAMUSCULAR | Status: DC

## 2020-04-09 MED ORDER — LORAZEPAM 2 MG/ML IJ SOLN: 0.0000 mg | Freq: Two times a day (BID) | INTRAMUSCULAR | Status: DC

## 2020-04-09 MED ORDER — THIAMINE HCL 100 MG/ML IJ SOLN: 100.0000 mg | Freq: Every day | INTRAMUSCULAR | Status: DC

## 2020-04-09 MED ORDER — LORAZEPAM 1 MG PO TABS: 0.0000 mg | ORAL_TABLET | Freq: Two times a day (BID) | ORAL | Status: DC

## 2020-04-09 NOTE — ED Provider Notes (Signed)
[facility] EMERGENCY DEPARTMENT Provider Note   CSN: [id number] Arrival date & time: 04/09/20  1430     History Chief Complaint  Patient presents with  . Alcohol Problem    Dwayne Bolton is a 44 y.o. male with a past medical history of alcohol abuse, seizure disorder not currently on antiepileptics presenting to the ED with a chief complaint of seizure.  At approximately 11 AM this morning his friend was concerned that he may have had a seizure.  States that they were walking when he began shaking and then had a syncopal episode on the road.  EMS was called but patient refused transport at the time.  He called them back because he now feels overall weak and has been having a headache.  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  He is unsure what caused his seizures in the past.  He denies daily alcohol use, states that he will drink 1-3 beers only on the weekends.  He last drink 1 beer this morning.  He denies any neck stiffness, numbness in arms or legs, vision changes, vomiting, chest pain, fever.  HPI     Past Medical History:  Diagnosis Date  . Alcohol abuse   . Seizure [facility])     Patient Active Problem List   Diagnosis Date Noted  . Tobacco dependence 12/31/2015  . Seizure disorder (HCC) 12/31/2015  . Convulsion (HCC)   . Alcohol withdrawal seizure (HCC) 03/22/2015  . GAD (generalized anxiety disorder) 01/13/2014  . Adjustment disorder with mixed anxiety and depressed mood 01/13/2014  . Seizures (HCC) 01/12/2014  . Elevated liver enzymes 01/12/2014  . Alcohol dependence (HCC) 01/11/2014    No past surgical history on file.     Family History  Problem Relation Age of Onset  . Diabetes Mother   . Hypertension Mother     Social History   Tobacco Use  . Smoking status: Current Every Day Smoker    Packs/day: 0.25    Years: 4.00    Pack years: 1.00    Types: Cigarettes  .  Smokeless tobacco: Former Clinical biochemist  . Vaping Use: Never used  Substance Use Topics  . Alcohol use: Yes  . Drug use: No    Home Medications Prior to Admission medications   Medication Sig Start Date End Date Taking? Authorizing Provider  chlordiazePOXIDE (LIBRIUM) 25 MG capsule 50mg  PO TID x 1D, then 25-50mg  PO BID X 1D, then 25-50mg  PO QD X 1D 02/12/20   02/14/20, PA-C  ibuprofen (ADVIL) 600 MG tablet Take 1 tablet (600 mg total) by mouth every 6 (six) hours as needed. 03/13/20   03/15/20, PA-C  levETIRAcetam (KEPPRA) 500 MG tablet Take 1 tablet (500 mg total) by mouth 2 (two) times daily. 04/09/20   Shenea Giacobbe, PA-C  lidocaine (LIDODERM) 5 % Place 1 patch onto the skin daily. Remove & Discard patch within 12 hours or as directed by MD 01/20/20   Mesner, 03/21/20, MD  methocarbamol (ROBAXIN) 500 MG tablet Take 1 tablet (500 mg total) by mouth 2 (two) times daily. Patient not taking: Reported on 02/11/2020 01/20/20   Mesner, 03/21/20, MD    Allergies    Patient has no known allergies.  Review of Systems   Review of Systems  Constitutional: Negative for appetite change, chills and fever.  HENT: Negative for ear pain, rhinorrhea, sneezing and sore  throat.   Eyes: Negative for photophobia and visual disturbance.  Respiratory: Negative for cough, chest tightness, shortness of breath and wheezing.   Cardiovascular: Negative for chest pain and palpitations.  Gastrointestinal: Negative for abdominal pain, blood in stool, constipation, diarrhea, nausea and vomiting.  Genitourinary: Negative for dysuria, hematuria and urgency.  Musculoskeletal: Negative for myalgias.  Skin: Negative for rash.  Neurological: Positive for seizures, syncope and headaches. Negative for dizziness, weakness and light-headedness.    Physical Exam Updated Vital Signs BP 108/79 (BP Location: Right Arm)   Pulse 94   Temp 98.6 F (37 C) (Oral)   Resp 16   SpO2 100%   Physical Exam Vitals and nursing note  reviewed.  Constitutional:      General: He is not in acute distress.    Appearance: He is well-developed.  HENT:     Head: Normocephalic and atraumatic.     Nose: Nose normal.  Eyes:     General: No scleral icterus.       Right eye: No discharge.        Left eye: No discharge.     Conjunctiva/sclera: Conjunctivae normal.     Pupils: Pupils are equal, round, and reactive to light.  Cardiovascular:     Rate and Rhythm: Normal rate and regular rhythm.     Heart sounds: Normal heart sounds. No murmur heard.  No friction rub. No gallop.   Pulmonary:     Effort: Pulmonary effort is normal. No respiratory distress.     Breath sounds: Normal breath sounds.  Abdominal:     General: Bowel sounds are normal. There is no distension.     Palpations: Abdomen is soft.     Tenderness: There is no abdominal tenderness. There is no guarding.  Musculoskeletal:        General: Normal range of motion.     Cervical back: Normal range of motion and neck supple.  Skin:    General: Skin is warm and dry.     Findings: No rash.  Neurological:     General: No focal deficit present.     Mental Status: He is alert and oriented to person, place, and time.     Cranial Nerves: No cranial nerve deficit.     Sensory: No sensory deficit.     Motor: No weakness or abnormal muscle tone.     Coordination: Coordination normal.     Comments: Pupils reactive. No facial asymmetry noted. Cranial nerves appear grossly intact. Sensation intact to light touch on face, BUE and BLE. Strength 5/5 in BUE and BLE.     ED Results / Procedures / Treatments   Labs (all labs ordered are listed, but only abnormal results are displayed) Labs Reviewed  COMPREHENSIVE METABOLIC PANEL - Abnormal; Notable for the following components:      Result Value   Sodium 134 (*)    AST 106 (*)    ALT 86 (*)    All other components within normal limits  ETHANOL - Abnormal; Notable for the following components:   Alcohol, Ethyl (B) 196  (*)    All other components within normal limits  CBC WITH DIFFERENTIAL/PLATELET - Abnormal; Notable for the following components:   RBC 3.66 (*)    Hemoglobin 12.6 (*)    HCT 36.1 (*)    MCH 34.4 (*)    Platelets 121 (*)    All other components within normal limits  RAPID URINE DRUG SCREEN, HOSP PERFORMED  CBG MONITORING, ED  EKG EKG Interpretation  Date/Time:  Monday April 09 2020 17:38:24 EDT Ventricular Rate:  92 PR Interval:    QRS Duration: 96 QT Interval:  368 QTC Calculation: 456 R Axis:   104 Text Interpretation: Sinus rhythm Right axis deviation Low voltage, precordial leads Probable anteroseptal infarct, old No significant change since last tracing Confirmed by Benjiman Core [phone]) on 04/09/2020 6:39:23 PM   Radiology CT Head Wo Contrast  Result Date: 04/09/2020 CLINICAL DATA:  Seizure, unknown origin. Additional provided: Patient reports seizure around 11 a.m. today, patient reports he does not feel well. EXAM: CT HEAD WITHOUT CONTRAST TECHNIQUE: Contiguous axial images were obtained from the base of the skull through the vertex without intravenous contrast. COMPARISON:  Prior head 02/20/2020, brain MRI 03/23/2015. FINDINGS: Brain: There is stable, mild generalized parenchymal atrophy. There is no acute intracranial hemorrhage. No demarcated cortical infarct is identified. No extra-axial fluid collection. No evidence of intracranial mass. No midline shift. Vascular: No hyperdense vessel. Skull: Normal. Negative for fracture or focal lesion. Sinuses/Orbits: Visualized orbits show no acute finding. Mild ethmoid and left frontal sinus mucosal thickening at the imaged levels. No significant mastoid effusion. IMPRESSION: No CT evidence of acute intracranial abnormality. Stable, mild generalized parenchymal atrophy. Mild paranasal sinus mucosal thickening at the imaged levels. Electronically Signed   By: Jackey Loge DO   On: 04/09/2020 15:41    Procedures Procedures  (including critical care time)  Medications Ordered in ED Medications  LORazepam (ATIVAN) injection 0-4 mg ( Intravenous See Alternative 04/09/20 1642)    Or  LORazepam (ATIVAN) tablet 0-4 mg (2 mg Oral Given 04/09/20 1642)  LORazepam (ATIVAN) injection 0-4 mg (has no administration in time range)    Or  LORazepam (ATIVAN) tablet 0-4 mg (has no administration in time range)  thiamine tablet 100 mg (100 mg Oral Given 04/09/20 1642)    Or  thiamine (B-1) injection 100 mg ( Intravenous See Alternative 04/09/20 1642)  levETIRAcetam (KEPPRA) IVPB 1000 mg/100 mL premix (0 mg Intravenous Stopped 04/09/20 1814)    ED Course  I have reviewed the triage vital signs and the nursing notes.  Pertinent labs & imaging results that were available during my care of the patient were reviewed by me and considered in my medical decision making (see chart for details).    MDM Rules/Calculators/A&P                          44 year old male with past medical history of alcohol abuse, seizure disorder not currently on antiepileptics due to financial constraints presenting to the ED with chief complaint of seizure.  Friend was concerned at approximately 11 AM that he may have had a seizure when he was walking on the road.  Reports syncopal episode shortly thereafter.  Since then has been having a headache and feels overall weak and fatigued.  He did drink 1 beer in the morning, denies daily alcohol use.  States that at baseline he will drink 1-3 beers only on the weekends.  He has not been on his Keppra for the past 9 to 10 months and has not followed up with neurology.  On exam patient without any neurological deficits noted.  No numbness, weakness or meningeal signs noted.  He is afebrile without recent use of antipyretics.  No signs of trauma noted. No tremors noted.  He is alert, oriented x3.  Lab work significant for alcohol level of 196, CBC showing hemoglobin at baseline.  CMP  with slight elevation in AST and ALT.   UDS is negative.  CT of the head is negative for acute abnormality.  I suspect that his seizure was a breakthrough seizure from not taking his Keppra as prescribed.  His alcohol level is high enough that I do not believe this is an alcohol withdrawal seizure.  He was given Ativan since then has been asymptomatic.  He was also given a loading dose of Keppra 1000mg . He remains ambulatory here with normal gait. He was observed here for greater than 4 hours without any recurrence of his seizure-like activity.  I educated him the importance of taking his Keppra and following up with neurology.  Patient is comfortable with discharge home and will be given strict return precautions.  All imaging, if done today, including plain films, CT scans, and ultrasounds, independently reviewed by me, and interpretations confirmed via formal radiology reads.  Patient is hemodynamically stable, in NAD, and able to ambulate in the ED. Evaluation does not show pathology that would require ongoing emergent intervention or inpatient treatment. I explained the diagnosis to the patient. Pain has been managed and has no complaints prior to discharge. Patient is comfortable with above plan and is stable for discharge at this time. All questions were answered prior to disposition. Strict return precautions for returning to the ED were discussed. Encouraged follow up with PCP.   An After Visit Summary was printed and given to the patient.   Portions of this note were generated with . Dictation errors may occur despite best attempts at proofreading.  Final Clinical Impression(s) / ED Diagnoses Final diagnoses:  Breakthrough seizure [facility])    Rx / DC Orders ED Discharge Orders         Ordered    levETIRAcetam (KEPPRA) 500 MG tablet  2 times daily     Discontinue  Reprint     04/09/20 1843           04/11/20, PA-C 04/09/20 1844    04/11/20, MD 04/10/20 2034

## 2020-04-09 NOTE — ED Triage Notes (Signed)
Pt arrives via [facility] with c/o of seizure that happened 11am called ems but refused transport- pt called them back due to not feeling well. Pt states only had 1 beer today.

## 2020-04-09 NOTE — ED Notes (Signed)
Patient verbalizes understanding of discharge instructions. Opportunity for questioning and answers were provided. Armband removed by staff, pt discharged from ED.  

## 2020-04-09 NOTE — Discharge Instructions (Signed)
It is important for you to take your Keppra as directed. You will need to also follow-up with your neurologist. Return to the ED if you start to experience tremors, additional seizures, chest pain, shortness of breath.

## 2020-04-10 MED FILL — levETIRAcetam 500 MG TABS: 500 | 30 days supply | Qty: 60 | Fill #0

## 2020-06-01 ENCOUNTER — Encounter (HOSPITAL_COMMUNITY): Payer: Self-pay | Admitting: Emergency Medicine

## 2020-06-01 ENCOUNTER — Other Ambulatory Visit: Payer: Self-pay

## 2020-06-01 DIAGNOSIS — Z5321 Procedure and treatment not carried out due to patient leaving prior to being seen by health care provider: Secondary | ICD-10-CM | POA: Insufficient documentation

## 2020-06-01 DIAGNOSIS — F10129 Alcohol abuse with intoxication, unspecified: Secondary | ICD-10-CM | POA: Insufficient documentation

## 2020-06-01 LAB — CBC
HCT: 41.9 % (ref 39.0–52.0)
Hemoglobin: 14.9 g/dL (ref 13.0–17.0)
MCH: 34.3 pg — ABNORMAL HIGH (ref 26.0–34.0)
MCHC: 35.6 g/dL (ref 30.0–36.0)
MCV: 96.5 fL (ref 80.0–100.0)
Platelets: 203 10*3/uL (ref 150–400)
RBC: 4.34 MIL/uL (ref 4.22–5.81)
RDW: 13.6 % (ref 11.5–15.5)
WBC: 5.2 10*3/uL (ref 4.0–10.5)
nRBC: 0 % (ref 0.0–0.2)

## 2020-06-01 LAB — BASIC METABOLIC PANEL
Anion gap: 15 (ref 5–15)
BUN: 10 mg/dL (ref 6–20)
CO2: 26 mmol/L (ref 22–32)
Calcium: 9.1 mg/dL (ref 8.9–10.3)
Chloride: 101 mmol/L (ref 98–111)
Creatinine, Ser: 0.87 mg/dL (ref 0.61–1.24)
GFR calc Af Amer: >60 mL/min (ref >60)
GFR calc non Af Amer: >60 mL/min (ref >60)
Glucose, Bld: 96 mg/dL (ref 70–99)
Potassium: 4.4 mmol/L (ref 3.5–5.1)
Sodium: 142 mmol/L (ref 135–145)

## 2020-06-01 NOTE — ED Triage Notes (Signed)
Patient arrives via EMS complaining of seizure approx 20 minutes ago. Patient states last drink was this morning. Patient is supposed to be taking Keppra but has not.

## 2020-06-02 ENCOUNTER — Emergency Department (HOSPITAL_COMMUNITY)
Admission: EM | Admit: 2020-06-02 | Discharge: 2020-06-02 | Disposition: A | Discharge status: Home / Self Care | Payer: Self-pay | Attending: Emergency Medicine | Admitting: Emergency Medicine

## 2020-08-13 ENCOUNTER — Other Ambulatory Visit: Payer: Self-pay

## 2020-08-13 ENCOUNTER — Encounter (HOSPITAL_COMMUNITY): Payer: Self-pay | Admitting: Emergency Medicine

## 2020-08-13 ENCOUNTER — Emergency Department (HOSPITAL_COMMUNITY): Payer: Self-pay

## 2020-08-13 ENCOUNTER — Emergency Department (HOSPITAL_COMMUNITY)
Admission: EM | Admit: 2020-08-13 | Discharge: 2020-08-13 | Disposition: A | Discharge status: Home / Self Care | Payer: Self-pay | Attending: Emergency Medicine | Admitting: Emergency Medicine

## 2020-08-13 DIAGNOSIS — S60941A Unspecified superficial injury of left index finger, initial encounter: Secondary | ICD-10-CM | POA: Insufficient documentation

## 2020-08-13 DIAGNOSIS — Z5321 Procedure and treatment not carried out due to patient leaving prior to being seen by health care provider: Secondary | ICD-10-CM | POA: Insufficient documentation

## 2020-08-13 DIAGNOSIS — W19XXXA Unspecified fall, initial encounter: Secondary | ICD-10-CM | POA: Insufficient documentation

## 2020-08-13 NOTE — ED Notes (Signed)
Pt stated that he is not able to wait any longer. He has to work in the morning and is not able to miss work.

## 2020-08-13 NOTE — ED Triage Notes (Signed)
Patient arrives to ED with complaints of falling and grabbing himself before hitting the ground and hurting his left index finger. Finger is swollen and painful.

## 2020-10-14 ENCOUNTER — Encounter (HOSPITAL_COMMUNITY): Payer: Self-pay

## 2020-10-14 ENCOUNTER — Other Ambulatory Visit: Payer: Self-pay

## 2020-10-14 DIAGNOSIS — F17210 Nicotine dependence, cigarettes, uncomplicated: Secondary | ICD-10-CM | POA: Insufficient documentation

## 2020-10-14 DIAGNOSIS — F10129 Alcohol abuse with intoxication, unspecified: Secondary | ICD-10-CM | POA: Insufficient documentation

## 2020-10-14 DIAGNOSIS — R569 Unspecified convulsions: Secondary | ICD-10-CM | POA: Insufficient documentation

## 2020-10-14 DIAGNOSIS — Z79899 Other long term (current) drug therapy: Secondary | ICD-10-CM | POA: Insufficient documentation

## 2020-10-14 DIAGNOSIS — Y900 Blood alcohol level of less than 20 mg/100 ml: Secondary | ICD-10-CM | POA: Insufficient documentation

## 2020-10-14 NOTE — ED Triage Notes (Addendum)
Pt arrives EMS from [place] with c/o seizures. Pt sts he had 2 beers and felt a seizure coming on and lowered himself to the floor. Unwitnessed. Pt emptied bowels and bladder. Sts he takes 1mg  lorazepam BID but has not been taking medication for 1 month. Last seizure 1 year ago per pt.

## 2020-10-14 NOTE — ED Notes (Signed)
Pt came in incontinent of stool, pt ambulatory to restroom to clean up and change into paper scrubs.

## 2020-10-15 ENCOUNTER — Emergency Department (HOSPITAL_COMMUNITY): Payer: Self-pay

## 2020-10-15 ENCOUNTER — Emergency Department (HOSPITAL_COMMUNITY)
Admission: EM | Admit: 2020-10-15 | Discharge: 2020-10-15 | Disposition: A | Discharge status: Home / Self Care | Payer: Self-pay | Attending: Emergency Medicine | Admitting: Emergency Medicine

## 2020-10-15 DIAGNOSIS — G40919 Epilepsy, unspecified, intractable, without status epilepticus: Secondary | ICD-10-CM

## 2020-10-15 DIAGNOSIS — F1010 Alcohol abuse, uncomplicated: Secondary | ICD-10-CM

## 2020-10-15 LAB — RAPID URINE DRUG SCREEN, HOSP PERFORMED
Amphetamines: NOT DETECTED
Barbiturates: NOT DETECTED
Benzodiazepines: NOT DETECTED
Cocaine: NOT DETECTED
Opiates: NOT DETECTED
Tetrahydrocannabinol: NOT DETECTED

## 2020-10-15 LAB — COMPREHENSIVE METABOLIC PANEL
ALT: 102 U/L — ABNORMAL HIGH (ref 0–44)
AST: 198 U/L — ABNORMAL HIGH (ref 15–41)
Albumin: 4.3 g/dL (ref 3.5–5.0)
Alkaline Phosphatase: 129 U/L — ABNORMAL HIGH (ref 38–126)
Anion gap: 14 (ref 5–15)
BUN: 7 mg/dL (ref 6–20)
CO2: 25 mmol/L (ref 22–32)
Calcium: 8.6 mg/dL — ABNORMAL LOW (ref 8.9–10.3)
Chloride: 98 mmol/L (ref 98–111)
Creatinine, Ser: 1.15 mg/dL (ref 0.61–1.24)
GFR, Estimated: >60 mL/min (ref >60)
Glucose, Bld: 87 mg/dL (ref 70–99)
Potassium: 5.8 mmol/L — ABNORMAL HIGH (ref 3.5–5.1)
Sodium: 137 mmol/L (ref 135–145)
Total Bilirubin: 1 mg/dL (ref 0.3–1.2)
Total Protein: 8.1 g/dL (ref 6.5–8.1)

## 2020-10-15 LAB — URINALYSIS, ROUTINE W REFLEX MICROSCOPIC
Bilirubin Urine: NEGATIVE
Glucose, UA: NEGATIVE mg/dL
Ketones, ur: NEGATIVE mg/dL
Leukocytes,Ua: NEGATIVE
Nitrite: NEGATIVE
Protein, ur: 100 mg/dL — AB
Specific Gravity, Urine: 1.02 (ref 1.005–1.030)
pH: 5 (ref 5.0–8.0)

## 2020-10-15 LAB — CBC WITH DIFFERENTIAL/PLATELET
Abs Immature Granulocytes: 0.03 10*3/uL (ref 0.00–0.07)
Basophils Absolute: 0.1 10*3/uL (ref 0.0–0.1)
Basophils Relative: 2 %
Eosinophils Absolute: 0 10*3/uL (ref 0.0–0.5)
Eosinophils Relative: 0 %
HCT: 40.2 % (ref 39.0–52.0)
Hemoglobin: 14.1 g/dL (ref 13.0–17.0)
Immature Granulocytes: 0 %
Lymphocytes Relative: 28 %
Lymphs Abs: 2 10*3/uL (ref 0.7–4.0)
MCH: 34.1 pg — ABNORMAL HIGH (ref 26.0–34.0)
MCHC: 35.1 g/dL (ref 30.0–36.0)
MCV: 97.3 fL (ref 80.0–100.0)
Monocytes Absolute: 0.9 10*3/uL (ref 0.1–1.0)
Monocytes Relative: 13 %
Neutro Abs: 4.1 10*3/uL (ref 1.7–7.7)
Neutrophils Relative %: 57 %
Platelets: 97 10*3/uL — ABNORMAL LOW (ref 150–400)
RBC: 4.13 MIL/uL — ABNORMAL LOW (ref 4.22–5.81)
RDW: 12.8 % (ref 11.5–15.5)
WBC: 7.3 10*3/uL (ref 4.0–10.5)
nRBC: 0 % (ref 0.0–0.2)

## 2020-10-15 LAB — ETHANOL: Alcohol, Ethyl (B): 348 mg/dL (ref <10)

## 2020-10-15 LAB — CBG MONITORING, ED: Glucose-Capillary: 84 mg/dL (ref 70–99)

## 2020-10-15 MED ORDER — LEVETIRACETAM 500 MG PO TABS: 500.0000 mg | ORAL_TABLET | Freq: Two times a day (BID) | ORAL | 0 refills | Status: DC

## 2020-10-15 MED ORDER — SODIUM CHLORIDE 0.9 % IV BOLUS
1000.0000 mL | Freq: Once | INTRAVENOUS | Status: AC
  Administered 2020-10-15: 1000 mL via INTRAVENOUS

## 2020-10-15 MED ORDER — SODIUM CHLORIDE 0.9 % IV SOLN: INTRAVENOUS | Status: DC

## 2020-10-15 MED ORDER — LEVETIRACETAM IN NACL 1000 MG/100ML IV SOLN
1000.0000 mg | Freq: Once | INTRAVENOUS | Status: AC
  Administered 2020-10-15: 1000 mg via INTRAVENOUS
  Filled 2020-10-15: qty 100

## 2020-10-15 NOTE — ED Notes (Signed)
Pt ambulated independently without difficulty. PA aware.

## 2020-10-15 NOTE — ED Notes (Signed)
ED Provider at bedside. 

## 2020-10-15 NOTE — ED Notes (Signed)
Patient transported to CT 

## 2020-10-15 NOTE — ED Notes (Signed)
Pt sleeping in lobby 

## 2020-10-15 NOTE — ED Notes (Signed)
Ginger ale provided.

## 2020-10-15 NOTE — Discharge Instructions (Addendum)
You need to be taking Keppra.  This will help you prevent having seizures in the future.  You are prescribed this twice a day.  I have given you a new prescription.  Please follow-up with your neurologist.  Return to the emergency department with any new or worsening symptoms.

## 2020-10-15 NOTE — ED Provider Notes (Addendum)
[facility]-EMERGENCY DEPT Provider Note   CSN: [id number] Arrival date & time: 10/14/20  2213     History Chief Complaint  Patient presents with  . Seizures    Dwayne Bolton is a 44 y.o. male.  HPI Patient is a 44 year old male with a history of alcohol abuse as well as seizure disorder.  He presents the emergency department today due to a possible seizure.  He states that he was at a grocery store and began to feel funny.  He states he fell forward landing on his face.  Notes pain and swelling to the lower lip.  He is unsure of how long he lost consciousness for.  Patient notes bowel and bladder incontinence during this episode of LOC.  He confirms his history of seizure disorder and denies taking his regular Keppra.  States he used to take lorazepam but has been off of this medication for many months.  Besides lip pain, he states he feels fatigued but otherwise has no other acute complaints.  No fevers, chills, nuchal rigidity, chest pain, shortness of breath, abdominal pain, numbness, tingling, weakness.    Past Medical History:  Diagnosis Date  . Alcohol abuse   . Seizure [facility])     Patient Active Problem List   Diagnosis Date Noted  . Tobacco dependence 12/31/2015  . Seizure disorder (HCC) 12/31/2015  . Convulsion (HCC)   . Alcohol withdrawal seizure (HCC) 03/22/2015  . GAD (generalized anxiety disorder) 01/13/2014  . Adjustment disorder with mixed anxiety and depressed mood 01/13/2014  . Seizures (HCC) 01/12/2014  . Elevated liver enzymes 01/12/2014  . Alcohol dependence (HCC) 01/11/2014    History reviewed. No pertinent surgical history.     Family History  Problem Relation Age of Onset  . Diabetes Mother   . Hypertension Mother     Social History   Tobacco Use  . Smoking status: Current Every Day Smoker    Packs/day: 0.25    Years: 4.00    Pack years: 1.00    Types: Cigarettes  . Smokeless tobacco: Former Clinical biochemist  .  Vaping Use: Never used  Substance Use Topics  . Alcohol use: Yes  . Drug use: No    Home Medications Prior to Admission medications   Medication Sig Start Date End Date Taking? Authorizing Provider  chlordiazePOXIDE (LIBRIUM) 25 MG capsule 50mg  PO TID x 1D, then 25-50mg  PO BID X 1D, then 25-50mg  PO QD X 1D 02/12/20   02/14/20, PA-C  ibuprofen (ADVIL) 600 MG tablet Take 1 tablet (600 mg total) by mouth every 6 (six) hours as needed. 03/13/20   03/15/20, PA-C  levETIRAcetam (KEPPRA) 500 MG tablet Take 1 tablet (500 mg total) by mouth 2 (two) times daily. 04/09/20   Khatri, Hina, PA-C  lidocaine (LIDODERM) 5 % Place 1 patch onto the skin daily. Remove & Discard patch within 12 hours or as directed by MD 01/20/20   Mesner, 03/21/20, MD  methocarbamol (ROBAXIN) 500 MG tablet Take 1 tablet (500 mg total) by mouth 2 (two) times daily. Patient not taking: Reported on 02/11/2020 01/20/20   Mesner, 03/21/20, MD    Allergies    Patient has no known allergies.  Review of Systems   Review of Systems  All other systems reviewed and are negative. Ten systems reviewed and are negative for acute change, except as noted in the HPI.   Physical Exam Updated Vital Signs BP (!) 121/91 (BP Location: Left Arm)   Pulse  88   Temp 99.3 F (37.4 C) (Oral)   Resp 16   Ht 5\' 11"  (1.803 m)   Wt 72.6 kg   SpO2 98%   BMI 22.32 kg/m   Physical Exam Vitals and nursing note reviewed.  Constitutional:      General: He is not in acute distress.    Appearance: Normal appearance. He is not ill-appearing, toxic-appearing or diaphoretic.  HENT:     Head: Normocephalic.     Right Ear: External ear normal.     Left Ear: External ear normal.     Nose: Nose normal.     Mouth/Throat:     Mouth: Mucous membranes are moist.     Pharynx: Oropharynx is clear. No oropharyngeal exudate or posterior oropharyngeal erythema.     Comments: Mild swelling and tenderness noted to the left lower lip.  No malocclusion.  No obvious  trauma or lacerations noted to the tongue. Eyes:     Extraocular Movements: Extraocular movements intact.  Cardiovascular:     Rate and Rhythm: Normal rate and regular rhythm.     Pulses: Normal pulses.     Heart sounds: Normal heart sounds. No murmur heard. No friction rub. No gallop.   Pulmonary:     Effort: Pulmonary effort is normal. No respiratory distress.     Breath sounds: Normal breath sounds. No stridor. No wheezing, rhonchi or rales.  Abdominal:     General: Abdomen is flat.     Tenderness: There is no abdominal tenderness.  Musculoskeletal:        General: Normal range of motion.     Cervical back: Normal range of motion and neck supple. No tenderness.  Skin:    General: Skin is warm and dry.  Neurological:     General: No focal deficit present.     Mental Status: He is alert and oriented to person, place, and time.     Comments: Patient is oriented to person, place, and time. Patient phonates in clear, complete, and coherent sentences. Negative arm drift. Strength is 5/5 in all four extremities. Distal sensation intact in all four extremities.  No tremor appreciated with the arms fully extended.  Psychiatric:        Mood and Affect: Mood normal.        Behavior: Behavior normal.    ED Results / Procedures / Treatments   Labs (all labs ordered are listed, but only abnormal results are displayed) Labs Reviewed  COMPREHENSIVE METABOLIC PANEL - Abnormal; Notable for the following components:      Result Value   Potassium 5.8 (*)    Calcium 8.6 (*)    AST 198 (*)    ALT 102 (*)    Alkaline Phosphatase 129 (*)    All other components within normal limits  CBC WITH DIFFERENTIAL/PLATELET - Abnormal; Notable for the following components:   RBC 4.13 (*)    MCH 34.1 (*)    Platelets 97 (*)    All other components within normal limits  URINALYSIS, ROUTINE W REFLEX MICROSCOPIC - Abnormal; Notable for the following components:   Hgb urine dipstick MODERATE (*)     Protein, ur 100 (*)    Bacteria, UA RARE (*)    All other components within normal limits  ETHANOL - Abnormal; Notable for the following components:   Alcohol, Ethyl (B) 348 (*)    All other components within normal limits  RAPID URINE DRUG SCREEN, HOSP PERFORMED  CBG MONITORING, ED   EKG  EKG Interpretation  Date/Time:  Monday October 15 2020 05:52:31 EST Ventricular Rate:  81 PR Interval:    QRS Duration: 101 QT Interval:  392 QTC Calculation: 455 R Axis:   47 Text Interpretation: Sinus rhythm Atrial premature complex Probable anteroseptal infarct, old No significant change since last tracing Confirmed by Marily Memos [phone]) on 10/15/2020 6:15:28 AM  Radiology CT Head Wo Contrast  Result Date: 10/15/2020 CLINICAL DATA:  44 year old male status post seizure. EXAM: CT HEAD WITHOUT CONTRAST TECHNIQUE: Contiguous axial images were obtained from the base of the skull through the vertex without intravenous contrast. COMPARISON:  Brain MRI 03/23/2015.  Head CT 04/09/2020 and earlier. FINDINGS: Brain: Generalized chronic cerebral volume loss for age is stable. No midline shift, ventriculomegaly, mass effect, evidence of mass lesion, intracranial hemorrhage or evidence of cortically based acute infarction. Gray-white matter differentiation is within normal limits throughout the brain. Gray-white matter differentiation is stable, within normal limits. Vascular: Mild Calcified atherosclerosis at the skull base. No suspicious intracranial vascular hyperdensity. Skull: Stable, intact. Sinuses/Orbits: Moderate to severe maxillary sinus mucosal thickening is new since June. Other Visualized paranasal sinuses and mastoids are stable and well pneumatized. Other: No acute orbit or scalp soft tissue injury identified. IMPRESSION: 1. No acute intracranial abnormality or acute traumatic injury identified. Chronic generalized cerebral volume loss. 2. New bilateral maxillary sinus mucosal thickening.  Electronically Signed   By: Odessa Fleming M.D.   On: 10/15/2020 04:53    Procedures Procedures (including critical care time)  Medications Ordered in ED Medications  sodium chloride 0.9 % bolus 1,000 mL (0 mLs Intravenous Stopped 10/15/20 0513)    And  0.9 %  sodium chloride infusion ( Intravenous New Bag/Given 10/15/20 0359)  levETIRAcetam (KEPPRA) IVPB 1000 mg/100 mL premix (0 mg Intravenous Stopped 10/15/20 0513)    ED Course  I have reviewed the triage vital signs and the nursing notes.  Pertinent labs & imaging results that were available during my care of the patient were reviewed by me and considered in my medical decision making (see chart for details).  Clinical Course as of 10/15/20 6045  [facility] Oct 15, 2020  0406 Nursing staff obtained a CIWA score.  CIWA 5. [LJ]  0446 Alcohol, Ethyl (B)(!!): 348 [LJ]  0511 CT Head Wo Contrast IMPRESSION: 1. No acute intracranial abnormality or acute traumatic injury identified. Chronic generalized cerebral volume loss. 2. New bilateral maxillary sinus mucosal thickening. [LJ]    Clinical Course User Index [LJ] Dwayne Bolton   MDM Rules/Calculators/A&P                          Patient is a 44 year old male who presents to the emergency department due to alcohol intoxication as well as a possible seizure.  CT obtained of the head showing no acute intracranial abnormalities. No injuries. Significantly elevated ethyl alcohol at 348. Seems to be consistent with patient's prior levels. CIWA 5. Given his elevated ethyl alcohol levels as well as CIWA score, doubt his symptoms are related to alcohol withdrawal.  No anion gap.  Doubt alcoholic ketoacidosis at this time.  CMP showing mildly elevated hyperkalemia at 5.8. Hypocalcemia at 8.6. AST at 198 and ALT at 102. History of prior elevated LFTs. Consistent with his chronic alcohol abuse. CBC without leukocytosis.  Hemoglobin within normal limits.  Thrombocytopenia of 97. UDS reassuring.    Patient has been monitored in the emergency department for many hours with no recurrent seizure-like episodes. He is  alert and oriented. His neurological exam is benign. He has ambulated throughout the emergency department and been p.o. challenged. He was loaded with 1 g of Keppra. Discharged with a new prescription for Keppra. Recommended neurology follow-up. Return to the ER with any new or worsening symptoms. His questions were answered and he was amicable at the time of discharge. His vital signs are stable.  Final Clinical Impression(s) / ED Diagnoses Final diagnoses:  Breakthrough seizure (HCC)  Alcohol abuse   Rx / DC Orders ED Discharge Orders         Ordered    levETIRAcetam (KEPPRA) 500 MG tablet  2 times daily        10/15/20 0537           Placido SouJoldersma, Labarron Durnin, PA-C 10/15/20 0613    Placido SouJoldersma, Aliveah Gallant, PA-C 10/15/20 [id number]    Mesner, Barbara CowerJason, MD 10/15/20 [phone]

## 2020-10-28 ENCOUNTER — Emergency Department (HOSPITAL_COMMUNITY)
Admission: EM | Admit: 2020-10-28 | Discharge: 2020-10-29 | Disposition: A | Discharge status: Home / Self Care | Payer: Self-pay | Attending: Emergency Medicine | Admitting: Emergency Medicine

## 2020-10-28 ENCOUNTER — Emergency Department (HOSPITAL_COMMUNITY)
Admission: EM | Admit: 2020-10-28 | Discharge: 2020-10-28 | Disposition: A | Discharge status: Home / Self Care | Payer: Self-pay | Attending: Emergency Medicine | Admitting: Emergency Medicine

## 2020-10-28 ENCOUNTER — Other Ambulatory Visit: Payer: Self-pay

## 2020-10-28 ENCOUNTER — Emergency Department (HOSPITAL_COMMUNITY): Payer: Self-pay

## 2020-10-28 DIAGNOSIS — R112 Nausea with vomiting, unspecified: Secondary | ICD-10-CM | POA: Insufficient documentation

## 2020-10-28 DIAGNOSIS — Z5321 Procedure and treatment not carried out due to patient leaving prior to being seen by health care provider: Secondary | ICD-10-CM | POA: Insufficient documentation

## 2020-10-28 DIAGNOSIS — R1110 Vomiting, unspecified: Secondary | ICD-10-CM | POA: Insufficient documentation

## 2020-10-28 DIAGNOSIS — R0981 Nasal congestion: Secondary | ICD-10-CM | POA: Insufficient documentation

## 2020-10-28 DIAGNOSIS — R569 Unspecified convulsions: Secondary | ICD-10-CM | POA: Insufficient documentation

## 2020-10-28 DIAGNOSIS — R197 Diarrhea, unspecified: Secondary | ICD-10-CM | POA: Insufficient documentation

## 2020-10-28 NOTE — ED Notes (Signed)
I called patient to recheck his vitals and no one responded

## 2020-10-28 NOTE — ED Triage Notes (Signed)
Pt arrives via EMS from [employer], was in the bathroom for 2 hours. Told PD and EMS he likely had another seizure. Seen here yesterday for same. Didn't have money to pick up keppra. VSS. Ambulatory, NAD at present.

## 2020-10-28 NOTE — ED Notes (Signed)
Called pt for vitals and no answer  

## 2020-10-28 NOTE — ED Notes (Signed)
I called patient for vitals and no one answer

## 2020-10-28 NOTE — ED Triage Notes (Signed)
Pt came in via EMS with c/o emesis and nasal congestion times two weeks. Pt is homeless

## 2020-10-29 NOTE — ED Notes (Signed)
Called pt multiple times and no answer  

## 2020-11-05 ENCOUNTER — Encounter (HOSPITAL_COMMUNITY): Payer: Self-pay | Admitting: Obstetrics and Gynecology

## 2020-11-05 ENCOUNTER — Emergency Department (HOSPITAL_COMMUNITY)
Admission: EM | Admit: 2020-11-05 | Discharge: 2020-11-05 | Disposition: A | Discharge status: Home / Self Care | Attending: Emergency Medicine | Admitting: Emergency Medicine

## 2020-11-05 ENCOUNTER — Other Ambulatory Visit: Payer: Self-pay | Admitting: Emergency Medicine

## 2020-11-05 ENCOUNTER — Other Ambulatory Visit: Payer: Self-pay

## 2020-11-05 DIAGNOSIS — Z8669 Personal history of other diseases of the nervous system and sense organs: Secondary | ICD-10-CM | POA: Insufficient documentation

## 2020-11-05 DIAGNOSIS — S0012XA Contusion of left eyelid and periocular area, initial encounter: Secondary | ICD-10-CM | POA: Diagnosis not present

## 2020-11-05 DIAGNOSIS — F17210 Nicotine dependence, cigarettes, uncomplicated: Secondary | ICD-10-CM | POA: Insufficient documentation

## 2020-11-05 DIAGNOSIS — W19XXXA Unspecified fall, initial encounter: Secondary | ICD-10-CM | POA: Diagnosis not present

## 2020-11-05 DIAGNOSIS — R569 Unspecified convulsions: Secondary | ICD-10-CM | POA: Diagnosis not present

## 2020-11-05 DIAGNOSIS — S0993XA Unspecified injury of face, initial encounter: Secondary | ICD-10-CM | POA: Diagnosis present

## 2020-11-05 MED ORDER — LEVETIRACETAM IN NACL 1000 MG/100ML IV SOLN
1000.0000 mg | Freq: Once | INTRAVENOUS | Status: AC
  Administered 2020-11-05: 1000 mg via INTRAVENOUS
  Filled 2020-11-05: qty 100

## 2020-11-05 MED ORDER — LEVETIRACETAM 500 MG PO TABS: 500.0000 mg | ORAL_TABLET | Freq: Two times a day (BID) | ORAL | 0 refills | Status: DC

## 2020-11-05 MED ORDER — CHLORDIAZEPOXIDE HCL 25 MG PO CAPS
100.0000 mg | ORAL_CAPSULE | Freq: Once | ORAL | Status: AC
  Administered 2020-11-05: 100 mg via ORAL
  Filled 2020-11-05: qty 4

## 2020-11-05 MED ORDER — CHLORDIAZEPOXIDE HCL 25 MG PO CAPS: ORAL_CAPSULE | ORAL | 0 refills | Status: DC

## 2020-11-05 MED FILL — levETIRAcetam 500 MG TABS: 500 | 30 days supply | Qty: 60 | Fill #0

## 2020-11-05 MED FILL — CHLORDIAZEPOXIDE 25 MG CAP: 25 | 3 days supply | Qty: 10 | Fill #0

## 2020-11-05 NOTE — ED Triage Notes (Signed)
[facility] for seizure. Patient alert and oriented at this time. Seen for same this past month.

## 2020-11-05 NOTE — ED Provider Notes (Signed)
[facility]-EMERGENCY DEPT Provider Note   CSN: [id number] Arrival date & time: 11/05/20  0857     History Chief Complaint  Patient presents with  . Seizures    Dwayne Bolton is a 45 y.o. male.  45 yo M with a history of seizure disorder with a chief complaint of multiple seizures.  The patient is currently incarcerated.  States that he has not been taking his medications because he cannot afford them.  Takes Keppra and Ativan.  He thinks he has had about 10-15 seizures in the past few days.  He thinks he hurts all over from frequent falls due to seizure activity.  The history is provided by the patient.  Seizures Seizure activity on arrival: no   Seizure type:  Grand mal Initial focality:  None Episode characteristics: generalized shaking   Postictal symptoms: confusion   Return to baseline: yes   Severity:  Moderate Duration:  2 minutes Timing:  Once Progression:  Worsening Recent head injury:  No recent head injuries PTA treatment:  None History of seizures: no        Past Medical History:  Diagnosis Date  . Alcohol abuse   . Seizure [facility])     Patient Active Problem List   Diagnosis Date Noted  . Tobacco dependence 12/31/2015  . Seizure disorder (HCC) 12/31/2015  . Convulsion (HCC)   . Alcohol withdrawal seizure (HCC) 03/22/2015  . GAD (generalized anxiety disorder) 01/13/2014  . Adjustment disorder with mixed anxiety and depressed mood 01/13/2014  . Seizures (HCC) 01/12/2014  . Elevated liver enzymes 01/12/2014  . Alcohol dependence (HCC) 01/11/2014    History reviewed. No pertinent surgical history.     Family History  Problem Relation Age of Onset  . Diabetes Mother   . Hypertension Mother     Social History   Tobacco Use  . Smoking status: Current Every Day Smoker    Packs/day: 0.25    Years: 4.00    Pack years: 1.00    Types: Cigarettes  . Smokeless tobacco: Former Clinical biochemist  . Vaping Use: Never used   Substance Use Topics  . Alcohol use: Yes  . Drug use: No    Home Medications Prior to Admission medications   Medication Sig Start Date End Date Taking? Authorizing Provider  chlordiazePOXIDE (LIBRIUM) 25 MG capsule 50mg  PO TID x 1D, then 25-50mg  PO BID X 1D, then 25-50mg  PO QD X 1D 11/05/20   11/07/20, DO  ibuprofen (ADVIL) 600 MG tablet Take 1 tablet (600 mg total) by mouth every 6 (six) hours as needed. 03/13/20   03/15/20, PA-C  levETIRAcetam (KEPPRA) 500 MG tablet Take 1 tablet (500 mg total) by mouth 2 (two) times daily. 11/05/20   11/07/20, DO  lidocaine (LIDODERM) 5 % Place 1 patch onto the skin daily. Remove & Discard patch within 12 hours or as directed by MD 01/20/20   Mesner, 03/21/20, MD  methocarbamol (ROBAXIN) 500 MG tablet Take 1 tablet (500 mg total) by mouth 2 (two) times daily. Patient not taking: Reported on 02/11/2020 01/20/20   Mesner, 03/21/20, MD    Allergies    Patient has no known allergies.  Review of Systems   Review of Systems  Constitutional: Negative for chills and fever.  HENT: Negative for congestion and facial swelling.   Eyes: Negative for discharge and visual disturbance.  Respiratory: Negative for shortness of breath.   Cardiovascular: Negative for chest pain and palpitations.  Gastrointestinal: Negative  for abdominal pain, diarrhea and vomiting.  Musculoskeletal: Negative for arthralgias and myalgias.  Skin: Negative for color change and rash.  Neurological: Positive for seizures. Negative for tremors, syncope and headaches.  Psychiatric/Behavioral: Negative for confusion and dysphoric mood.    Physical Exam Updated Vital Signs BP (!) 180/96 (BP Location: Right Arm)   Pulse 79   Temp 99.8 F (37.7 C)   Resp 16   Ht 5\' 11"  (1.803 m)   Wt 79.4 kg   SpO2 98%   BMI 24.41 kg/m   Physical Exam Vitals and nursing note reviewed.  Constitutional:      Appearance: He is well-developed and well-nourished.  HENT:     Head: Normocephalic.      Comments: Left periorbital bruising.  Eyes:     Extraocular Movements: EOM normal.     Pupils: Pupils are equal, round, and reactive to light.  Neck:     Vascular: No JVD.  Cardiovascular:     Rate and Rhythm: Normal rate and regular rhythm.     Heart sounds: No murmur heard. No friction rub. No gallop.   Pulmonary:     Effort: No respiratory distress.     Breath sounds: No wheezing.  Abdominal:     General: There is no distension.     Tenderness: There is no abdominal tenderness. There is no guarding or rebound.  Musculoskeletal:        General: Normal range of motion.     Cervical back: Normal range of motion and neck supple.  Skin:    Coloration: Skin is not pale.     Findings: No rash.  Neurological:     Mental Status: He is alert and oriented to person, place, and time.  Psychiatric:        Mood and Affect: Mood and affect normal.        Behavior: Behavior normal.     ED Results / Procedures / Treatments   Labs (all labs ordered are listed, but only abnormal results are displayed) Labs Reviewed - No data to display  EKG None  Radiology No results found.  Procedures Procedures (including critical care time)  Medications Ordered in ED Medications  chlordiazePOXIDE (LIBRIUM) capsule 100 mg (100 mg Oral Given 11/05/20 0943)  levETIRAcetam (KEPPRA) IVPB 1000 mg/100 mL premix (1,000 mg Intravenous New Bag/Given 11/05/20 0941)    ED Course  I have reviewed the triage vital signs and the nursing notes.  Pertinent labs & imaging results that were available during my care of the patient were reviewed by me and considered in my medical decision making (see chart for details).    MDM Rules/Calculators/A&P                          45 yo M with a chief complaints of recurrent seizure activity.  Patient has not been taking his seizure medication and has had a breakthrough seizure.  Back to baseline currently.  On physical exam no obvious areas that need imaging.  We  will load him with Keppra here.  Started back on Keppra.  Have him follow-up with his neurologist.  10:31 AM:  I have discussed the diagnosis/risks/treatment options with the patient and believe the pt to be eligible for discharge home to follow-up with PCP. We also discussed returning to the ED immediately if new or worsening sx occur. We discussed the sx which are most concerning (e.g., sudden worsening pain, fever, inability to tolerate by  mouth) that necessitate immediate return. Medications administered to the patient during their visit and any new prescriptions provided to the patient are listed below.  Medications given during this visit Medications  chlordiazePOXIDE (LIBRIUM) capsule 100 mg (100 mg Oral Given 11/05/20 0943)  levETIRAcetam (KEPPRA) IVPB 1000 mg/100 mL premix (1,000 mg Intravenous New Bag/Given 11/05/20 0941)     The patient appears reasonably screen and/or stabilized for discharge and I doubt any other medical condition or other [facility] requiring further screening, evaluation, or treatment in the ED at this time prior to discharge.   Final Clinical Impression(s) / ED Diagnoses Final diagnoses:  Seizure (HCC)    Rx / DC Orders ED Discharge Orders         Ordered    Ambulatory referral to Neurology       Comments: An appointment is requested in approximately: 2 weeks   11/05/20 0959    chlordiazePOXIDE (LIBRIUM) 25 MG capsule        11/05/20 0959    levETIRAcetam (KEPPRA) 500 MG tablet  2 times daily        11/05/20 0959           Melene Plan, DO 11/05/20 1031

## 2020-11-05 NOTE — Discharge Instructions (Addendum)
Follow up with your neurologist in the office. You should not drive a motor vehicle for 6 mos post seizure.  No swimming, scuba diving, climbing to tall heights or any other activity which if you had a seizure could be dangerous for you or others.   Our records indicate that you get your [facility]. This should provide you with most of your prescriptions. I am not sure why you could not afford them. Please discuss this with your family physician.

## 2021-02-11 ENCOUNTER — Emergency Department (HOSPITAL_COMMUNITY)

## 2021-02-11 ENCOUNTER — Emergency Department (HOSPITAL_COMMUNITY)
Admission: EM | Admit: 2021-02-11 | Discharge: 2021-02-11 | Disposition: A | Discharge status: Home / Self Care | Attending: Emergency Medicine | Admitting: Emergency Medicine

## 2021-02-11 ENCOUNTER — Other Ambulatory Visit: Payer: Self-pay

## 2021-02-11 DIAGNOSIS — G40909 Epilepsy, unspecified, not intractable, without status epilepticus: Secondary | ICD-10-CM | POA: Insufficient documentation

## 2021-02-11 DIAGNOSIS — F17210 Nicotine dependence, cigarettes, uncomplicated: Secondary | ICD-10-CM | POA: Insufficient documentation

## 2021-02-11 DIAGNOSIS — S6992XA Unspecified injury of left wrist, hand and finger(s), initial encounter: Secondary | ICD-10-CM | POA: Insufficient documentation

## 2021-02-11 DIAGNOSIS — W228XXA Striking against or struck by other objects, initial encounter: Secondary | ICD-10-CM | POA: Insufficient documentation

## 2021-02-11 DIAGNOSIS — R569 Unspecified convulsions: Secondary | ICD-10-CM

## 2021-02-11 DIAGNOSIS — Y9289 Other specified places as the place of occurrence of the external cause: Secondary | ICD-10-CM | POA: Insufficient documentation

## 2021-02-11 LAB — BASIC METABOLIC PANEL
Anion gap: 11 (ref 5–15)
BUN: 9 mg/dL (ref 6–20)
CO2: 26 mmol/L (ref 22–32)
Calcium: 9.6 mg/dL (ref 8.9–10.3)
Chloride: 100 mmol/L (ref 98–111)
Creatinine, Ser: 0.96 mg/dL (ref 0.61–1.24)
GFR, Estimated: >60 mL/min (ref >60)
Glucose, Bld: 99 mg/dL (ref 70–99)
Potassium: 4.7 mmol/L (ref 3.5–5.1)
Sodium: 137 mmol/L (ref 135–145)

## 2021-02-11 LAB — CBC WITH DIFFERENTIAL/PLATELET
Abs Immature Granulocytes: 0.06 10*3/uL (ref 0.00–0.07)
Basophils Absolute: 0.1 10*3/uL (ref 0.0–0.1)
Basophils Relative: 1 %
Eosinophils Absolute: 0.3 10*3/uL (ref 0.0–0.5)
Eosinophils Relative: 3 %
HCT: 42.7 % (ref 39.0–52.0)
Hemoglobin: 15.4 g/dL (ref 13.0–17.0)
Immature Granulocytes: 1 %
Lymphocytes Relative: 29 %
Lymphs Abs: 2.7 10*3/uL (ref 0.7–4.0)
MCH: 33.6 pg (ref 26.0–34.0)
MCHC: 36.1 g/dL — ABNORMAL HIGH (ref 30.0–36.0)
MCV: 93 fL (ref 80.0–100.0)
Monocytes Absolute: 1.1 10*3/uL — ABNORMAL HIGH (ref 0.1–1.0)
Monocytes Relative: 11 %
Neutro Abs: 5.2 10*3/uL (ref 1.7–7.7)
Neutrophils Relative %: 55 %
Platelets: 292 10*3/uL (ref 150–400)
RBC: 4.59 MIL/uL (ref 4.22–5.81)
RDW: 12.6 % (ref 11.5–15.5)
WBC: 9.4 10*3/uL (ref 4.0–10.5)
nRBC: 0 % (ref 0.0–0.2)

## 2021-02-11 MED ORDER — LEVETIRACETAM 750 MG PO TABS: 750.0000 mg | ORAL_TABLET | Freq: Two times a day (BID) | ORAL | 0 refills | Status: DC

## 2021-02-11 MED ORDER — LEVETIRACETAM 500 MG PO TABS
1500.0000 mg | ORAL_TABLET | Freq: Once | ORAL | Status: AC
  Administered 2021-02-11: 1500 mg via ORAL
  Filled 2021-02-11: qty 3

## 2021-02-11 NOTE — ED Notes (Signed)
Pt reports witnessed seizure by his brother. Pt says he normally bites through his tongue when he isn't on his keppra but denies that happening today. Pt denies head injury. Pt reports an aura. Pt reports he drove himself here. Pt reports some seizures are him standing and and spinning and others are his arms drawn up and shaking, and others are he feels like he can't move and then blacks out. Pt reports today was a can't move black out one.

## 2021-02-11 NOTE — ED Triage Notes (Signed)
Emergency Medicine Provider Triage Evaluation Note  Dwayne Bolton , a 45 y.o. male  was evaluated in triage.  Pt complains of seizure that occurred prior to arrival that was witnessed by brother. Is currently on Keppra; however, hasn't taken it since Tuesday or Wednesday of last week. He notes recent change in his medication from 500mg  to 750mg  BID. Most recent seizure February 2022. on his left hand and complaining on left index finger pain.   Review of Systems  Positive: seizure   Physical Exam  BP 116/79 (BP Location: Right Arm)   Pulse 93   Temp 98.9 F (37.2 C) (Oral)   Resp 16   SpO2 100%  Gen:   Awake, no distress   HEENT:  Atraumatic  Resp:  Normal effort  Cardiac:  Normal rate  Abd:   Nondistended, nontender  MSK:   Tenderness throughout left index finger with surrounding edema and decreased ROM Neuro:  drowsy  Medical Decision Making  Medically screening exam initiated at 12:36 PM.  Appropriate orders placed.  Dwayne Bolton was informed that the remainder of the evaluation will be completed by another provider, this initial triage assessment does not replace that evaluation, and the importance of remaining in the ED until their evaluation is complete.  Clinical Impression  Witnessed seizure. History of same. Medication noncompliance most likely cause of seizure. Labs and keppra level. X-ray left index finger to rule out bony fracture   Larey Seat, PA-C 02/11/21 1239

## 2021-02-11 NOTE — ED Provider Notes (Signed)
[facility] EMERGENCY DEPARTMENT Provider Note   CSN: [id number] Arrival date & time: 02/11/21  1201     History Chief Complaint  Patient presents with  . Seizures    Jdyn Parkerson is a 45 y.o. male.  Johanna Stafford is a 45 y.o. male with a past medical history of seizures that presents today following a witnesses seizure. Patient states he was sitting on the bed and got a sensation that he "couldn't focus on anything" and he and his brother, who was with him, realized what was happening and the patient got on the floor. Patient said his brother witnessed the seizure and reported he did not hit his head and did not bite his tongue during the seizure. Patient did endorse hitting his left index finger on a dresser while he was getting in the floor. Patient is usually managed on Keppra but has been unable to take it since last week because he was unable to fill his prescription. Denies alcohol or drug use. Reports he now feels back to baseline, and is just tired.        Past Medical History:  Diagnosis Date  . Alcohol abuse   . Seizure [facility])     Patient Active Problem List   Diagnosis Date Noted  . Tobacco dependence 12/31/2015  . Seizure disorder (HCC) 12/31/2015  . Convulsion (HCC)   . Alcohol withdrawal seizure (HCC) 03/22/2015  . GAD (generalized anxiety disorder) 01/13/2014  . Adjustment disorder with mixed anxiety and depressed mood 01/13/2014  . Seizures (HCC) 01/12/2014  . Elevated liver enzymes 01/12/2014  . Alcohol dependence (HCC) 01/11/2014    No past surgical history on file.     Family History  Problem Relation Age of Onset  . Diabetes Mother   . Hypertension Mother     Social History   Tobacco Use  . Smoking status: Current Every Day Smoker    Packs/day: 0.25    Years: 4.00    Pack years: 1.00    Types: Cigarettes  . Smokeless tobacco: Former Clinical biochemist  . Vaping Use: Never used  Substance Use Topics  . Alcohol use:  Yes  . Drug use: No    Home Medications Prior to Admission medications   Medication Sig Start Date End Date Taking? Authorizing Provider  chlordiazePOXIDE (LIBRIUM) 25 MG capsule 50mg  PO TID x 1D, then 25-50mg  PO BID X 1D, then 25-50mg  PO QD X 1D 11/05/20   11/07/20, DO  ibuprofen (ADVIL) 600 MG tablet Take 1 tablet (600 mg total) by mouth every 6 (six) hours as needed. 03/13/20   03/15/20, PA-C  levETIRAcetam (KEPPRA) 750 MG tablet Take 1 tablet (750 mg total) by mouth 2 (two) times daily. 02/11/21   02/13/21, PA-C  lidocaine (LIDODERM) 5 % Place 1 patch onto the skin daily. Remove & Discard patch within 12 hours or as directed by MD 01/20/20   Mesner, 03/21/20, MD  methocarbamol (ROBAXIN) 500 MG tablet Take 1 tablet (500 mg total) by mouth 2 (two) times daily. Patient not taking: Reported on 02/11/2020 01/20/20   Mesner, 03/21/20, MD    Allergies    Patient has no known allergies.  Review of Systems   Review of Systems  Constitutional: Negative for chills and fever.  HENT: Negative.   Eyes: Negative for visual disturbance.  Respiratory: Negative for cough and shortness of breath.   Cardiovascular: Negative for chest pain.  Gastrointestinal: Negative for abdominal pain, nausea and vomiting.  Genitourinary: Negative for dysuria.  Musculoskeletal: Positive for arthralgias.  Skin: Negative for color change and wound.  Neurological: Positive for seizures. Negative for weakness, numbness and headaches.  All other systems reviewed and are negative.   Physical Exam Updated Vital Signs BP 119/83 (BP Location: Right Arm)   Pulse 69   Temp 98.6 F (37 C) (Oral)   Resp 17   Ht 5\' 11"  (1.803 m)   Wt 79.4 kg   SpO2 100%   BMI 24.41 kg/m   Physical Exam Vitals and nursing note reviewed.  Constitutional:      General: He is not in acute distress.    Appearance: Normal appearance. He is well-developed and normal weight. He is not ill-appearing or diaphoretic.  HENT:     Head:  Normocephalic and atraumatic.     Comments: No hematoma, step-off or deformity.    Mouth/Throat:     Mouth: Mucous membranes are moist.     Pharynx: Oropharynx is clear.  Eyes:     General:        Right eye: No discharge.        Left eye: No discharge.     Extraocular Movements: Extraocular movements intact.     Pupils: Pupils are equal, round, and reactive to light.  Neck:     Comments: No midline tenderness Cardiovascular:     Rate and Rhythm: Normal rate and regular rhythm.     Heart sounds: Normal heart sounds. No murmur heard. No friction rub. No gallop.   Pulmonary:     Effort: Pulmonary effort is normal. No respiratory distress.     Breath sounds: Normal breath sounds. No wheezing or rales.     Comments: Respirations equal and unlabored, patient able to speak in full sentences, lungs clear to auscultation bilaterally  Abdominal:     General: Bowel sounds are normal. There is no distension.     Palpations: Abdomen is soft. There is no mass.     Tenderness: There is no abdominal tenderness. There is no guarding.  Musculoskeletal:        General: Tenderness present. No deformity.     Cervical back: Neck supple.     Comments: Tenderness and mild swelling over the PIP joint of the left index finger, patient able to flex and extend the finger with some pain, normal sensation and cap refill  Skin:    General: Skin is warm and dry.     Capillary Refill: Capillary refill takes less than 2 seconds.  Neurological:     Mental Status: He is alert.     Coordination: Coordination normal.     Comments: Speech is clear, able to follow commands CN III-XII intact Normal strength in upper and lower extremities bilaterally including dorsiflexion and plantar flexion, strong and equal grip strength Sensation normal to light and sharp touch Moves extremities without ataxia, coordination intact  Psychiatric:        Behavior: Behavior normal.     ED Results / Procedures / Treatments    Labs (all labs ordered are listed, but only abnormal results are displayed) Labs Reviewed  CBC WITH DIFFERENTIAL/PLATELET - Abnormal; Notable for the following components:      Result Value   MCHC 36.1 (*)    Monocytes Absolute 1.1 (*)    All other components within normal limits  BASIC METABOLIC PANEL  LEVETIRACETAM LEVEL    EKG EKG Interpretation  Date/Time:  Monday February 11 2021 12:42:37 EDT Ventricular Rate:  79 PR Interval:  188 QRS Duration: 80 QT Interval:  374 QTC Calculation: 428 R Axis:   61 Text Interpretation: Normal sinus rhythm Normal ECG No significant change since last tracing Confirmed by Melene Plan [phone]) on 02/11/2021 2:04:46 PM   Radiology DG Finger Index Left  Result Date: 02/11/2021 CLINICAL DATA:  Seizure.  Pain and swelling PIP joint index finger EXAM: LEFT INDEX FINGER 2+V COMPARISON:  11/13/2020 FINDINGS: Normal alignment no fracture.  No arthropathy Soft tissue swelling around the PIP joint. This has progressed since the prior study. IMPRESSION: Progressive soft tissue swelling PIP of the index finger. Negative for fracture. Electronically Signed   By: Marlan Palau M.D.   On: 02/11/2021 13:14    Procedures Procedures   Medications Ordered in ED Medications  levETIRAcetam (KEPPRA) tablet 1,500 mg (1,500 mg Oral Given 02/11/21 1459)    ED Course  I have reviewed the triage vital signs and the nursing notes.  Pertinent labs & imaging results that were available during my care of the patient were reviewed by me and considered in my medical decision making (see chart for details).    MDM Rules/Calculators/A&P                         45 year old male presents after witnessed seizure by his brother, known history of seizures.  Has not had his Keppra for about 1 week due to issues getting his prescription filled.  Suspect this is the reason for his breakthrough seizure, he has prior history of medication noncompliance.  Did not fall to the ground  during the seizure.  No focal neurologic deficits and is currently back at baseline.  Does complain of pain to the left index finger that he reports he hit when trying to lay down before the seizure came on.  Slight swelling, x-ray negative.  Likely sprain.  I have independently ordered, reviewed and interpreted all labs and imaging:  Lab work is overall unremarkable, no significant electrolyte derangements or leukocytosis.  Will give patient oral loading dose of Keppra and I have represcribed his Keppra today and stressed the importance of taking this.  Splint applied to the finger.  Discussed supportive care and follow-up if pain in the finger is not improving.  At this time patient is stable for discharge home  Final Clinical Impression(s) / ED Diagnoses Final diagnoses:  Seizure (HCC)  Injury of left index finger, initial encounter    Rx / DC Orders ED Discharge Orders         Ordered    levETIRAcetam (KEPPRA) 750 MG tablet  2 times daily        02/11/21 1544           Legrand Rams 02/11/21 2228    Melene Plan, DO 02/12/21 1502

## 2021-02-11 NOTE — ED Notes (Signed)
Reviewed discharge instructions with patient. Follow-up care and medications reviewed. Patient  verbalized understanding. Patient A&Ox4, VSS, and ambulatory with steady gait upon discharge.  °

## 2021-02-11 NOTE — ED Triage Notes (Signed)
Pt reports he had a seizure witness by is brother. Pt reports that he has h/o. Pt reports he is on keppra but has not had any since wednesday.

## 2021-02-11 NOTE — Discharge Instructions (Addendum)
Please take your Keppra twice daily as directed, avoid alcohol or drugs as this can increase risk for additional seizures.  Follow-up with your regular doctor and neurologist.  Use splint and Tylenol or ibuprofen to help with finger pain from injury, your x-ray showed no fracture today, if pain is not improving in 1 week follow-up with hand specialist.

## 2021-02-14 LAB — LEVETIRACETAM LEVEL: Levetiracetam Lvl: 29.5 ug/mL (ref 10.0–40.0)

## 2021-03-15 ENCOUNTER — Emergency Department (HOSPITAL_COMMUNITY)
Admission: EM | Admit: 2021-03-15 | Discharge: 2021-03-15 | Disposition: A | Discharge status: Home / Self Care | Attending: Emergency Medicine | Admitting: Emergency Medicine

## 2021-03-15 ENCOUNTER — Encounter (HOSPITAL_COMMUNITY): Payer: Self-pay

## 2021-03-15 ENCOUNTER — Other Ambulatory Visit: Payer: Self-pay

## 2021-03-15 DIAGNOSIS — G40909 Epilepsy, unspecified, not intractable, without status epilepticus: Secondary | ICD-10-CM | POA: Insufficient documentation

## 2021-03-15 DIAGNOSIS — R569 Unspecified convulsions: Secondary | ICD-10-CM

## 2021-03-15 DIAGNOSIS — F17210 Nicotine dependence, cigarettes, uncomplicated: Secondary | ICD-10-CM | POA: Insufficient documentation

## 2021-03-15 DIAGNOSIS — Z79899 Other long term (current) drug therapy: Secondary | ICD-10-CM | POA: Insufficient documentation

## 2021-03-15 LAB — BASIC METABOLIC PANEL
Anion gap: 8 (ref 5–15)
BUN: 11 mg/dL (ref 6–20)
CO2: 24 mmol/L (ref 22–32)
Calcium: 9.7 mg/dL (ref 8.9–10.3)
Chloride: 104 mmol/L (ref 98–111)
Creatinine, Ser: 0.98 mg/dL (ref 0.61–1.24)
GFR, Estimated: >60 mL/min (ref >60)
Glucose, Bld: 98 mg/dL (ref 70–99)
Potassium: 4.2 mmol/L (ref 3.5–5.1)
Sodium: 136 mmol/L (ref 135–145)

## 2021-03-15 LAB — CBC
HCT: 47.2 % (ref 39.0–52.0)
Hemoglobin: 16.1 g/dL (ref 13.0–17.0)
MCH: 30.9 pg (ref 26.0–34.0)
MCHC: 34.1 g/dL (ref 30.0–36.0)
MCV: 90.6 fL (ref 80.0–100.0)
Platelets: 321 10*3/uL (ref 150–400)
RBC: 5.21 MIL/uL (ref 4.22–5.81)
RDW: 12.8 % (ref 11.5–15.5)
WBC: 10.2 10*3/uL (ref 4.0–10.5)
nRBC: 0 % (ref 0.0–0.2)

## 2021-03-15 MED ORDER — LEVETIRACETAM 750 MG PO TABS: 750.0000 mg | ORAL_TABLET | Freq: Two times a day (BID) | ORAL | 0 refills | Status: DC

## 2021-03-15 NOTE — ED Triage Notes (Signed)
Pt arrives via POV with complaints of having a seizure and being out of Keppra medication. Pt home alone in bed. States he had a seizure around 0230 03/15/21. He has been out of his medication 4 days.

## 2021-03-15 NOTE — Discharge Instructions (Signed)
Take your seizure medicine.  You must follow-up with a primary care doctor or a neurologist.  Do not miss any doses of your seizure medicine going forward.  Return to ER for additional seizures or other new concern.

## 2021-03-15 NOTE — ED Notes (Signed)
Pt ambulatory with steady gait to restroom 

## 2021-03-15 NOTE — ED Provider Notes (Signed)
Emergency Medicine Provider Triage Evaluation Note  Chayne Baumgart , a 45 y.o. male  was evaluated in triage.  Pt complains of seizures  Review of Systems  Positive: weakness Negative: Cough or fever  Physical Exam  BP 120/82 (BP Location: Left Arm)   Pulse 83   Temp 98.3 F (36.8 C)   Resp 18   SpO2 97%  Gen:   Awake, no distress   Resp:  Normal effort  MSK:   Moves extremities without difficulty  Other:    Medical Decision Making  Medically screening exam initiated at 11:13 AM.  Appropriate orders placed.  Roi Jafari was informed that the remainder of the evaluation will be completed by another provider, this initial triage assessment does not replace that evaluation, and the importance of remaining in the ED until their evaluation is complete.     Elson Areas, [place] 03/15/21 1114    Derwood Kaplan, MD 03/18/21 [phone]

## 2021-03-15 NOTE — ED Provider Notes (Signed)
[facility] EMERGENCY DEPARTMENT Provider Note   CSN: [id number] Arrival date & time: 03/15/21  1043     History Chief Complaint  Patient presents with  . Seizures    Dwayne Bolton is a 45 y.o. male.  Patient reports around 2 or 3 AM this morning he thinks he had a seizure episode.  Woke up from his sleep and thinks that he had some shaking movements.  No bladder or bowel incontinence.  No tongue injury.  Felt a little funny afterwards but does not feel confused now.  No further episodes.  His last dose of Keppra was few days ago.  States that he does not have a neurologist, does [facility] but has not been seen recently.  Denies any acute complaints at present.  HPI     Past Medical History:  Diagnosis Date  . Alcohol abuse   . Seizure [facility])     Patient Active Problem List   Diagnosis Date Noted  . Tobacco dependence 12/31/2015  . Seizure disorder (HCC) 12/31/2015  . Convulsion (HCC)   . Alcohol withdrawal seizure (HCC) 03/22/2015  . GAD (generalized anxiety disorder) 01/13/2014  . Adjustment disorder with mixed anxiety and depressed mood 01/13/2014  . Seizures (HCC) 01/12/2014  . Elevated liver enzymes 01/12/2014  . Alcohol dependence (HCC) 01/11/2014    History reviewed. No pertinent surgical history.     Family History  Problem Relation Age of Onset  . Diabetes Mother   . Hypertension Mother     Social History   Tobacco Use  . Smoking status: Current Every Day Smoker    Packs/day: 0.25    Years: 4.00    Pack years: 1.00    Types: Cigarettes  . Smokeless tobacco: Former Clinical biochemist  . Vaping Use: Never used  Substance Use Topics  . Alcohol use: Yes  . Drug use: No    Home Medications Prior to Admission medications   Medication Sig Start Date End Date Taking? Authorizing Provider  chlordiazePOXIDE (LIBRIUM) 25 MG capsule 50mg  PO TID x 1D, then 25-50mg  PO BID X 1D, then  25-50mg  PO QD X 1D 11/05/20   11/07/20, DO  chlordiazePOXIDE (LIBRIUM) 25 MG capsule TAKE 2 TABLETS BY MOUTH THREE TIMES DAILY FOR ONE DAY THEN 1-2 TABLETS TWICE DAILY FOR ONE DAY THEN 1-2 ONCE DAILY FOR ONE DAY 11/05/20 05/06/21  05/08/21, DO  ibuprofen (ADVIL) 600 MG tablet Take 1 tablet (600 mg total) by mouth every 6 (six) hours as needed. 03/13/20   03/15/20, PA-C  levETIRAcetam (KEPPRA) 500 MG tablet TAKE 1 TABLET (500 MG TOTAL) BY MOUTH 2 (TWO) TIMES DAILY. 11/05/20 11/05/21  11/07/21, DO  levETIRAcetam (KEPPRA) 750 MG tablet Take 1 tablet (750 mg total) by mouth 2 (two) times daily. 03/15/21   03/17/21, MD  lidocaine (LIDODERM) 5 % Place 1 patch onto the skin daily. Remove & Discard patch within 12 hours or as directed by MD 01/20/20   Mesner, 03/21/20, MD  methocarbamol (ROBAXIN) 500 MG tablet Take 1 tablet (500 mg total) by mouth 2 (two) times daily. Patient not taking: Reported on 02/11/2020 01/20/20   Mesner, 03/21/20, MD    Allergies    Patient has no known allergies.  Review of Systems   Review of Systems  Constitutional: Negative for chills and fever.  HENT: Negative for ear pain and sore throat.   Eyes: Negative for pain and visual  disturbance.  Respiratory: Negative for cough and shortness of breath.   Cardiovascular: Negative for chest pain and palpitations.  Gastrointestinal: Negative for abdominal pain and vomiting.  Genitourinary: Negative for dysuria and hematuria.  Musculoskeletal: Negative for arthralgias and back pain.  Skin: Negative for color change and rash.  Neurological: Positive for seizures. Negative for syncope.  All other systems reviewed and are negative.   Physical Exam Updated Vital Signs BP 102/81 (BP Location: Left Arm)   Pulse 74   Temp 98.3 F (36.8 C)   Resp 16   Ht 5\' 11"  (1.803 m)   Wt 88.5 kg   SpO2 99%   BMI 27.20 kg/m   Physical Exam Vitals and nursing note reviewed.  Constitutional:      Appearance: He is well-developed.   HENT:     Head: Normocephalic and atraumatic.  Eyes:     Conjunctiva/sclera: Conjunctivae normal.  Cardiovascular:     Rate and Rhythm: Normal rate and regular rhythm.     Heart sounds: No murmur heard.   Pulmonary:     Effort: Pulmonary effort is normal. No respiratory distress.     Breath sounds: Normal breath sounds.  Abdominal:     Palpations: Abdomen is soft.     Tenderness: There is no abdominal tenderness.  Musculoskeletal:     Cervical back: Neck supple.  Skin:    General: Skin is warm and dry.  Neurological:     General: No focal deficit present.     Mental Status: He is alert and oriented to person, place, and time.     ED Results / Procedures / Treatments   Labs (all labs ordered are listed, but only abnormal results are displayed) Labs Reviewed  CBC  BASIC METABOLIC PANEL    EKG None  Radiology No results found.  Procedures Procedures   Medications Ordered in ED Medications - No data to display  ED Course  I have reviewed the triage vital signs and the nursing notes.  Pertinent labs & imaging results that were available during my care of the patient were reviewed by me and considered in my medical decision making (see chart for details).    MDM Rules/Calculators/A&P                         45 year old male with history of seizure disorder presents to ER with concern for possible seizure episode this morning.  Notably patient reports that he has been out of his Keppra for the last few days.  He appears well his labs are stable and his vitals are stable.  Will discharge home.  Provided 1 month supply of Keppra.  Stressed need to follow-up with either a neurologist or his primary care doctor and stressed importance of not missing any doses.  Offered to provide dose in ER before leaving but patient insistent on leaving at present.    After the discussed management above, the patient was determined to be safe for discharge.  The patient was in  agreement with this plan and all questions regarding their care were answered.  ED return precautions were discussed and the patient will return to the ED with any significant worsening of condition.     Final Clinical Impression(s) / ED Diagnoses Final diagnoses:  Seizure (HCC)    Rx / DC Orders ED Discharge Orders         Ordered    levETIRAcetam (KEPPRA) 750 MG tablet  2 times daily,  Status:  Discontinued        03/15/21 1522    levETIRAcetam (KEPPRA) 750 MG tablet  2 times daily        03/15/21 1526           Milagros Loll, MD 03/15/21 1529

## 2021-05-25 ENCOUNTER — Encounter (HOSPITAL_COMMUNITY): Payer: Self-pay | Admitting: Emergency Medicine

## 2021-05-25 ENCOUNTER — Emergency Department (HOSPITAL_COMMUNITY): Payer: Self-pay

## 2021-05-25 ENCOUNTER — Other Ambulatory Visit: Payer: Self-pay

## 2021-05-25 ENCOUNTER — Emergency Department (HOSPITAL_COMMUNITY)
Admission: EM | Admit: 2021-05-25 | Discharge: 2021-05-26 | Disposition: A | Discharge status: Home / Self Care | Payer: Self-pay | Attending: Emergency Medicine | Admitting: Emergency Medicine

## 2021-05-25 DIAGNOSIS — R4182 Altered mental status, unspecified: Secondary | ICD-10-CM | POA: Insufficient documentation

## 2021-05-25 DIAGNOSIS — Z5321 Procedure and treatment not carried out due to patient leaving prior to being seen by health care provider: Secondary | ICD-10-CM | POA: Insufficient documentation

## 2021-05-25 DIAGNOSIS — Y908 Blood alcohol level of 240 mg/100 ml or more: Secondary | ICD-10-CM | POA: Insufficient documentation

## 2021-05-25 DIAGNOSIS — F10129 Alcohol abuse with intoxication, unspecified: Secondary | ICD-10-CM | POA: Insufficient documentation

## 2021-05-25 LAB — COMPREHENSIVE METABOLIC PANEL
ALT: 236 U/L — ABNORMAL HIGH (ref 0–44)
AST: 287 U/L — ABNORMAL HIGH (ref 15–41)
Albumin: 4.7 g/dL (ref 3.5–5.0)
Alkaline Phosphatase: 94 U/L (ref 38–126)
Anion gap: 12 (ref 5–15)
BUN: 6 mg/dL (ref 6–20)
CO2: 28 mmol/L (ref 22–32)
Calcium: 9 mg/dL (ref 8.9–10.3)
Chloride: 99 mmol/L (ref 98–111)
Creatinine, Ser: 1.03 mg/dL (ref 0.61–1.24)
GFR, Estimated: >60 mL/min (ref >60)
Glucose, Bld: 111 mg/dL — ABNORMAL HIGH (ref 70–99)
Potassium: 3.6 mmol/L (ref 3.5–5.1)
Sodium: 139 mmol/L (ref 135–145)
Total Bilirubin: 1.1 mg/dL (ref 0.3–1.2)
Total Protein: 7.9 g/dL (ref 6.5–8.1)

## 2021-05-25 LAB — RAPID URINE DRUG SCREEN, HOSP PERFORMED
Amphetamines: NOT DETECTED
Barbiturates: NOT DETECTED
Benzodiazepines: NOT DETECTED
Cocaine: NOT DETECTED
Opiates: NOT DETECTED
Tetrahydrocannabinol: NOT DETECTED

## 2021-05-25 LAB — AMMONIA: Ammonia: 28 umol/L (ref 9–35)

## 2021-05-25 LAB — SALICYLATE LEVEL: Salicylate Lvl: <7 mg/dL — ABNORMAL LOW (ref 7.0–30.0)

## 2021-05-25 LAB — CBC WITH DIFFERENTIAL/PLATELET
Abs Immature Granulocytes: 0.03 10*3/uL (ref 0.00–0.07)
Basophils Absolute: 0.1 10*3/uL (ref 0.0–0.1)
Basophils Relative: 2 %
Eosinophils Absolute: 0.1 10*3/uL (ref 0.0–0.5)
Eosinophils Relative: 2 %
HCT: 42 % (ref 39.0–52.0)
Hemoglobin: 14.7 g/dL (ref 13.0–17.0)
Immature Granulocytes: 1 %
Lymphocytes Relative: 22 %
Lymphs Abs: 1.3 10*3/uL (ref 0.7–4.0)
MCH: 32 pg (ref 26.0–34.0)
MCHC: 35 g/dL (ref 30.0–36.0)
MCV: 91.3 fL (ref 80.0–100.0)
Monocytes Absolute: 0.7 10*3/uL (ref 0.1–1.0)
Monocytes Relative: 12 %
Neutro Abs: 3.6 10*3/uL (ref 1.7–7.7)
Neutrophils Relative %: 61 %
Platelets: 148 10*3/uL — ABNORMAL LOW (ref 150–400)
RBC: 4.6 MIL/uL (ref 4.22–5.81)
RDW: 14.2 % (ref 11.5–15.5)
WBC: 6 10*3/uL (ref 4.0–10.5)
nRBC: 0 % (ref 0.0–0.2)

## 2021-05-25 LAB — ACETAMINOPHEN LEVEL: Acetaminophen (Tylenol), Serum: <10 ug/mL — ABNORMAL LOW (ref 10–30)

## 2021-05-25 LAB — ETHANOL: Alcohol, Ethyl (B): 473 mg/dL (ref <10)

## 2021-05-25 LAB — MAGNESIUM: Magnesium: 2.2 mg/dL (ref 1.7–2.4)

## 2021-05-25 NOTE — ED Triage Notes (Signed)
Pt to triage via [facility] from [employer].  Pt was found lying in aisle at [employer].  + ETOH.  States he was partying with his friends and they wanted to go to [employer] and he went to [employer].  States after he walked in everything "went black".  History of seizures.  Takes Keppra. C/o lower back pain.

## 2021-05-25 NOTE — ED Provider Notes (Signed)
Emergency Medicine Provider Triage Evaluation Note  Dwayne Bolton , a 45 y.o. male  was evaluated in triage.  Pt complains of found lying in a [employer] hallway.  History of seizures and alcohol abuse.  Appears intoxicated.  She is unable to give accurate history.  Review of Systems  Unable to review systems due to altered mental status  Physical Exam  There were no vitals taken for this visit. Gen:   Awake, no distress   Resp:  Normal effort  MSK:   Moves extremities without difficulty  Other:  No obvious head trauma  Medical Decision Making  Medically screening exam initiated at 5:28 PM.  Appropriate orders placed.  Dwayne Bolton was informed that the remainder of the evaluation will be completed by another provider, this initial triage assessment does not replace that evaluation, and the importance of remaining in the ED until their evaluation is complete.  Labs and imaging ordered- ams   Arthor Captain, PA-C 05/25/21 1729    Ernie Avena, MD 05/25/21 [phone]

## 2021-05-26 NOTE — ED Notes (Signed)
Pt called x3 for vital recheck. No response.  

## 2021-05-28 ENCOUNTER — Other Ambulatory Visit: Payer: Self-pay

## 2021-05-28 MED FILL — Levetiracetam Tab 500 MG: ORAL | 30 days supply | Qty: 60 | Fill #0 | Status: CN

## 2021-06-04 ENCOUNTER — Other Ambulatory Visit: Payer: Self-pay

## 2021-07-09 ENCOUNTER — Inpatient Hospital Stay (HOSPITAL_COMMUNITY)
Admission: EM | Admit: 2021-07-09 | Discharge: 2021-07-12 | DRG: 101 | Disposition: A | Discharge status: Home / Self Care | Payer: Self-pay | Attending: Internal Medicine | Admitting: Internal Medicine

## 2021-07-09 DIAGNOSIS — F10930 Alcohol use, unspecified with withdrawal, uncomplicated: Secondary | ICD-10-CM

## 2021-07-09 DIAGNOSIS — Z8249 Family history of ischemic heart disease and other diseases of the circulatory system: Secondary | ICD-10-CM

## 2021-07-09 DIAGNOSIS — F10239 Alcohol dependence with withdrawal, unspecified: Secondary | ICD-10-CM

## 2021-07-09 DIAGNOSIS — E876 Hypokalemia: Secondary | ICD-10-CM | POA: Diagnosis present

## 2021-07-09 DIAGNOSIS — R8271 Bacteriuria: Secondary | ICD-10-CM | POA: Diagnosis present

## 2021-07-09 DIAGNOSIS — E860 Dehydration: Secondary | ICD-10-CM | POA: Diagnosis present

## 2021-07-09 DIAGNOSIS — Z833 Family history of diabetes mellitus: Secondary | ICD-10-CM

## 2021-07-09 DIAGNOSIS — T426X6A Underdosing of other antiepileptic and sedative-hypnotic drugs, initial encounter: Secondary | ICD-10-CM | POA: Diagnosis present

## 2021-07-09 DIAGNOSIS — F10231 Alcohol dependence with withdrawal delirium: Secondary | ICD-10-CM | POA: Diagnosis present

## 2021-07-09 DIAGNOSIS — Z20822 Contact with and (suspected) exposure to covid-19: Secondary | ICD-10-CM | POA: Diagnosis present

## 2021-07-09 DIAGNOSIS — E871 Hypo-osmolality and hyponatremia: Secondary | ICD-10-CM | POA: Diagnosis present

## 2021-07-09 DIAGNOSIS — N390 Urinary tract infection, site not specified: Secondary | ICD-10-CM

## 2021-07-09 DIAGNOSIS — G40909 Epilepsy, unspecified, not intractable, without status epilepticus: Principal | ICD-10-CM | POA: Diagnosis present

## 2021-07-09 DIAGNOSIS — F10931 Alcohol use, unspecified with withdrawal delirium: Secondary | ICD-10-CM | POA: Diagnosis present

## 2021-07-09 DIAGNOSIS — R569 Unspecified convulsions: Secondary | ICD-10-CM

## 2021-07-09 DIAGNOSIS — F17210 Nicotine dependence, cigarettes, uncomplicated: Secondary | ICD-10-CM | POA: Diagnosis present

## 2021-07-09 DIAGNOSIS — F17200 Nicotine dependence, unspecified, uncomplicated: Secondary | ICD-10-CM

## 2021-07-09 DIAGNOSIS — Z91120 Patient's intentional underdosing of medication regimen due to financial hardship: Secondary | ICD-10-CM

## 2021-07-09 DIAGNOSIS — R748 Abnormal levels of other serum enzymes: Secondary | ICD-10-CM

## 2021-07-09 DIAGNOSIS — F411 Generalized anxiety disorder: Secondary | ICD-10-CM | POA: Diagnosis present

## 2021-07-09 DIAGNOSIS — R7401 Elevation of levels of liver transaminase levels: Secondary | ICD-10-CM | POA: Diagnosis present

## 2021-07-09 DIAGNOSIS — Z79899 Other long term (current) drug therapy: Secondary | ICD-10-CM

## 2021-07-09 DIAGNOSIS — F10230 Alcohol dependence with withdrawal, uncomplicated: Secondary | ICD-10-CM

## 2021-07-09 LAB — CBC WITH DIFFERENTIAL/PLATELET
Abs Immature Granulocytes: 0.07 10*3/uL (ref 0.00–0.07)
Basophils Absolute: 0 10*3/uL (ref 0.0–0.1)
Basophils Relative: 0 %
Eosinophils Absolute: 0 10*3/uL (ref 0.0–0.5)
Eosinophils Relative: 0 %
HCT: 40.5 % (ref 39.0–52.0)
Hemoglobin: 14.7 g/dL (ref 13.0–17.0)
Immature Granulocytes: 1 %
Lymphocytes Relative: 5 %
Lymphs Abs: 0.5 10*3/uL — ABNORMAL LOW (ref 0.7–4.0)
MCH: 33.1 pg (ref 26.0–34.0)
MCHC: 36.3 g/dL — ABNORMAL HIGH (ref 30.0–36.0)
MCV: 91.2 fL (ref 80.0–100.0)
Monocytes Absolute: 1.3 10*3/uL — ABNORMAL HIGH (ref 0.1–1.0)
Monocytes Relative: 14 %
Neutro Abs: 7.2 10*3/uL (ref 1.7–7.7)
Neutrophils Relative %: 80 %
Platelets: 152 10*3/uL (ref 150–400)
RBC: 4.44 MIL/uL (ref 4.22–5.81)
RDW: 13.6 % (ref 11.5–15.5)
WBC: 9.1 10*3/uL (ref 4.0–10.5)
nRBC: 0 % (ref 0.0–0.2)

## 2021-07-09 LAB — COMPREHENSIVE METABOLIC PANEL
ALT: 105 U/L — ABNORMAL HIGH (ref 0–44)
AST: 112 U/L — ABNORMAL HIGH (ref 15–41)
Albumin: 5.3 g/dL — ABNORMAL HIGH (ref 3.5–5.0)
Alkaline Phosphatase: 97 U/L (ref 38–126)
Anion gap: >20 — ABNORMAL HIGH (ref 5–15)
BUN: 18 mg/dL (ref 6–20)
CO2: 27 mmol/L (ref 22–32)
Calcium: 10.3 mg/dL (ref 8.9–10.3)
Chloride: 81 mmol/L — ABNORMAL LOW (ref 98–111)
Creatinine, Ser: 0.94 mg/dL (ref 0.61–1.24)
GFR, Estimated: >60 mL/min (ref >60)
Glucose, Bld: 134 mg/dL — ABNORMAL HIGH (ref 70–99)
Potassium: 3.3 mmol/L — ABNORMAL LOW (ref 3.5–5.1)
Sodium: 133 mmol/L — ABNORMAL LOW (ref 135–145)
Total Bilirubin: 2.2 mg/dL — ABNORMAL HIGH (ref 0.3–1.2)
Total Protein: 8.9 g/dL — ABNORMAL HIGH (ref 6.5–8.1)

## 2021-07-09 LAB — URINALYSIS, ROUTINE W REFLEX MICROSCOPIC
Glucose, UA: NEGATIVE mg/dL
Ketones, ur: >80 mg/dL — AB
Leukocytes,Ua: NEGATIVE
Nitrite: POSITIVE — AB
Protein, ur: >300 mg/dL — AB
Specific Gravity, Urine: >1.03 — ABNORMAL HIGH (ref 1.005–1.030)
pH: 6 (ref 5.0–8.0)

## 2021-07-09 LAB — RAPID URINE DRUG SCREEN, HOSP PERFORMED
Amphetamines: NOT DETECTED
Barbiturates: NOT DETECTED
Benzodiazepines: NOT DETECTED
Cocaine: NOT DETECTED
Opiates: NOT DETECTED
Tetrahydrocannabinol: NOT DETECTED

## 2021-07-09 LAB — AMMONIA: Ammonia: 18 umol/L (ref 9–35)

## 2021-07-09 LAB — PROTIME-INR
INR: 1 (ref 0.8–1.2)
Prothrombin Time: 12.7 seconds (ref 11.4–15.2)

## 2021-07-09 LAB — MAGNESIUM: Magnesium: 2.7 mg/dL — ABNORMAL HIGH (ref 1.7–2.4)

## 2021-07-09 LAB — ETHANOL: Alcohol, Ethyl (B): <10 mg/dL (ref <10)

## 2021-07-09 LAB — URINALYSIS, MICROSCOPIC (REFLEX)
RBC / HPF: >50 RBC/hpf (ref 0–5)
WBC, UA: >50 WBC/hpf (ref 0–5)

## 2021-07-09 LAB — RESP PANEL BY RT-PCR (FLU A&B, COVID) ARPGX2
Influenza A by PCR: NEGATIVE
Influenza B by PCR: NEGATIVE
SARS Coronavirus 2 by RT PCR: NEGATIVE

## 2021-07-09 LAB — CK: Total CK: 501 U/L — ABNORMAL HIGH (ref 49–397)

## 2021-07-09 LAB — PHOSPHORUS: Phosphorus: 4.5 mg/dL (ref 2.5–4.6)

## 2021-07-09 LAB — LACTIC ACID, PLASMA: Lactic Acid, Venous: 1.9 mmol/L (ref 0.5–1.9)

## 2021-07-09 LAB — BETA-HYDROXYBUTYRIC ACID: Beta-Hydroxybutyric Acid: 1.95 mmol/L — ABNORMAL HIGH (ref 0.05–0.27)

## 2021-07-09 MED ORDER — LEVETIRACETAM 500 MG PO TABS
750.0000 mg | ORAL_TABLET | Freq: Once | ORAL | Status: AC
  Administered 2021-07-09: 750 mg via ORAL
  Filled 2021-07-09: qty 1

## 2021-07-09 MED ORDER — MAGNESIUM OXIDE -MG SUPPLEMENT 400 (240 MG) MG PO TABS
400.0000 mg | ORAL_TABLET | Freq: Once | ORAL | Status: AC
  Administered 2021-07-09: 400 mg via ORAL
  Filled 2021-07-09: qty 1

## 2021-07-09 MED ORDER — POTASSIUM CHLORIDE CRYS ER 20 MEQ PO TBCR: 40.0000 meq | EXTENDED_RELEASE_TABLET | Freq: Once | ORAL | Status: DC

## 2021-07-09 MED ORDER — LORAZEPAM 1 MG PO TABS
0.0000 mg | ORAL_TABLET | Freq: Four times a day (QID) | ORAL | Status: AC
  Administered 2021-07-09 – 2021-07-10 (×6): 1 mg via ORAL
  Filled 2021-07-09 (×6): qty 1

## 2021-07-09 MED ORDER — SODIUM CHLORIDE 0.9 % IV SOLN
1.0000 g | INTRAVENOUS | Status: DC
  Administered 2021-07-10 (×2): 1 g via INTRAVENOUS
  Filled 2021-07-09 (×2): qty 10

## 2021-07-09 MED ORDER — LACTATED RINGERS IV SOLN: INTRAVENOUS | Status: DC

## 2021-07-09 MED ORDER — LORAZEPAM 1 MG PO TABS
0.0000 mg | ORAL_TABLET | Freq: Two times a day (BID) | ORAL | Status: DC
  Administered 2021-07-11: 1 mg via ORAL
  Filled 2021-07-09: qty 1

## 2021-07-09 MED ORDER — CHLORDIAZEPOXIDE HCL 25 MG PO CAPS: ORAL_CAPSULE | ORAL | 0 refills | Status: DC

## 2021-07-09 MED ORDER — LORAZEPAM 2 MG/ML IJ SOLN: 0.0000 mg | Freq: Two times a day (BID) | INTRAMUSCULAR | Status: DC

## 2021-07-09 MED ORDER — POTASSIUM CHLORIDE CRYS ER 20 MEQ PO TBCR
20.0000 meq | EXTENDED_RELEASE_TABLET | Freq: Once | ORAL | Status: AC
  Administered 2021-07-09: 20 meq via ORAL
  Filled 2021-07-09: qty 1

## 2021-07-09 MED ORDER — BENZOCAINE 10 % MT GEL
Freq: Three times a day (TID) | OROMUCOSAL | Status: DC | PRN
  Administered 2021-07-09: 1 via OROMUCOSAL
  Filled 2021-07-09 (×3): qty 9

## 2021-07-09 MED ORDER — SODIUM CHLORIDE 0.9 % IV SOLN: 75.0000 mL/h | INTRAVENOUS | Status: DC

## 2021-07-09 MED ORDER — THIAMINE HCL 100 MG/ML IJ SOLN
100.0000 mg | Freq: Every day | INTRAMUSCULAR | Status: DC
Start: 2021-07-09 — End: 2021-07-12
  Administered 2021-07-12: 100 mg via INTRAVENOUS
  Filled 2021-07-09 (×2): qty 2

## 2021-07-09 MED ORDER — LORAZEPAM 2 MG/ML IJ SOLN: 0.0000 mg | Freq: Four times a day (QID) | INTRAMUSCULAR | Status: AC

## 2021-07-09 MED ORDER — THIAMINE HCL 100 MG PO TABS
100.0000 mg | ORAL_TABLET | Freq: Every day | ORAL | Status: DC
  Administered 2021-07-09 – 2021-07-11 (×3): 100 mg via ORAL
  Filled 2021-07-09 (×4): qty 1

## 2021-07-09 MED ORDER — LEVETIRACETAM 750 MG PO TABS: 750.0000 mg | ORAL_TABLET | Freq: Two times a day (BID) | ORAL | 0 refills | Status: DC

## 2021-07-09 MED ORDER — NICOTINE 21 MG/24HR TD PT24
21.0000 mg | MEDICATED_PATCH | Freq: Every day | TRANSDERMAL | Status: DC
  Administered 2021-07-10 – 2021-07-12 (×3): 21 mg via TRANSDERMAL
  Filled 2021-07-09 (×3): qty 1

## 2021-07-09 NOTE — ED Provider Notes (Signed)
[facility]-EMERGENCY DEPT Provider Note   CSN: [id number] Arrival date & time: 07/09/21  1416     History Chief Complaint  Patient presents with   Seizures    Dwayne Bolton is a 45 y.o. male.  The history is provided by the patient.  Seizures Seizure activity on arrival: no   Seizure type:  Grand mal Preceding symptoms: no headache and no nausea   Initial focality:  None Episode characteristics: generalized shaking and unresponsiveness   Episode characteristics: no tongue biting   Postictal symptoms: confusion   Return to baseline: yes   Severity:  Moderate Timing:  Once  45 year old male With history of alWith history of alcohol abuse and seizures on Keppracohol abuse and seizures on Keppra presenting to the emergency department after a witnessed seizure.  The patient has a history of seizure disorder and states that he normally takes Ativan and Keppra.  He states that he ran out of his medications and has not been taking them.  He states that he does drink alcohol but not every day.  When he does drink he drinks around a sixpack.  He states that his last drink was 1 week ago.  He denies a history of significant alcohol withdrawal seizures.  He denies any fevers or chills.  He denies any tongue biting or urinary incontinence.  He is not sure how long the seizure lasted for.  CBG on arrival was 134.  Past Medical History:  Diagnosis Date   Alcohol abuse    Seizure [facility])     Patient Active Problem List   Diagnosis Date Noted   Alcohol withdrawal delirium (HCC) 07/09/2021   Acute lower UTI 07/09/2021   Tobacco dependence 12/31/2015   Seizure disorder (HCC) 12/31/2015   Convulsion (HCC)    Alcohol withdrawal seizure (HCC) 03/22/2015   GAD (generalized anxiety disorder) 01/13/2014   Adjustment disorder with mixed anxiety and depressed mood 01/13/2014   Seizures (HCC) 01/12/2014   Elevated liver enzymes 01/12/2014   Alcohol dependence (HCC) 01/11/2014     No past surgical history on file.     Family History  Problem Relation Age of Onset   Diabetes Mother    Hypertension Mother     Social History   Tobacco Use   Smoking status: Every Day    Packs/day: 0.25    Years: 4.00    Pack years: 1.00    Types: Cigarettes   Smokeless tobacco: Former  Building services engineer Use: Never used  Substance Use Topics   Alcohol use: Yes   Drug use: No    Home Medications Prior to Admission medications   Medication Sig Start Date End Date Taking? Authorizing Provider  ibuprofen (ADVIL) 600 MG tablet Take 1 tablet (600 mg total) by mouth every 6 (six) hours as needed. 03/13/20  Yes Fayrene Helper, PA-C  levETIRAcetam (KEPPRA) 750 MG tablet Take 1 tablet (750 mg total) by mouth 2 (two) times daily. 07/09/21 10/07/21 Yes Ernie Avena, MD  thiamine 100 MG tablet Take 100 mg by mouth daily.   Yes [provider]  chlordiazePOXIDE (LIBRIUM) 25 MG capsule 50mg  PO TID x 1D, then 25-50mg  PO BID X 1D, then 25-50mg  PO QD X 1D Patient not taking: No sig reported 07/09/21   07/11/21, MD  lidocaine (LIDODERM) 5 % Place 1 patch onto the skin daily. Remove & Discard patch within 12 hours or as directed by MD Patient not taking: Reported on 07/09/2021 01/20/20   Mesner,  Barbara Cower, MD    Allergies    Patient has no known allergies.  Review of Systems   Review of Systems  Constitutional:  Negative for chills and fever.  HENT:  Negative for ear pain and sore throat.   Eyes:  Negative for pain and visual disturbance.  Respiratory:  Negative for cough and shortness of breath.   Cardiovascular:  Negative for chest pain and palpitations.  Gastrointestinal:  Negative for abdominal pain and vomiting.  Genitourinary:  Negative for dysuria and hematuria.  Musculoskeletal:  Negative for arthralgias and back pain.  Skin:  Negative for color change and rash.  Neurological:  Positive for tremors and seizures. Negative for syncope.  All other systems reviewed  and are negative.  Physical Exam Updated Vital Signs BP 127/86 (BP Location: Left Arm)   Pulse 85   Temp 98.6 F (37 C) (Oral)   Resp 20   SpO2 99%   Physical Exam Vitals and nursing note reviewed.  Constitutional:      General: He is not in acute distress. HENT:     Head: Normocephalic and atraumatic.  Eyes:     Conjunctiva/sclera: Conjunctivae normal.     Pupils: Pupils are equal, round, and reactive to light.  Cardiovascular:     Rate and Rhythm: Normal rate and regular rhythm.  Pulmonary:     Effort: Pulmonary effort is normal. No respiratory distress.  Abdominal:     General: There is no distension.     Tenderness: There is no guarding.  Musculoskeletal:        General: No deformity or signs of injury.     Cervical back: Normal range of motion and neck supple.  Skin:    Findings: No lesion or rash.  Neurological:     General: No focal deficit present.     Mental Status: He is alert. Mental status is at baseline.     Comments: MENTAL STATUS EXAM:    Orientation: Alert and oriented to person, place and time.  Memory: Cooperative, follows commands well.  Language: Speech is clear and language is normal.   CRANIAL NERVES:    CN 2 (Optic): Visual fields intact to confrontation.  CN 3,4,6 (EOM): Pupils equal and reactive to light. Full extraocular eye movement. Rightward beating  unidirectional nystagmus present on lateral gaze.  CN 5 (Trigeminal): Facial sensation is normal, no weakness of masticatory muscles.  CN 7 (Facial): No facial weakness or asymmetry.  CN 8 (Auditory): Auditory acuity grossly normal.  CN 9,10 (Glossophar): The uvula is midline, the palate elevates symmetrically.  CN 11 (spinal access): Normal sternocleidomastoid and trapezius strength.  CN 12 (Hypoglossal): The tongue is midline. No atrophy or fasciculations.Marland Kitchen   MOTOR:  Muscle Strength: 5/5RUE, 5/5LUE, 5/5RLE, 5/5LLE.   COORDINATION:  Mild rremor present.   SENSATION:   Intact to light  touch all four extremities.  GAIT: Gait not assessed     ED Results / Procedures / Treatments   Labs (all labs ordered are listed, but only abnormal results are displayed) Labs Reviewed  COMPREHENSIVE METABOLIC PANEL - Abnormal; Notable for the following components:      Result Value   Sodium 133 (*)    Potassium 3.3 (*)    Chloride 81 (*)    Glucose, Bld 134 (*)    Total Protein 8.9 (*)    Albumin 5.3 (*)    AST 112 (*)    ALT 105 (*)    Total Bilirubin 2.2 (*)    Anion  gap >20 (*)    All other components within normal limits  CBC WITH DIFFERENTIAL/PLATELET - Abnormal; Notable for the following components:   MCHC 36.3 (*)    Lymphs Abs 0.5 (*)    Monocytes Absolute 1.3 (*)    All other components within normal limits  MAGNESIUM - Abnormal; Notable for the following components:   Magnesium 2.7 (*)    All other components within normal limits  CK - Abnormal; Notable for the following components:   Total CK 501 (*)    All other components within normal limits  URINALYSIS, ROUTINE W REFLEX MICROSCOPIC - Abnormal; Notable for the following components:   Color, Urine AMBER (*)    APPearance HAZY (*)    Specific Gravity, Urine >1.030 (*)    Hgb urine dipstick LARGE (*)    Bilirubin Urine MODERATE (*)    Ketones, ur >80 (*)    Protein, ur >300 (*)    Nitrite POSITIVE (*)    All other components within normal limits  URINALYSIS, MICROSCOPIC (REFLEX) - Abnormal; Notable for the following components:   Bacteria, UA RARE (*)    All other components within normal limits  CBC WITH DIFFERENTIAL/PLATELET - Abnormal; Notable for the following components:   RBC 4.21 (*)    HCT 38.2 (*)    MCHC 36.1 (*)    Platelets 143 (*)    nRBC 0.3 (*)    Monocytes Absolute 1.9 (*)    All other components within normal limits  COMPREHENSIVE METABOLIC PANEL - Abnormal; Notable for the following components:   Sodium 133 (*)    Potassium 3.4 (*)    Chloride 84 (*)    CO2 33 (*)    Glucose,  Bld 118 (*)    BUN 22 (*)    Calcium 10.4 (*)    AST 99 (*)    ALT 89 (*)    Total Bilirubin 1.4 (*)    Anion gap 16 (*)    All other components within normal limits  BETA-HYDROXYBUTYRIC ACID - Abnormal; Notable for the following components:   Beta-Hydroxybutyric Acid 1.95 (*)    All other components within normal limits  RESP PANEL BY RT-PCR (FLU A&B, COVID) ARPGX2  URINE CULTURE  ETHANOL  RAPID URINE DRUG SCREEN, HOSP PERFORMED  PROTIME-INR  AMMONIA  LACTIC ACID, PLASMA  PHOSPHORUS  MAGNESIUM  PHOSPHORUS  TSH  HIV ANTIBODY (ROUTINE TESTING W REFLEX)    EKG None  Radiology No results found.  Procedures Procedures   Medications Ordered in ED Medications  LORazepam (ATIVAN) injection 0-4 mg ( Intravenous See Alternative 07/10/21 1049)    Or  LORazepam (ATIVAN) tablet 0-4 mg (1 mg Oral Given 07/10/21 1049)  LORazepam (ATIVAN) injection 0-4 mg (has no administration in time range)    Or  LORazepam (ATIVAN) tablet 0-4 mg (has no administration in time range)  thiamine tablet 100 mg (100 mg Oral Given 07/10/21 1043)    Or  thiamine (B-1) injection 100 mg ( Intravenous See Alternative 07/10/21 1043)  benzocaine (ORAJEL) 10 % mucosal gel (1 application Mouth/Throat Given 07/09/21 1850)  cefTRIAXone (ROCEPHIN) 1 g in sodium chloride 0.9 % 100 mL IVPB (0 g Intravenous Stopped 07/10/21 0050)  lactated ringers infusion ( Intravenous New Bag/Given 07/10/21 0832)  nicotine (NICODERM CQ - dosed in mg/24 hours) patch 21 mg (21 mg Transdermal Patch Applied 07/10/21 1043)  levETIRAcetam (KEPPRA) tablet 750 mg (750 mg Oral Given 07/09/21 1705)  potassium chloride SA (KLOR-CON) CR tablet 20 mEq (  20 mEq Oral Given 07/09/21 1849)  magnesium oxide (MAG-OX) tablet 400 mg (400 mg Oral Given 07/09/21 1849)    ED Course  I have reviewed the triage vital signs and the nursing notes.  Pertinent labs & imaging results that were available during my care of the patient were reviewed by me and  considered in my medical decision making (see chart for details).    MDM Rules/Calculators/A&P                           45 year old male with a history of alcohol abuse, alcohol withdrawal, seizure disorder on Keppra (noncompliant) who presents following a generalized tonic-clonic seizure earlier today.  The patient states that he has not had any alcohol in the past 7 days but feels tremulous and agitated.  CBG was 134.  The patient has been noncompliant with his home Keppra.  He also states that he takes Ativan but I do not see that on his home medication list.   On arrival, the patient was tremulous, otherwise had returned to his neurologic baseline with a reassuring neurologic exam, mildly agitated, vitally stable.  The patient's initial CIWA score was a 5.  He was administered Ativan for elevated CIWA and a 750 mg dose of his home Keppra.  Concern for possible EtOH/benzo withdrawal given the patient's reported history.  Additional concern for seizure in the setting of medication noncompliance.  Patient is since returned to his neurologic baseline and has a reassuring neurologic exam.  The patient does appear to be in mild withdrawal.  The patient was administered thiamine given his history of alcohol abuse.  CBC without a leukocytosis, anemia or platelet abnormality, EtOH level resulted undetectable, CMP resulted significant for mild hyponatremia to 133, hypokalemia to 3.3, hypochloremia to 81, blood glucose 134, AST and ALT elevated to 112 and 105, on chart review these appear to be chronically elevated.  Anion gap elevated to greater than 20, suspect lactic acidosis in the setting of recent seizure.  I do not suspect pseudoseizure as the patient did bite his tongue during the event and has a history of seizure disorder and is on Keppra.   Given the concern for alcohol/benzo withdrawal, seizures, hospitalist medicine was consulted for admission.  Final Clinical Impression(s) / ED Diagnoses Final  diagnoses:  Seizure (HCC)  Alcohol withdrawal syndrome without complication (HCC)    Rx / DC Orders ED Discharge Orders          Ordered    chlordiazePOXIDE (LIBRIUM) 25 MG capsule        07/09/21 1511    levETIRAcetam (KEPPRA) 750 MG tablet  2 times daily        07/09/21 1511             Ernie Avena, MD 07/10/21 1208

## 2021-07-09 NOTE — H&P (Signed)
Dwayne Bolton TDV:[id number] DOB: 17-Mar-1976 DOA: 07/09/2021     PCP: Pcp, No   Outpatient Specialists:      Patient arrived to ER on 07/09/21 at 1416 Referred by Attending Ernie Avena, MD   Patient coming from: home Lives  With family    Chief Complaint:   Chief Complaint  Patient presents with   Seizures    HPI: [name] is a 45 y.o. male with medical history significant of   seizures., alcohol abuse, tobacco abuse GAD  Presented with    witnessed seizure his last drink was 1 week ago and he feels very shaky.  He denies any tongue biting or urinary incontinence.  He has not been able to afford his Keppra and has not had any "a while." Etoh six pack a day but tried to quit and have not ahd any in 1 wk   No prior EtOH withdrawal seizures States both he and his brother has been drinking. He sometimes gets withdrawal seizures when he has been binging and then goes to work and does not drink alcohol. He works as a Estate agent.  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   Has   been vaccinated against COVID    Initial COVID TEST  NEGATIVE   Lab Results  Component Value Date   SARSCOV2NAA NEGATIVE 07/09/2021     Regarding pertinent Chronic problems:  Hx of seizure DO - on Keppra Non compliant While in ER: LFT elevated,  etOH <10,  Given keppra 750 PO  Ativan 1mg  K3.4 given 20 mEQ     ED Triage Vitals  Enc Vitals Group     BP 07/09/21 1435 (!) 147/83     Pulse Rate 07/09/21 1435 71     Resp 07/09/21 1435 14     Temp 07/09/21 1435 98.1 F (36.7 C)     Temp Source 07/09/21 1435 Oral     SpO2 07/09/21 1435 98 %     Weight --      Height --      Head Circumference --      Peak Flow --      Pain Score 07/09/21 1425 0     Pain Loc --      Pain Edu? --      Excl. in GC? --   TMAX(24)@     _________________________________________ Significant initial  Findings: Abnormal Labs Reviewed  COMPREHENSIVE  METABOLIC PANEL - Abnormal; Notable for the following components:      Result Value   Sodium 133 (*)    Potassium 3.3 (*)    Chloride 81 (*)    Glucose, Bld 134 (*)    Total Protein 8.9 (*)    Albumin 5.3 (*)    AST 112 (*)    ALT 105 (*)    Total Bilirubin 2.2 (*)    Anion gap >20 (*)    All other components within normal limits  CBC WITH DIFFERENTIAL/PLATELET - Abnormal; Notable for the following components:   MCHC 36.3 (*)    Lymphs Abs 0.5 (*)    Monocytes Absolute 1.3 (*)    All other components within normal limits   ____________________________________________ Ordered    ECG: not Ordered   The recent clinical data is shown below. Vitals:   07/09/21 1435 07/09/21 1906  BP: (!) 147/83 138/88  Pulse: 71 73  Resp: 14 17  Temp: 98.1 F (36.7 C)   TempSrc: Oral  SpO2: 98% 99%  WBC     Component Value Date/Time   WBC 9.1 07/09/2021 1632   LYMPHSABS 0.5 (L) 07/09/2021 1632   MONOABS 1.3 (H) 07/09/2021 1632   EOSABS 0.0 07/09/2021 1632   BASOSABS 0.0 07/09/2021 1632   Lactic Acid, Venous No results found for: LATICACIDVEN    UA pyuria, increased RBC few bacteria possible UTI    Urine analysis:    Component Value Date/Time   COLORURINE AMBER (A) 07/09/2021 2030   APPEARANCEUR HAZY (A) 07/09/2021 2030   LABSPEC >1.030 (H) 07/09/2021 2030   PHURINE 6.0 07/09/2021 2030   GLUCOSEU NEGATIVE 07/09/2021 2030   HGBUR LARGE (A) 07/09/2021 2030   BILIRUBINUR MODERATE (A) 07/09/2021 2030   KETONESUR >80 (A) 07/09/2021 2030   PROTEINUR >300 (A) 07/09/2021 2030   UROBILINOGEN 0.2 12/28/2015 1159   NITRITE POSITIVE (A) 07/09/2021 2030   LEUKOCYTESUR NEGATIVE 07/09/2021 2030    Results for orders placed or performed during the hospital encounter of 03/22/15  MRSA PCR Screening     Status: None   Collection Time: 03/22/15  9:15 PM   Specimen: Nasopharyngeal  Result Value Ref Range Status   MRSA by PCR NEGATIVE NEGATIVE Final    Comment:        The GeneXpert  MRSA Assay (FDA approved for NASAL specimens only), is one component of a comprehensive MRSA colonization surveillance program. It is not intended to diagnose MRSA infection nor to guide or monitor treatment for MRSA infections.     _______________________________________________ Hospitalist was called for admission for alcohol withdrawal seizures   The following Work up has been ordered so far:  Orders Placed This Encounter  Procedures   Comprehensive metabolic panel   CBC with Differential   Ethanol   Urine rapid drug screen (hosp performed)   Magnesium   Clinical institute withdrawal assessment every 6 hours X 48 hours, then every 12 hours x 48 hours   Notify MD if CIWA-AR is greater than 10 for 2 consecutive assessments.   May administer Ativan PO versus IV if patient is tolerating PO intake well   Vital signs every 6 hours X 48 hours, then per unit protocol   Consult to hospitalist   Following Medications were ordered in ER: Medications  LORazepam (ATIVAN) injection 0-4 mg ( Intravenous See Alternative 07/09/21 1706)    Or  LORazepam (ATIVAN) tablet 0-4 mg (1 mg Oral Given 07/09/21 1706)  LORazepam (ATIVAN) injection 0-4 mg (has no administration in time range)    Or  LORazepam (ATIVAN) tablet 0-4 mg (has no administration in time range)  thiamine tablet 100 mg (100 mg Oral Given 07/09/21 1706)    Or  thiamine (B-1) injection 100 mg ( Intravenous See Alternative 07/09/21 1706)  benzocaine (ORAJEL) 10 % mucosal gel (1 application Mouth/Throat Given 07/09/21 1850)  levETIRAcetam (KEPPRA) tablet 750 mg (750 mg Oral Given 07/09/21 1705)  potassium chloride SA (KLOR-CON) CR tablet 20 mEq (20 mEq Oral Given 07/09/21 1849)  magnesium oxide (MAG-OX) tablet 400 mg (400 mg Oral Given 07/09/21 1849)        Consult Orders  (From admission, onward)           Start     Ordered   07/09/21 1931  Consult to hospitalist  Once       Provider:  (Not yet assigned)  Question Answer  Comment  Place call to: [facility]   Reason for Consult Admit      07/09/21 1930  OTHER Significant initial  Findings:  labs showing:    Recent Labs  Lab 07/09/21 1632  NA 133*  K 3.3*  CO2 27  GLUCOSE 134*  BUN 18  CREATININE 0.94  CALCIUM 10.3    Cr  stable,   Lab Results  Component Value Date   CREATININE 0.94 07/09/2021   CREATININE 1.03 05/25/2021   CREATININE 0.98 03/15/2021    Recent Labs  Lab 07/09/21 1632  AST 112*  ALT 105*  ALKPHOS 97  BILITOT 2.2*  PROT 8.9*  ALBUMIN 5.3*   Lab Results  Component Value Date   CALCIUM 10.3 07/09/2021     Plt: Lab Results  Component Value Date   PLT 152 07/09/2021      Recent Labs  Lab 07/09/21 1632  WBC 9.1  NEUTROABS 7.2  HGB 14.7  HCT 40.5  MCV 91.2  PLT 152    HG/HCT stable,      Component Value Date/Time   HGB 14.7 07/09/2021 1632   HCT 40.5 07/09/2021 1632   MCV 91.2 07/09/2021 1632   No results for input(s): LIPASE, AMYLASE in the last 168 hours. Recent Labs  Lab 07/09/21 2053  AMMONIA 18     Cardiac Panel (last 3 results) Recent Labs    07/09/21 2049  CKTOTAL 501*       Radiological Exams on Admission: No results found. _______________________________________________________________________________________________________ Latest  Blood pressure 138/88, pulse 73, temperature 98.1 F (36.7 C), temperature source Oral, resp. rate 17, SpO2 99 %.   Review of Systems:    Pertinent positives include:seizure, tremor  Constitutional:  No weight loss, night sweats, Fevers, chills, fatigue, weight loss  HEENT:  No headaches, Difficulty swallowing,Tooth/dental problems,Sore throat,  No sneezing, itching, ear ache, nasal congestion, post nasal drip,  Cardio-vascular:  No chest pain, Orthopnea, PND, anasarca, dizziness, palpitations.no Bilateral lower extremity swelling  GI:  No heartburn, indigestion, abdominal pain, nausea, vomiting, diarrhea, change in  bowel habits, loss of appetite, melena, blood in stool, hematemesis Resp:  no shortness of breath at rest. No dyspnea on exertion, No excess mucus, no productive cough, No non-productive cough, No coughing up of blood.No change in color of mucus.No wheezing. Skin:  no rash or lesions. No jaundice GU:  no dysuria, change in color of urine, no urgency or frequency. No straining to urinate.  No flank pain.  Musculoskeletal:  No joint pain or no joint swelling. No decreased range of motion. No back pain.  Psych:  No change in mood or affect. No depression or anxiety. No memory loss.  Neuro: no localizing neurological complaints, no tingling, no weakness, no double vision, no gait abnormality, no slurred speech, no confusion  All systems reviewed and apart from HOPI all are negative _______________________________________________________________________________________________ Past Medical History:   Past Medical History:  Diagnosis Date   Alcohol abuse    Seizure (HCC)       No past surgical history on file.  Social History:  Ambulatory  independently        reports that he has been smoking cigarettes. He has a 1.00 pack-year smoking history. He has quit using smokeless tobacco. He reports current alcohol use. He reports that he does not use drugs.   Family History:   Family History  Problem Relation Age of Onset   Diabetes Mother    Hypertension Mother    ______________________________________________________________________________________________ Allergies: No Known Allergies   Prior to Admission medications   Medication Sig Start Date End Date Taking? Authorizing Provider  ibuprofen (  ADVIL) 600 MG tablet Take 1 tablet (600 mg total) by mouth every 6 (six) hours as needed. 03/13/20  Yes Fayrene Helper, PA-C  levETIRAcetam (KEPPRA) 750 MG tablet Take 1 tablet (750 mg total) by mouth 2 (two) times daily. 07/09/21 10/07/21 Yes Ernie Avena, MD  thiamine 100 MG tablet Take 100  mg by mouth daily.   Yes [provider]  chlordiazePOXIDE (LIBRIUM) 25 MG capsule 50mg  PO TID x 1D, then 25-50mg  PO BID X 1D, then 25-50mg  PO QD X 1D Patient not taking: No sig reported 07/09/21   Ernie Avena, MD  lidocaine (LIDODERM) 5 % Place 1 patch onto the skin daily. Remove & Discard patch within 12 hours or as directed by MD Patient not taking: Reported on 07/09/2021 01/20/20   Marily Memos, MD    ___________________________________________________________________________________________________ Physical Exam: Vitals with BMI 07/09/2021 07/09/2021 05/25/2021  Height - - -  Weight - - -  BMI - - -  Systolic 138 147 384  Diastolic 88 83 92  Pulse 73 71 77     1. General:  in No  Acute distress   Chronically ill   -appearing 2. Psychological: Alert and  Oriented 3. Head/ENT:   Dry Mucous Membranes                          Head Non traumatic, neck supple                          Poor Dentition 4. SKIN:  decreased Skin turgor,  Skin clean Dry and intact no rash 5. Heart: Regular rate and rhythm no  Murmur, no Rub or gallop 6. Lungs:  no wheezes or crackles   7. Abdomen: Soft,  non-tender, Non distended  8. Lower extremities: no clubbing, cyanosis, no  edema 9. Neurologically Grossly intact, moving all 4 extremities equally   intact 10. MSK: Normal range of motion    Chart has been reviewed  ______________________________________________________________________________________________  Assessment/Plan 45 y.o. male with medical history significant of   seizures., alcohol abuse, tobacco abuse GAD  Admitted for alcohol withdrawal siezure   Present on Admission:  Alcohol withdrawal delirium (HCC) -thiamine folate CIWA protocol spoke about importance of quitting monitor in progressive care   Tobacco dependence -  - Spoke about importance of quitting spent 5 minutes discussing options for treatment, prior attempts at quitting, and dangers of smoking  -At this point  patient is    interested in quitting  - order nicotine patch   - nursing tobacco cessation protocol    Elevated liver enzymes chronic likely in the setting of ongoing alcohol abuse.  Denies any right upper quadrant tenderness at this time continue to monitor   Acute lower UTI -possible UTI, for tonight cover Rocephin await results of urine culture may further imaging if evidence of back pain  Seizure disorder resume Keppra For seizure most likely in the setting of noncompliance with home medications as well as alcohol withdrawal.  Continue to monitor switch Keppra to IV if unable to tolerate p.o. Other plan as per orders.  DVT prophylaxis:  SCD      Code Status:    Code Status: Prior FULL CODE  as per patient   I had personally discussed CODE STATUS with patient       Family Communication:   Family not at  Bedside    Disposition Plan:    To home once workup  is complete and patient is stable   Following barriers for discharge:                            Electrolytes corrected                                                     Will need to be able to tolerate PO                                                 Transition of care consulted                  Consults called: none    Admission status:  ED Disposition     ED Disposition  Admit   Condition  --   Comment  Hospital [facility] [[id number]]  Level of Care: Progressive [102]  Admit to Progressive based on following criteria: MULTISYSTEM THREATS such as stable sepsis, metabolic/electrolyte imbalance with or without encephalopathy that is responding to early treatment.  May place patient in observation at [facility] or [facility] if equivalent level of care is available:: No  Covid Evaluation: Asymptomatic Screening Protocol (No Symptoms)  Diagnosis: Alcohol withdrawal delirium (HCC) [291.0.ICD-9-CM]  Admitting Physician: Therisa Doyne [[id number]]  Attending Physician: Therisa Doyne  [[id number]]           Obs      Level of care    progressive  tele indefinitely please discontinue once patient no longer qualifies COVID-19 Labs    Lab Results  Component Value Date   SARSCOV2NAA NEGATIVE 07/09/2021     Precautions: admitted as  Covid Negative     PPE: Used by the provider:   N95  eye Goggles,  Gloves     Noralyn Karim 07/09/2021, 11:17 PM    [facility]     after 2 AM please page floor coverage PA If 7AM-7PM, please contact the day team taking care of the patient using [email or web address]   Patient was evaluated in the context of the global COVID-19 pandemic, which necessitated consideration that the patient might be at risk for infection with the SARS-CoV-2 virus that causes COVID-19. Institutional protocols and algorithms that pertain to the evaluation of patients at risk for COVID-19 are in a state of rapid change based on information released by regulatory bodies including the CDC and federal and state organizations. These policies and algorithms were followed during the patient's care.

## 2021-07-09 NOTE — ED Triage Notes (Signed)
Per EMS, states he had a witnessed seizure activity at friends house-friend said he was "shaking"-states patient was not post ictal when EMS arrived-patient initially did not want transport to ED-patient has been out of meds for 1 1/2 weeks, states he cant afford it-CBG 134

## 2021-07-09 NOTE — ED Provider Notes (Signed)
Emergency Medicine Provider Triage Evaluation Note  Dwayne Bolton , a 45 y.o. male  was evaluated in triage.  Pt complains of witnessed seizure.  He has a history of seizures.  He states that his last drink was 1 week ago and he feels very shaky.  Seizure was witnessed by friends his house he was out.  He has not been able to afford his Keppra and has not had any "a while."  He normally drinks a dix pack a day, hasn't had any in a week.  He feels very shakey.    Review of Systems  Positive: Seizure, tremor Negative: headache  Physical Exam  There were no vitals taken for this visit. Gen:   Awake, no distress   Resp:  Normal effort  MSK:   Moves extremities without difficulty  Other:  Mild tremor noted in bilateral hands  Medical Decision Making  Medically screening exam initiated at 2:27 PM.  Appropriate orders placed.  Dwayne Bolton was informed that the remainder of the evaluation will be completed by another provider, this initial triage assessment does not replace that evaluation, and the importance of remaining in the ED until their evaluation is complete.  CIWA orders placed.  Basic labs ordered.  Didn't fall per report.    Cristina Gong, PA-C 07/09/21 1431    Gloris Manchester, MD 07/09/21 2330

## 2021-07-10 ENCOUNTER — Other Ambulatory Visit: Payer: Self-pay

## 2021-07-10 LAB — CBC WITH DIFFERENTIAL/PLATELET
Abs Immature Granulocytes: 0.05 10*3/uL (ref 0.00–0.07)
Basophils Absolute: 0.1 10*3/uL (ref 0.0–0.1)
Basophils Relative: 1 %
Eosinophils Absolute: 0.1 10*3/uL (ref 0.0–0.5)
Eosinophils Relative: 1 %
HCT: 38.2 % — ABNORMAL LOW (ref 39.0–52.0)
Hemoglobin: 13.8 g/dL (ref 13.0–17.0)
Immature Granulocytes: 1 %
Lymphocytes Relative: 15 %
Lymphs Abs: 1.6 10*3/uL (ref 0.7–4.0)
MCH: 32.8 pg (ref 26.0–34.0)
MCHC: 36.1 g/dL — ABNORMAL HIGH (ref 30.0–36.0)
MCV: 90.7 fL (ref 80.0–100.0)
Monocytes Absolute: 1.9 10*3/uL — ABNORMAL HIGH (ref 0.1–1.0)
Monocytes Relative: 18 %
Neutro Abs: 6.7 10*3/uL (ref 1.7–7.7)
Neutrophils Relative %: 64 %
Platelets: 143 10*3/uL — ABNORMAL LOW (ref 150–400)
RBC: 4.21 MIL/uL — ABNORMAL LOW (ref 4.22–5.81)
RDW: 13.5 % (ref 11.5–15.5)
WBC: 10.3 10*3/uL (ref 4.0–10.5)
nRBC: 0.3 % — ABNORMAL HIGH (ref 0.0–0.2)

## 2021-07-10 LAB — COMPREHENSIVE METABOLIC PANEL
ALT: 89 U/L — ABNORMAL HIGH (ref 0–44)
AST: 99 U/L — ABNORMAL HIGH (ref 15–41)
Albumin: 4.3 g/dL (ref 3.5–5.0)
Alkaline Phosphatase: 100 U/L (ref 38–126)
Anion gap: 16 — ABNORMAL HIGH (ref 5–15)
BUN: 22 mg/dL — ABNORMAL HIGH (ref 6–20)
CO2: 33 mmol/L — ABNORMAL HIGH (ref 22–32)
Calcium: 10.4 mg/dL — ABNORMAL HIGH (ref 8.9–10.3)
Chloride: 84 mmol/L — ABNORMAL LOW (ref 98–111)
Creatinine, Ser: 0.94 mg/dL (ref 0.61–1.24)
GFR, Estimated: >60 mL/min (ref >60)
Glucose, Bld: 118 mg/dL — ABNORMAL HIGH (ref 70–99)
Potassium: 3.4 mmol/L — ABNORMAL LOW (ref 3.5–5.1)
Sodium: 133 mmol/L — ABNORMAL LOW (ref 135–145)
Total Bilirubin: 1.4 mg/dL — ABNORMAL HIGH (ref 0.3–1.2)
Total Protein: 7.7 g/dL (ref 6.5–8.1)

## 2021-07-10 LAB — PHOSPHORUS: Phosphorus: 3.2 mg/dL (ref 2.5–4.6)

## 2021-07-10 LAB — TSH: TSH: 2.405 u[IU]/mL (ref 0.350–4.500)

## 2021-07-10 LAB — MAGNESIUM: Magnesium: 2.4 mg/dL (ref 1.7–2.4)

## 2021-07-10 LAB — HIV ANTIBODY (ROUTINE TESTING W REFLEX): HIV Screen 4th Generation wRfx: NONREACTIVE

## 2021-07-10 MED ORDER — SODIUM CHLORIDE 0.9 % IV SOLN: INTRAVENOUS | Status: DC

## 2021-07-10 MED ORDER — POTASSIUM CHLORIDE CRYS ER 20 MEQ PO TBCR
40.0000 meq | EXTENDED_RELEASE_TABLET | Freq: Once | ORAL | Status: AC
  Administered 2021-07-10: 40 meq via ORAL
  Filled 2021-07-10: qty 2

## 2021-07-10 MED ORDER — LEVETIRACETAM 500 MG PO TABS
500.0000 mg | ORAL_TABLET | Freq: Two times a day (BID) | ORAL | Status: DC
  Administered 2021-07-10 – 2021-07-12 (×5): 500 mg via ORAL
  Filled 2021-07-10 (×5): qty 1

## 2021-07-10 NOTE — TOC Initial Note (Addendum)
Transition of Care [facility]) - Initial/Assessment Note    Patient Details  Name: Dwayne Bolton MRN: [id number] Date of Birth: 1976-03-07  [facility] CM/SW Contact:    Lanier Clam, RN Phone Number: 07/10/2021, 1:03 PM  Clinical Narrative:  Spoke to patient about d/c plans-home,no pcp-has gone to patient care center in past will go there again(make own pcp appt) will provide substance abuse resources,& health insurance info.can asst w/meds if appropriate for MATCH program-no narcotics,OTC,controlled meds.Will provide bus pass on shadow chart.               Expected Discharge Plan: Home/Self Care Barriers to Discharge: Continued Medical Work up   Patient Goals and CMS Choice Patient states their goals for this hospitalization and ongoing recovery are:: go home CMS Medicare.gov Compare Post Acute Care list provided to:: Patient Choice offered to / list presented to : Patient  Expected Discharge Plan and Services Expected Discharge Plan: Home/Self Care   Discharge Planning Services: CM Consult                                          Prior Living Arrangements/Services   Lives with:: Siblings Patient language and need for interpreter reviewed:: Yes Do you feel safe going back to the place where you live?: Yes      Need for Family Participation in Patient Care: No (Comment) Care giver support system in place?: Yes (comment)   Criminal Activity/Legal Involvement Pertinent to Current Situation/Hospitalization: No - Comment as needed  Activities of Daily Living Home Assistive Devices/Equipment: Cane (specify quad or straight) ADL Screening (condition at time of admission) Patient's cognitive ability adequate to safely complete daily activities?: Yes Is the patient deaf or have difficulty hearing?: No Does the patient have difficulty seeing, even when wearing glasses/contacts?: No Does the patient have difficulty concentrating, remembering, or making  decisions?: No Patient able to express need for assistance with ADLs?: No Does the patient have difficulty dressing or bathing?: No Independently performs ADLs?: Yes (appropriate for developmental age) Does the patient have difficulty walking or climbing stairs?: Yes (after a fall) Weakness of Legs: None Weakness of Arms/Hands: None  Permission Sought/Granted Permission sought to share information with : Case Manager Permission granted to share information with : Yes, Verbal Permission Granted  Share Information with NAME: Case Manager           Emotional Assessment Appearance:: Appears stated age Attitude/Demeanor/Rapport: Gracious Affect (typically observed): Accepting Orientation: : Oriented to Self, Oriented to Place, Oriented to  Time, Oriented to Situation Alcohol / Substance Use: Tobacco Use, Alcohol Use Psych Involvement: No (comment)  Admission diagnosis:  Alcohol withdrawal delirium (HCC) [F10.231] Seizure (HCC) [R56.9] Alcohol withdrawal seizure (HCC) [S34.196, R56.9] Alcohol withdrawal syndrome without complication (HCC) [F10.230] Patient Active Problem List   Diagnosis Date Noted   Alcohol withdrawal delirium (HCC) 07/09/2021   Acute lower UTI 07/09/2021   Tobacco dependence 12/31/2015   Seizure disorder (HCC) 12/31/2015   Convulsion (HCC)    Alcohol withdrawal seizure (HCC) 03/22/2015   GAD (generalized anxiety disorder) 01/13/2014   Adjustment disorder with mixed anxiety and depressed mood 01/13/2014   Seizures (HCC) 01/12/2014   Elevated liver enzymes 01/12/2014   Alcohol dependence (HCC) 01/11/2014   PCP:  Pcp, No Pharmacy:   [facility] [address] [place] [address] Phone: [phone] Fax: [phone]  [facility] [id number] [place], [place] - [address] [facility] [address] [facility] [address] [place] [address] Phone: [phone] Fax: [phone]  [id number] [place], [place] -  [address] AT [facility] [address] [facility] [address] [place] [address] Phone: [phone] Fax: [phone]     Social Determinants of Health (SDOH) Interventions    Readmission Risk Interventions No flowsheet data found.

## 2021-07-10 NOTE — Progress Notes (Signed)
[facility]                                                                              Patient Demographics  Dwayne Bolton, is a 45 y.o. male, DOB - 1975-12-06, AJG:[id number]  Admit date - 07/09/2021   Admitting Physician Therisa Doyne, MD  Outpatient Primary MD for the patient is Pcp, No  Outpatient specialists:   LOS - 0  days   Medical records reviewed and are as summarized below:    Chief Complaint  Patient presents with   Seizures       Brief summary   Patient is a 45 year old male with history of seizures, noncompliant with Keppra, due to financial reasons, alcohol abuse, tobacco use, anxiety disorder presented with weakness seizure.  Patient reported that his last drink was 1 week ago and he felt very shaky.  No tongue biting or urinary incontinence.  Reports that he has not been able to afford Keppra and has not had it in a while.  He works as a Estate agent.  Assessment & Plan    Principal Problem:   Seizures (HCC) -Per patient, he has not been able to afford Keppra from [facility] due to financial reasons.  Discussed with pharmacy, [facility] has cheaper option for Keppra, 500mg  60 tablets for $9 -Will place on Keppra 500 mg p.o. twice daily   Active Problems: Alcohol withdrawals in the setting of alcohol abuse, alcohol withdrawal delirium -Patient counseled strongly on quitting alcohol. -Continue CIWA protocol with Ativan, thiamine, folate  Tobacco use -Continue nicotine patch, counseled on smoking cessation  Transaminitis -Likely due to alcohol abuse, improving  Hyponatremia, hypokalemia likely due to dehydration -UA showed ketones - will replace potassium, continue gentle hydration with normal saline -Magnesium and phosphorus stable   Probable acute lower UTI -UA showed rare bacteria, positive nitrite, > 50 WBCs -Continue IV Rocephin, follow urine culture and sensitivities   Code Status: Full CODE  STATUS DVT Prophylaxis:  SCDs Start: 07/09/21 2244   Level of Care: Level of care: Progressive Family Communication: Discussed all imaging results, lab results, explained to the patient   Disposition Plan:     Status is: Observation  The patient remains OBS appropriate and will d/c before 2 midnights.  Dispo: The patient is from: Home              Anticipated d/c is to: Home              Patient currently is not medically stable to d/c.  Still in acute alcohol withdrawals, tremulous, pending urine culture and sensitivities   Difficult to place patient No      Time Spent in minutes   35 minutes  Procedures:  None  Consultants:   None  Antimicrobials:   Anti-infectives (From admission, onward)    Start     Dose/Rate Route Frequency Ordered Stop   07/10/21 0000  cefTRIAXone (ROCEPHIN) 1 g in sodium chloride 0.9 % 100 mL IVPB        1 g 200 mL/hr over 30 Minutes Intravenous Every 24 hours 07/09/21 2310  Medications  Scheduled Meds:  levETIRAcetam  500 mg Oral BID   LORazepam  0-4 mg Intravenous Q6H   Or   LORazepam  0-4 mg Oral Q6H   [START ON 07/11/2021] LORazepam  0-4 mg Intravenous Q12H   Or   [START ON 07/11/2021] LORazepam  0-4 mg Oral Q12H   nicotine  21 mg Transdermal Daily   thiamine  100 mg Oral Daily   Or   thiamine  100 mg Intravenous Daily   Continuous Infusions:  cefTRIAXone (ROCEPHIN)  IV Stopped (07/10/21 0050)   lactated ringers 125 mL/hr at 07/10/21 0832   PRN Meds:.benzocaine      Subjective:   Dwayne Bolton was seen and examined today.  Much more alert and awake however tremulous and shaky. Patient denies dizziness, chest pain, shortness of breath, abdominal pain, N/V/D/C.  No fevers.  No repeat seizures. Objective:   Vitals:   07/10/21 0249 07/10/21 0629 07/10/21 0800 07/10/21 1200  BP: 124/86 122/78 127/86 (!) 109/99  Pulse: 84 86 85 75  Resp: 20 20 20 20   Temp: 98.2 F (36.8 C) 98.3 F (36.8 C) 98.6 F (37 C)  98.9 F (37.2 C)  TempSrc: Oral  Oral Oral  SpO2: 100% 99% 99% 99%    Intake/Output Summary (Last 24 hours) at 07/10/2021 1409 Last data filed at 07/10/2021 0400 Gross per 24 hour  Intake 522.7 ml  Output --  Net 522.7 ml     Wt Readings from Last 3 Encounters:  03/15/21 88.5 kg  02/11/21 79.4 kg  11/05/20 79.4 kg     Exam General: Alert and oriented x 3, NAD Cardiovascular: S1 S2 auscultated, no murmurs, RRR Respiratory: Clear to auscultation bilaterally, no wheezing, rales or rhonchi Gastrointestinal: Soft, nontender, nondistended, + bowel sounds Ext: no pedal edema bilaterally, shaky and tremulous Neuro: moving all 4 extremities Psych: anxious alert and oriented x3    Data Reviewed:  I have personally reviewed following labs and imaging studies  Micro Results Recent Results (from the past 240 hour(s))  Resp Panel by RT-PCR (Flu A&B, Covid) Nasopharyngeal Swab     Status: None   Collection Time: 07/09/21  8:53 PM   Specimen: Nasopharyngeal Swab; Nasopharyngeal(NP) swabs in vial transport medium  Result Value Ref Range Status   SARS Coronavirus 2 by RT PCR NEGATIVE NEGATIVE Final    Comment: (NOTE) SARS-CoV-2 target nucleic acids are NOT DETECTED.  The SARS-CoV-2 RNA is generally detectable in upper respiratory specimens during the acute phase of infection. The lowest concentration of SARS-CoV-2 viral copies this assay can detect is 138 copies/mL. A negative result does not preclude SARS-Cov-2 infection and should not be used as the sole basis for treatment or other patient management decisions. A negative result may occur with  improper specimen collection/handling, submission of specimen other than nasopharyngeal swab, presence of viral mutation(s) within the areas targeted by this assay, and inadequate number of viral copies(<138 copies/mL). A negative result must be combined with clinical observations, patient history, and epidemiological information. The  expected result is Negative.  Fact Sheet for Patients:  07/11/21  Fact Sheet for Healthcare Providers:  BloggerCourse.com  This test is no t yet approved or cleared by the [employer] FDA and  has been authorized for detection and/or diagnosis of SARS-CoV-2 by FDA under an Emergency Use Authorization (EUA). This EUA will remain  in effect (meaning this test can be used) for the duration of the COVID-19 declaration under Section 564(b)(1) of the Act, 21 U.S.C.section 360bbb-3(b)(1),  unless the authorization is terminated  or revoked sooner.       Influenza A by PCR NEGATIVE NEGATIVE Final   Influenza B by PCR NEGATIVE NEGATIVE Final    Comment: (NOTE) The Xpert Xpress SARS-CoV-2/FLU/RSV plus assay is intended as an aid in the diagnosis of influenza from Nasopharyngeal swab specimens and should not be used as a sole basis for treatment. Nasal washings and aspirates are unacceptable for Xpert Xpress SARS-CoV-2/FLU/RSV testing.  Fact Sheet for Patients: BloggerCourse.com  Fact Sheet for Healthcare Providers: SeriousBroker.it  This test is not yet approved or cleared by the [place] FDA and has been authorized for detection and/or diagnosis of SARS-CoV-2 by FDA under an Emergency Use Authorization (EUA). This EUA will remain in effect (meaning this test can be used) for the duration of the COVID-19 declaration under Section 564(b)(1) of the Act, 21 U.S.C. section 360bbb-3(b)(1), unless the authorization is terminated or revoked.  Performed at [facility], [address], [place], [place] [address]     Radiology Reports No results found.  Lab Data:  CBC: Recent Labs  Lab 07/09/21 1632 07/10/21 0423  WBC 9.1 10.3  NEUTROABS 7.2 6.7  HGB 14.7 13.8  HCT 40.5 38.2*  MCV 91.2 90.7  PLT 152 143*   Basic Metabolic Panel: Recent Labs   Lab 07/09/21 1632 07/09/21 2049 07/10/21 0423  NA 133*  --  133*  K 3.3*  --  3.4*  CL 81*  --  84*  CO2 27  --  33*  GLUCOSE 134*  --  118*  BUN 18  --  22*  CREATININE 0.94  --  0.94  CALCIUM 10.3  --  10.4*  MG 2.7*  --  2.4  PHOS  --  4.5 3.2   GFR: CrCl cannot be calculated (Unknown ideal weight.). Liver Function Tests: Recent Labs  Lab 07/09/21 1632 07/10/21 0423  AST 112* 99*  ALT 105* 89*  ALKPHOS 97 100  BILITOT 2.2* 1.4*  PROT 8.9* 7.7  ALBUMIN 5.3* 4.3   No results for input(s): LIPASE, AMYLASE in the last 168 hours. Recent Labs  Lab 07/09/21 2053  AMMONIA 18   Coagulation Profile: Recent Labs  Lab 07/09/21 2053  INR 1.0   Cardiac Enzymes: Recent Labs  Lab 07/09/21 2049  CKTOTAL 501*   BNP (last 3 results) No results for input(s): PROBNP in the last 8760 hours. HbA1C: No results for input(s): HGBA1C in the last 72 hours. CBG: No results for input(s): GLUCAP in the last 168 hours. Lipid Profile: No results for input(s): CHOL, HDL, LDLCALC, TRIG, CHOLHDL, LDLDIRECT in the last 72 hours. Thyroid Function Tests: Recent Labs    07/10/21 0423  TSH 2.405   Anemia Panel: No results for input(s): VITAMINB12, FOLATE, FERRITIN, TIBC, IRON, RETICCTPCT in the last 72 hours. Urine analysis:    Component Value Date/Time   COLORURINE AMBER (A) 07/09/2021 2030   APPEARANCEUR HAZY (A) 07/09/2021 2030   LABSPEC >1.030 (H) 07/09/2021 2030   PHURINE 6.0 07/09/2021 2030   GLUCOSEU NEGATIVE 07/09/2021 2030   HGBUR LARGE (A) 07/09/2021 2030   BILIRUBINUR MODERATE (A) 07/09/2021 2030   KETONESUR >80 (A) 07/09/2021 2030   PROTEINUR >300 (A) 07/09/2021 2030   UROBILINOGEN 0.2 12/28/2015 1159   NITRITE POSITIVE (A) 07/09/2021 2030   LEUKOCYTESUR NEGATIVE 07/09/2021 2030     Jamison Soward M.D. [facility] 07/10/2021, 2:09 PM  Available via Epic secure chat 7am-7pm After 7 pm, please refer to night coverage  provider listed on amion.

## 2021-07-11 DIAGNOSIS — R569 Unspecified convulsions: Secondary | ICD-10-CM

## 2021-07-11 LAB — COMPREHENSIVE METABOLIC PANEL
ALT: 65 U/L — ABNORMAL HIGH (ref 0–44)
AST: 61 U/L — ABNORMAL HIGH (ref 15–41)
Albumin: 3.6 g/dL (ref 3.5–5.0)
Alkaline Phosphatase: 125 U/L (ref 38–126)
Anion gap: 7 (ref 5–15)
BUN: 19 mg/dL (ref 6–20)
CO2: 31 mmol/L (ref 22–32)
Calcium: 9.7 mg/dL (ref 8.9–10.3)
Chloride: 99 mmol/L (ref 98–111)
Creatinine, Ser: 0.83 mg/dL (ref 0.61–1.24)
GFR, Estimated: >60 mL/min (ref >60)
Glucose, Bld: 115 mg/dL — ABNORMAL HIGH (ref 70–99)
Potassium: 3.3 mmol/L — ABNORMAL LOW (ref 3.5–5.1)
Sodium: 137 mmol/L (ref 135–145)
Total Bilirubin: 0.6 mg/dL (ref 0.3–1.2)
Total Protein: 6.3 g/dL — ABNORMAL LOW (ref 6.5–8.1)

## 2021-07-11 LAB — URINE CULTURE

## 2021-07-11 MED ORDER — POTASSIUM CHLORIDE CRYS ER 20 MEQ PO TBCR
40.0000 meq | EXTENDED_RELEASE_TABLET | Freq: Once | ORAL | Status: AC
  Administered 2021-07-11: 40 meq via ORAL
  Filled 2021-07-11: qty 2

## 2021-07-11 NOTE — Progress Notes (Signed)
.rdr             [facility]                                                                              Patient Demographics  Dwayne Bolton, is a 45 y.o. male, DOB - 09-May-1976, SVX:[id number]  Admit date - 07/09/2021   Admitting Physician Therisa Doyne, MD  Outpatient Primary MD for the patient is Pcp, No  Outpatient specialists:   LOS - 0  days   Medical records reviewed and are as summarized below:    Chief Complaint  Patient presents with   Seizures       Brief summary   Patient is a 45 year old male with history of seizures, noncompliant with Keppra, due to financial reasons, alcohol abuse, tobacco use, anxiety disorder presented with weakness seizure.  Patient reported that his last drink was 1 week ago and he felt very shaky.  No tongue biting or urinary incontinence.  Reports that he has not been able to afford Keppra and has not had it in a while.  He works as a Estate agent.  Assessment & Plan    Principal Problem:   Seizures (HCC) -Per patient, he has not been able to afford Keppra from [facility] due to financial reasons.  Discussed with pharmacy, [facility] has cheaper option for Keppra, 500mg  60 tablets for $9 -Continue Keppra 500 mg twice daily  Active Problems: Alcohol withdrawals in the setting of alcohol abuse, alcohol withdrawal delirium -Patient counseled strongly on quitting alcohol. -Continue CIWA with Ativan protocol, overnight CIWA 6 -Continue thiamine, folate  Tobacco use -Continue nicotine patch,  Transaminitis -Transaminitis improving, counseled on alcohol cessation  Hyponatremia, hypokalemia likely due to dehydration -UA had shown ketones, continue IV fluid hydration -Potassium 3.3, replaced  Probable acute lower UTI -UA showed rare bacteria, positive nitrite, > 50 WBCs -UA showed multiple species, will DC IV Rocephin, asymptomatic, no fevers, no dysuria   Code Status: Full CODE STATUS DVT Prophylaxis:   SCDs Start: 07/09/21 2244   Level of Care: Level of care: Progressive Family Communication: Discussed all imaging results, lab results, explained to the patient   Disposition Plan:     Status is: Observation  The patient will require care spanning > 2 midnights and should be moved to inpatient because: Inpatient level of care appropriate due to severity of illness  Dispo: The patient is from: Home              Anticipated d/c is to: Home              Patient currently is not medically stable to d/c.  Still in acute alcohol withdrawals, tremulous, hopefully DC home in a.m. if improving   Difficult to place patient No      Time Spent in minutes   25 minutes Procedures:  None  Consultants:   None  Antimicrobials:   Anti-infectives (From admission, onward)    Start     Dose/Rate Route Frequency Ordered Stop   07/10/21 0000  cefTRIAXone (ROCEPHIN) 1 g in sodium chloride 0.9 % 100 mL IVPB        1 g 200 mL/hr over 30  Minutes Intravenous Every 24 hours 07/09/21 2310            Medications  Scheduled Meds:  levETIRAcetam  500 mg Oral BID   LORazepam  0-4 mg Intravenous Q6H   Or   LORazepam  0-4 mg Oral Q6H   LORazepam  0-4 mg Intravenous Q12H   Or   LORazepam  0-4 mg Oral Q12H   nicotine  21 mg Transdermal Daily   thiamine  100 mg Oral Daily   Or   thiamine  100 mg Intravenous Daily   Continuous Infusions:  sodium chloride 125 mL/hr at 07/10/21 1641   cefTRIAXone (ROCEPHIN)  IV 1 g (07/10/21 2344)   PRN Meds:.benzocaine      Subjective:   Marlyn Rabine was seen and examined today.  No acute complaints, still somewhat tremulous.  Sleepy, no seizures overnight.  No fevers.    Objective:   Vitals:   07/11/21 0515 07/11/21 0800 07/11/21 0955 07/11/21 1234  BP: (!) 149/89   135/89  Pulse: 60  99 93  Resp:  (!) 23 18 20   Temp: 98.5 F (36.9 C)   98.7 F (37.1 C)  TempSrc: Oral   Oral  SpO2: 100%  100% 100%    Intake/Output Summary (Last 24 hours)  at 07/11/2021 1311 Last data filed at 07/11/2021 1000 Gross per 24 hour  Intake 2680 ml  Output 1325 ml  Net 1355 ml     Wt Readings from Last 3 Encounters:  03/15/21 88.5 kg  02/11/21 79.4 kg  11/05/20 79.4 kg   Physical Exam General: Sleepy but easily arousable and oriented, NAD Cardiovascular: S1 S2 clear, RRR. No pedal edema b/l Respiratory: CTAB, no wheezing, rales or rhonchi Gastrointestinal: Soft, nontender, nondistended, NBS Ext: no pedal edema bilaterally, still somewhat tremulous but improving Neuro: no new deficits    Data Reviewed:  I have personally reviewed following labs and imaging studies  Micro Results Recent Results (from the past 240 hour(s))  Resp Panel by RT-PCR (Flu A&B, Covid) Nasopharyngeal Swab     Status: None   Collection Time: 07/09/21  8:53 PM   Specimen: Nasopharyngeal Swab; Nasopharyngeal(NP) swabs in vial transport medium  Result Value Ref Range Status   SARS Coronavirus 2 by RT PCR NEGATIVE NEGATIVE Final    Comment: (NOTE) SARS-CoV-2 target nucleic acids are NOT DETECTED.  The SARS-CoV-2 RNA is generally detectable in upper respiratory specimens during the acute phase of infection. The lowest concentration of SARS-CoV-2 viral copies this assay can detect is 138 copies/mL. A negative result does not preclude SARS-Cov-2 infection and should not be used as the sole basis for treatment or other patient management decisions. A negative result may occur with  improper specimen collection/handling, submission of specimen other than nasopharyngeal swab, presence of viral mutation(s) within the areas targeted by this assay, and inadequate number of viral copies(<138 copies/mL). A negative result must be combined with clinical observations, patient history, and epidemiological information. The expected result is Negative.  Fact Sheet for Patients:  07/11/21  Fact Sheet for Healthcare Providers:   BloggerCourse.com  This test is no t yet approved or cleared by the [employer] FDA and  has been authorized for detection and/or diagnosis of SARS-CoV-2 by FDA under an Emergency Use Authorization (EUA). This EUA will remain  in effect (meaning this test can be used) for the duration of the COVID-19 declaration under Section 564(b)(1) of the Act, 21 U.S.C.section 360bbb-3(b)(1), unless the authorization is terminated  or revoked sooner.  Influenza A by PCR NEGATIVE NEGATIVE Final   Influenza B by PCR NEGATIVE NEGATIVE Final    Comment: (NOTE) The Xpert Xpress SARS-CoV-2/FLU/RSV plus assay is intended as an aid in the diagnosis of influenza from Nasopharyngeal swab specimens and should not be used as a sole basis for treatment. Nasal washings and aspirates are unacceptable for Xpert Xpress SARS-CoV-2/FLU/RSV testing.  Fact Sheet for Patients: BloggerCourse.com  Fact Sheet for Healthcare Providers: SeriousBroker.it  This test is not yet approved or cleared by the [place] FDA and has been authorized for detection and/or diagnosis of SARS-CoV-2 by FDA under an Emergency Use Authorization (EUA). This EUA will remain in effect (meaning this test can be used) for the duration of the COVID-19 declaration under Section 564(b)(1) of the Act, 21 U.S.C. section 360bbb-3(b)(1), unless the authorization is terminated or revoked.  Performed at [facility], [address] [place], [place] [address]   Urine Culture     Status: Abnormal   Collection Time: 07/09/21 11:03 PM   Specimen: Urine, Clean Catch  Result Value Ref Range Status   Specimen Description   Final    URINE, CLEAN CATCH Performed at [facility], [address] [place], [place] [address]    Special Requests   Final    NONE Performed at [facility], [address]  [place], [place] [address]    Culture MULTIPLE SPECIES PRESENT, SUGGEST RECOLLECTION (A)  Final   Report Status 07/11/2021 FINAL  Final    Radiology Reports No results found.  Lab Data:  CBC: Recent Labs  Lab 07/09/21 1632 07/10/21 0423  WBC 9.1 10.3  NEUTROABS 7.2 6.7  HGB 14.7 13.8  HCT 40.5 38.2*  MCV 91.2 90.7  PLT 152 143*   Basic Metabolic Panel: Recent Labs  Lab 07/09/21 1632 07/09/21 2049 07/10/21 0423 07/11/21 0441  NA 133*  --  133* 137  K 3.3*  --  3.4* 3.3*  CL 81*  --  84* 99  CO2 27  --  33* 31  GLUCOSE 134*  --  118* 115*  BUN 18  --  22* 19  CREATININE 0.94  --  0.94 0.83  CALCIUM 10.3  --  10.4* 9.7  MG 2.7*  --  2.4  --   PHOS  --  4.5 3.2  --    GFR: CrCl cannot be calculated (Unknown ideal weight.). Liver Function Tests: Recent Labs  Lab 07/09/21 1632 07/10/21 0423 07/11/21 0441  AST 112* 99* 61*  ALT 105* 89* 65*  ALKPHOS 97 100 125  BILITOT 2.2* 1.4* 0.6  PROT 8.9* 7.7 6.3*  ALBUMIN 5.3* 4.3 3.6   No results for input(s): LIPASE, AMYLASE in the last 168 hours. Recent Labs  Lab 07/09/21 2053  AMMONIA 18   Coagulation Profile: Recent Labs  Lab 07/09/21 2053  INR 1.0   Cardiac Enzymes: Recent Labs  Lab 07/09/21 2049  CKTOTAL 501*   BNP (last 3 results) No results for input(s): PROBNP in the last 8760 hours. HbA1C: No results for input(s): HGBA1C in the last 72 hours. CBG: No results for input(s): GLUCAP in the last 168 hours. Lipid Profile: No results for input(s): CHOL, HDL, LDLCALC, TRIG, CHOLHDL, LDLDIRECT in the last 72 hours. Thyroid Function Tests: Recent Labs    07/10/21 0423  TSH 2.405   Anemia Panel: No results for input(s): VITAMINB12, FOLATE, FERRITIN, TIBC, IRON, RETICCTPCT in the last 72 hours. Urine analysis:    Component Value  Date/Time   COLORURINE AMBER (A) 07/09/2021 2030   APPEARANCEUR HAZY (A) 07/09/2021 2030   LABSPEC >1.030 (H) 07/09/2021 2030   PHURINE 6.0 07/09/2021 2030   GLUCOSEU  NEGATIVE 07/09/2021 2030   HGBUR LARGE (A) 07/09/2021 2030   BILIRUBINUR MODERATE (A) 07/09/2021 2030   KETONESUR >80 (A) 07/09/2021 2030   PROTEINUR >300 (A) 07/09/2021 2030   UROBILINOGEN 0.2 12/28/2015 1159   NITRITE POSITIVE (A) 07/09/2021 2030   LEUKOCYTESUR NEGATIVE 07/09/2021 2030     Yuan Gann M.D. [facility] 07/11/2021, 1:11 PM  Available via Epic secure chat 7am-7pm After 7 pm, please refer to night coverage provider listed on amion.

## 2021-07-12 MED ORDER — IBUPROFEN 200 MG PO TABS
400.0000 mg | ORAL_TABLET | Freq: Once | ORAL | Status: AC
  Administered 2021-07-12: 400 mg via ORAL
  Filled 2021-07-12: qty 2

## 2021-07-12 MED ORDER — THIAMINE HCL 100 MG PO TABS
100.0000 mg | ORAL_TABLET | Freq: Every day | ORAL | 1 refills | Status: DC
Start: 2021-07-12 — End: 2021-12-25

## 2021-07-12 MED ORDER — LEVETIRACETAM 500 MG PO TABS: 500.0000 mg | ORAL_TABLET | Freq: Two times a day (BID) | ORAL | 3 refills | Status: DC

## 2021-07-12 MED ORDER — LORAZEPAM 1 MG PO TABS: 1.0000 mg | ORAL_TABLET | Freq: Every day | ORAL | 0 refills | Status: DC | PRN

## 2021-07-12 NOTE — Discharge Summary (Signed)
Physician Discharge Summary   Patient ID: Dwayne Bolton MRN: [id number] DOB/AGE: 06/10/1976 45 y.o.  Admit date: 07/09/2021 Discharge date: 07/12/2021  Primary Care Physician:  Pcp, No   Recommendations for Outpatient Follow-up:  Follow up with PCP in 1-2 weeks  Home Health: None  Equipment/Devices:   Discharge Condition: stable  CODE STATUS: FULL  Diet recommendation: Regular diet   Discharge Diagnoses:     Alcohol withdrawal delirium (HCC)  Seizure   Hyponatremia   Hypokalemia   Dehydration  Tobacco dependence  Elevated liver enzymes  Alcohol abuse   Consults: None    Allergies:  No Known Allergies   DISCHARGE MEDICATIONS: Allergies as of 07/12/2021   No Known Allergies      Medication List     STOP taking these medications    chlordiazePOXIDE 25 MG capsule Commonly known as: LIBRIUM   lidocaine 5 % Commonly known as: Lidoderm   methocarbamol 500 MG tablet Commonly known as: ROBAXIN       TAKE these medications    ibuprofen 600 MG tablet Commonly known as: ADVIL Take 1 tablet (600 mg total) by mouth every 6 (six) hours as needed.   levETIRAcetam 500 MG tablet Commonly known as: KEPPRA Take 1 tablet (500 mg total) by mouth 2 (two) times daily. What changed:  medication strength how much to take Another medication with the same name was removed. Continue taking this medication, and follow the directions you see here.   LORazepam 1 MG tablet Commonly known as: Ativan Take 1 tablet (1 mg total) by mouth daily as needed for anxiety or seizure.   thiamine 100 MG tablet Take 1 tablet (100 mg total) by mouth daily.         Brief H and P: For complete details please refer to admission H and P, but in brief Patient is a 45 year old male with history of seizures, noncompliant with Keppra, due to financial reasons, alcohol abuse, tobacco use, anxiety disorder presented with weakness seizure.  Patient reported that his last drink was  1 week ago and he felt very shaky.  No tongue biting or urinary incontinence.  Reports that he has not been able to afford Keppra and has not had it in a while.  He works as a Estate agent.  Hospital Course:  Seizures [facility]) -Per patient, he has not been able to afford Keppra from [facility] due to financial reasons.  Discussed with pharmacy, [facility] has cheaper option for Keppra, 500mg  60 tablets for $9 -Continue Keppra 500 mg twice daily. -Counseled strongly on alcohol cessation - patient will need to follow-up with neurology and PCP outpatient for switching him from Keppra to another cheaper antiepileptic medication.  Alcohol withdrawals in the setting of alcohol abuse, alcohol withdrawal delirium -Patient counseled strongly on quitting alcohol. -Patient was placed on CIWA protocol with Ativan -Continue thiamine, folate -Currently stable, ambulating without any difficulty.  No acute delirium or patrols.  Tremulousness improving.  Wants to go home.  States Librium has not worked much in the past and Ativan has helped with the seizures, anxiety and withdrawals.  Gave prescription for Ativan #15tabs   Tobacco use -Counseled on tobacco cessation   Transaminitis -Transaminitis improving, counseled on alcohol cessation   Hyponatremia, hypokalemia likely due to dehydration -UA had shown ketones, patient was placed on IV fluid hydration -Potassium 3.3, replaced    Day of Discharge S: No acute complaints, wants to go home.  Overall improving no acute issues overnight  BP 129/80 (  BP Location: Right Arm)   Pulse 79   Temp 98.1 F (36.7 C) (Oral)   Resp 20   SpO2 100%   Physical Exam: General: Alert and awake oriented x3 not in any acute distress. CVS: S1-S2 clear no murmur rubs or gallops Chest: clear to auscultation bilaterally, no wheezing rales or rhonchi Abdomen: soft nontender, nondistended, normal bowel sounds Extremities: no cyanosis, clubbing or edema noted  bilaterally     Get Medicines reviewed and adjusted: Please take all your medications with you for your next visit with your Primary MD  Please request your Primary MD to go over all hospital tests and procedure/radiological results at the follow up. Please ask your Primary MD to get all Hospital records sent to his/her office.  If you experience worsening of your admission symptoms, develop shortness of breath, life threatening emergency, suicidal or homicidal thoughts you must seek medical attention immediately by calling 911 or calling your MD immediately  if symptoms less severe.  You must read complete instructions/literature along with all the possible adverse reactions/side effects for all the Medicines you take and that have been prescribed to you. Take any new Medicines after you have completely understood and accept all the possible adverse reactions/side effects.   Do not drive when taking pain medications.   Do not take more than prescribed Pain, Sleep and Anxiety Medications  Special Instructions: If you have smoked or chewed Tobacco  in the last 2 yrs please stop smoking, stop any regular Alcohol  and or any Recreational drug use.  Wear Seat belts while driving.  Please note  You were cared for by a hospitalist during your hospital stay. Once you are discharged, your primary care physician will handle any further medical issues. Please note that NO REFILLS for any discharge medications will be authorized once you are discharged, as it is imperative that you return to your primary care physician (or establish a relationship with a primary care physician if you do not have one) for your aftercare needs so that they can reassess your need for medications and monitor your lab values.   The results of significant diagnostics from this hospitalization (including imaging, microbiology, ancillary and laboratory) are listed below for reference.      Procedures/Studies:  No  results found.    LAB RESULTS: Basic Metabolic Panel: Recent Labs  Lab 07/10/21 0423 07/11/21 0441  NA 133* 137  K 3.4* 3.3*  CL 84* 99  CO2 33* 31  GLUCOSE 118* 115*  BUN 22* 19  CREATININE 0.94 0.83  CALCIUM 10.4* 9.7  MG 2.4  --   PHOS 3.2  --    Liver Function Tests: Recent Labs  Lab 07/10/21 0423 07/11/21 0441  AST 99* 61*  ALT 89* 65*  ALKPHOS 100 125  BILITOT 1.4* 0.6  PROT 7.7 6.3*  ALBUMIN 4.3 3.6   No results for input(s): LIPASE, AMYLASE in the last 168 hours. Recent Labs  Lab 07/09/21 2053  AMMONIA 18   CBC: Recent Labs  Lab 07/09/21 1632 07/10/21 0423  WBC 9.1 10.3  NEUTROABS 7.2 6.7  HGB 14.7 13.8  HCT 40.5 38.2*  MCV 91.2 90.7  PLT 152 143*   Cardiac Enzymes: Recent Labs  Lab 07/09/21 2049  CKTOTAL 501*   BNP: Invalid input(s): POCBNP CBG: No results for input(s): GLUCAP in the last 168 hours.     Disposition and Follow-up: Discharge Instructions     Diet general   Complete by: As directed  Discharge instructions   Complete by: As directed    Discharge instructions:  Please follow these seizure precautions Per [facility] statutes, patients with seizures are not allowed to drive until  they have been seizure-free for six months. Use caution when using heavy equipment or power tools. Avoid working on ladders or at heights. Take showers instead of baths. Ensure the water temperature is not too high on the home water heater. Do not go swimming alone. When caring for infants or small children, sit down when holding, feeding, or changing them to minimize risk of injury to the child in the event you have a seizure.    Also, Maintain good sleep hygiene. Avoid alcohol.   --> Call 911 and bring the patient back to the ED if:               A.  The seizure lasts longer than 5 minutes.                  B.  The patient doesn't awaken shortly after the seizure             C.  The patient has new problems such as difficulty  seeing, speaking or moving             D.  The patient was injured during the seizure             E.  The patient has a temperature over 102 F (39C)             F.  The patient vomited and now is having trouble breathing   Increase activity slowly   Complete by: As directed         DISPOSITION: Home   DISCHARGE FOLLOW-UP  Follow-up Information     [facility]. Go on 08/14/2021.   Specialty: Family Medicine Why: @ 1:30p-Michelle Gypsy [place] information: [address] [place] [phone]                 Time coordinating discharge:  35 minutes  Signed:   Thad Ranger M.D. [facility] 07/12/2021, 1:07 PM

## 2021-07-12 NOTE — Progress Notes (Signed)
Patient discharged per order.  IV removed, tele monitor removed.  Reviewed discharge instructions with patient, he verbalizes understanding.   To lobby per wheelchair.

## 2021-07-25 ENCOUNTER — Emergency Department (HOSPITAL_COMMUNITY): Payer: Self-pay

## 2021-07-25 ENCOUNTER — Encounter (HOSPITAL_COMMUNITY): Payer: Self-pay

## 2021-07-25 ENCOUNTER — Emergency Department (HOSPITAL_COMMUNITY)
Admission: EM | Admit: 2021-07-25 | Discharge: 2021-07-26 | Disposition: A | Discharge status: Home / Self Care | Payer: Self-pay | Attending: Student | Admitting: Student

## 2021-07-25 DIAGNOSIS — W1839XA Other fall on same level, initial encounter: Secondary | ICD-10-CM | POA: Insufficient documentation

## 2021-07-25 DIAGNOSIS — F17210 Nicotine dependence, cigarettes, uncomplicated: Secondary | ICD-10-CM | POA: Insufficient documentation

## 2021-07-25 DIAGNOSIS — Y92009 Unspecified place in unspecified non-institutional (private) residence as the place of occurrence of the external cause: Secondary | ICD-10-CM | POA: Insufficient documentation

## 2021-07-25 DIAGNOSIS — Y9301 Activity, walking, marching and hiking: Secondary | ICD-10-CM | POA: Insufficient documentation

## 2021-07-25 DIAGNOSIS — M5442 Lumbago with sciatica, left side: Secondary | ICD-10-CM | POA: Insufficient documentation

## 2021-07-25 LAB — BASIC METABOLIC PANEL
Anion gap: 9 (ref 5–15)
BUN: 8 mg/dL (ref 6–20)
CO2: 26 mmol/L (ref 22–32)
Calcium: 9.9 mg/dL (ref 8.9–10.3)
Chloride: 102 mmol/L (ref 98–111)
Creatinine, Ser: 0.99 mg/dL (ref 0.61–1.24)
GFR, Estimated: >60 mL/min (ref >60)
Glucose, Bld: 86 mg/dL (ref 70–99)
Potassium: 3.8 mmol/L (ref 3.5–5.1)
Sodium: 137 mmol/L (ref 135–145)

## 2021-07-25 LAB — CBC
HCT: 37.7 % — ABNORMAL LOW (ref 39.0–52.0)
Hemoglobin: 13.1 g/dL (ref 13.0–17.0)
MCH: 33.5 pg (ref 26.0–34.0)
MCHC: 34.7 g/dL (ref 30.0–36.0)
MCV: 96.4 fL (ref 80.0–100.0)
Platelets: 389 10*3/uL (ref 150–400)
RBC: 3.91 MIL/uL — ABNORMAL LOW (ref 4.22–5.81)
RDW: 14.5 % (ref 11.5–15.5)
WBC: 10.9 10*3/uL — ABNORMAL HIGH (ref 4.0–10.5)
nRBC: 0 % (ref 0.0–0.2)

## 2021-07-25 NOTE — ED Triage Notes (Signed)
Pt bib ems for a fall.pt was walking in his brothers house, the floor gave out and he fell into the basement, about 3 ft.Pt c.o pain down his spine, and bilateral knees as hit fell onto his knees.No loc,did not hit his head.pt a.o

## 2021-07-25 NOTE — ED Provider Notes (Signed)
Emergency Medicine Provider Triage Evaluation Note  Dwayne Bolton , a 45 y.o. male  was evaluated in triage.  Patient presents with EMS for evaluation after a fall for back pain and bilateral knee pain.  He was unable to ambulate with paramedics after the fall.  Patient was on the ground for 30 minutes prior to being assisted up.  Patient reports he was getting out of the shower when the floor underneath him gave out and he fell to the ground with a 3 foot drop.  He is unsure what kind of surface was at the bottom but he is covered in mud.  States he took his Keppra this morning, he takes this twice a day.  Review of Systems  Positive: Back pain, neck pain, knee pain Negative: Loss of consciousness, headache, visual change  Physical Exam  There were no vitals taken for this visit. Gen:   Awake, c spine in place. Appears uncomfortable Resp:  Normal effort  MSK:   C, T, L spine tenderness. Knee tenderness.  Other:    Medical Decision Making  Medically screening exam initiated at 5:39 PM.  Appropriate orders placed.  Mael Delap was informed that the remainder of the evaluation will be completed by another provider, this initial triage assessment does not replace that evaluation, and the importance of remaining in the ED until their evaluation is complete.  fall   Dwayne [place], PA-C 07/25/21 1755    Glendora Score, MD 07/25/21 [phone]

## 2021-07-26 ENCOUNTER — Emergency Department (HOSPITAL_COMMUNITY): Payer: Self-pay

## 2021-07-26 MED ORDER — LORAZEPAM 1 MG PO TABS
1.0000 mg | ORAL_TABLET | Freq: Every day | ORAL | Status: DC | PRN
  Administered 2021-07-26: 1 mg via ORAL
  Filled 2021-07-26: qty 1

## 2021-07-26 MED ORDER — ACETAMINOPHEN 500 MG PO TABS: 1000.0000 mg | ORAL_TABLET | Freq: Once | ORAL | Status: DC

## 2021-07-26 MED ORDER — KETOROLAC TROMETHAMINE 60 MG/2ML IM SOLN
30.0000 mg | Freq: Once | INTRAMUSCULAR | Status: DC
  Filled 2021-07-26: qty 2

## 2021-07-26 MED ORDER — LEVETIRACETAM 500 MG PO TABS
500.0000 mg | ORAL_TABLET | Freq: Two times a day (BID) | ORAL | Status: DC
  Administered 2021-07-26: 500 mg via ORAL
  Filled 2021-07-26: qty 1

## 2021-07-26 MED ORDER — NAPROXEN 500 MG PO TABS: 500.0000 mg | ORAL_TABLET | Freq: Two times a day (BID) | ORAL | 0 refills | Status: DC

## 2021-07-26 MED ORDER — LIDOCAINE 5 % EX PTCH: 1.0000 | MEDICATED_PATCH | CUTANEOUS | 0 refills | Status: DC

## 2021-07-26 MED ORDER — CYCLOBENZAPRINE HCL 10 MG PO TABS: 10.0000 mg | ORAL_TABLET | Freq: Two times a day (BID) | ORAL | 0 refills | Status: DC | PRN

## 2021-07-26 NOTE — ED Notes (Signed)
Pt sleep under Blanket in lobby

## 2021-07-26 NOTE — ED Provider Notes (Signed)
[facility] EMERGENCY DEPARTMENT Provider Note   CSN: [id number] Arrival date & time: 07/25/21  1737     History Chief Complaint  Patient presents with   Dwayne Bolton is a 45 y.o. male.   Fall Pertinent negatives include no chest pain, no abdominal pain and no shortness of breath.   45 year old male presenting to the emergency department with back pain after a fall.  Briefly, the patient states that he fell through the floor of the house when getting out of the shower.  He ended up through the floorboards up to his chest.  He landed in mode somewhat.  He has a history of seizure disorder and took his Keppra which he takes twice daily.  Since the fall he endorses sharp lumbar back pain with mild radiation down his left leg.  He endorses myalgias.  Also endorsed arthralgias in the bilateral knees.  He states that his left finger and right great toe also hurt.  He states that he has not tried ambulation and is unsure if he will be able to.  He denies any head trauma or loss of consciousness.  He states that he has had no urinary or fecal incontinence, no saddle anesthesia, no lower extremity weakness.  Past Medical History:  Diagnosis Date   Alcohol abuse    Seizure [facility])     Patient Active Problem List   Diagnosis Date Noted   Seizure (HCC) 07/11/2021   Alcohol withdrawal delirium (HCC) 07/09/2021   Acute lower UTI 07/09/2021   Tobacco dependence 12/31/2015   Seizure disorder (HCC) 12/31/2015   Convulsion (HCC)    Alcohol withdrawal seizure (HCC) 03/22/2015   GAD (generalized anxiety disorder) 01/13/2014   Adjustment disorder with mixed anxiety and depressed mood 01/13/2014   Seizures (HCC) 01/12/2014   Elevated liver enzymes 01/12/2014   Alcohol dependence (HCC) 01/11/2014    History reviewed. No pertinent surgical history.     Family History  Problem Relation Age of Onset   Diabetes Mother    Hypertension Mother     Social History    Tobacco Use   Smoking status: Every Day    Packs/day: 0.25    Years: 4.00    Pack years: 1.00    Types: Cigarettes   Smokeless tobacco: Former  Building services engineer Use: Never used  Substance Use Topics   Alcohol use: Yes   Drug use: No    Home Medications Prior to Admission medications   Medication Sig Start Date End Date Taking? Authorizing Provider  cyclobenzaprine (FLEXERIL) 10 MG tablet Take 1 tablet (10 mg total) by mouth 2 (two) times daily as needed for muscle spasms. 07/26/21  Yes Ernie Avena, MD  lidocaine (LIDODERM) 5 % Place 1 patch onto the skin daily. Remove & Discard patch within 12 hours or as directed by MD 07/26/21  Yes Ernie Avena, MD  naproxen (NAPROSYN) 500 MG tablet Take 1 tablet (500 mg total) by mouth 2 (two) times daily. 07/26/21  Yes Ernie Avena, MD  ibuprofen (ADVIL) 600 MG tablet Take 1 tablet (600 mg total) by mouth every 6 (six) hours as needed. 03/13/20   Fayrene Helper, PA-C  levETIRAcetam (KEPPRA) 500 MG tablet Take 1 tablet (500 mg total) by mouth 2 (two) times daily. 07/12/21   Rai, Ripudeep K, MD  LORazepam (ATIVAN) 1 MG tablet Take 1 tablet (1 mg total) by mouth daily as needed for anxiety or seizure. 07/12/21   Rai, Ripudeep K,  MD  thiamine 100 MG tablet Take 1 tablet (100 mg total) by mouth daily. 07/12/21   Cathren Harsh, MD    Allergies    Hydrocodone-acetaminophen and Percocet [oxycodone-acetaminophen]  Review of Systems   Review of Systems  Constitutional:  Negative for chills and fever.  HENT:  Negative for ear pain and sore throat.   Eyes:  Negative for pain and visual disturbance.  Respiratory:  Negative for cough and shortness of breath.   Cardiovascular:  Negative for chest pain and palpitations.  Gastrointestinal:  Negative for abdominal pain and vomiting.  Genitourinary:  Negative for dysuria and hematuria.  Musculoskeletal:  Positive for arthralgias, back pain, myalgias and neck pain.  Skin:  Negative for color change and  rash.  Neurological:  Negative for seizures and syncope.  All other systems reviewed and are negative.  Physical Exam Updated Vital Signs BP (!) 132/96 (BP Location: Right Arm)   Pulse 65   Temp 97.9 F (36.6 C) (Oral)   Resp 14   Ht 5\' 11"  (1.803 m)   Wt 88.5 kg   SpO2 100%   BMI 27.21 kg/m   Physical Exam Vitals and nursing note reviewed.  Constitutional:      Appearance: He is well-developed.     Comments: GCS 15, ABC intact  HENT:     Head: Normocephalic.  Eyes:     Conjunctiva/sclera: Conjunctivae normal.  Neck:     Comments: No midline tenderness to palpation of the cervical spine. ROM intact. Cardiovascular:     Rate and Rhythm: Normal rate and regular rhythm.     Heart sounds: No murmur heard. Pulmonary:     Effort: Pulmonary effort is normal. No respiratory distress.     Breath sounds: Normal breath sounds.  Chest:     Comments: Chest wall stable and non-tender to AP and lateral compression. Clavicles stable and non-tender to AP compression Abdominal:     Palpations: Abdomen is soft.     Tenderness: There is no abdominal tenderness.     Comments: Pelvis stable to lateral compression.  Musculoskeletal:     Cervical back: Neck supple.     Comments: No midline tenderness to palpation of the thoracic or lumbar spine. Tenderness of the left index finger with mild swelling about the MCP joint. Tenderness of the right great toe. Paraspinal TTP of the cervical and lumbar spine. Positive straight leg raise test on the left  Skin:    General: Skin is warm and dry.  Neurological:     Mental Status: He is alert.     Comments: CN II-XII grossly intact. Moving all four extremities spontaneously and sensation grossly intact. 5/5 strength in the LE bilaterally.    ED Results / Procedures / Treatments   Labs (all labs ordered are listed, but only abnormal results are displayed) Labs Reviewed  CBC - Abnormal; Notable for the following components:      Result Value   WBC  10.9 (*)    RBC 3.91 (*)    HCT 37.7 (*)    All other components within normal limits  BASIC METABOLIC PANEL    EKG None  Radiology CT Cervical Spine Wo Contrast  Result Date: 07/25/2021 CLINICAL DATA:  Spine fracture, cervical, traumatic fall. spinous process tenderness.; Low back pain, trauma fall. spinous process tenderness.; Mid-back pain following fall. Spinous process tenderness. EXAM: CT CERVICAL, THORACIC, AND LUMBAR SPINE WITHOUT CONTRAST TECHNIQUE: Multidetector CT imaging of the cervical, thoracic and lumbar spine was performed without  intravenous contrast. Multiplanar CT image reconstructions were also generated. COMPARISON:  None. FINDINGS: CT CERVICAL SPINE FINDINGS Alignment: Normal. Skull base and vertebrae: No acute fracture. No primary bone lesion or focal pathologic process. Soft tissues and spinal canal: No prevertebral fluid or swelling. No visible canal hematoma. There is shotty left submandibular adenopathy with several lymph nodes measuring up to 9 mm in short axis diameter, possibly reactive or inflammatory. This is not well assessed on this examination. Disc levels: Intervertebral disc spaces are preserved. The prevertebral soft tissues are not thickened. The spinal canal is widely patent. No significant neuroforaminal narrowing. No significant uncovertebral or facet arthrosis. Upper chest: Unremarkable CT THORACIC SPINE FINDINGS Alignment: Normal. Vertebrae: No acute fracture or focal pathologic process. Paraspinal and other soft tissues: Negative. Disc levels: Intervertebral disc heights are preserved. The spinal canal is widely patent. No significant neuroforaminal narrowing. No significant facet arthrosis. CT LUMBAR SPINE FINDINGS Segmentation: 5 lumbar type vertebrae. Alignment: Normal. Vertebrae: No acute fracture or focal pathologic process. Paraspinal and other soft tissues: Negative. Disc levels: Intervertebral disc heights have been preserved. Mild broad-based disc  bulge at L3-4 in combination with mild hypertrophy of the lamina propria results in mild central canal stenosis. Similar changes are noted at L4-5. Mild right facet arthrosis noted at L4-5 and L5-S1. No significant neuroforaminal narrowing. IMPRESSION: No acute fracture or listhesis of the cervical, thoracic, or lumbar spine. Mild broad-based disc bulge L3-4 and L4-5 with mild resultant central canal stenosis. Mild left submandibular shotty adenopathy, nonspecific, possibly reactive or inflammatory. Electronically Signed   By: Helyn Numbers M.D.   On: 07/25/2021 22:20   CT Thoracic Spine Wo Contrast  Result Date: 07/25/2021 CLINICAL DATA:  Spine fracture, cervical, traumatic fall. spinous process tenderness.; Low back pain, trauma fall. spinous process tenderness.; Mid-back pain following fall. Spinous process tenderness. EXAM: CT CERVICAL, THORACIC, AND LUMBAR SPINE WITHOUT CONTRAST TECHNIQUE: Multidetector CT imaging of the cervical, thoracic and lumbar spine was performed without intravenous contrast. Multiplanar CT image reconstructions were also generated. COMPARISON:  None. FINDINGS: CT CERVICAL SPINE FINDINGS Alignment: Normal. Skull base and vertebrae: No acute fracture. No primary bone lesion or focal pathologic process. Soft tissues and spinal canal: No prevertebral fluid or swelling. No visible canal hematoma. There is shotty left submandibular adenopathy with several lymph nodes measuring up to 9 mm in short axis diameter, possibly reactive or inflammatory. This is not well assessed on this examination. Disc levels: Intervertebral disc spaces are preserved. The prevertebral soft tissues are not thickened. The spinal canal is widely patent. No significant neuroforaminal narrowing. No significant uncovertebral or facet arthrosis. Upper chest: Unremarkable CT THORACIC SPINE FINDINGS Alignment: Normal. Vertebrae: No acute fracture or focal pathologic process. Paraspinal and other soft tissues: Negative.  Disc levels: Intervertebral disc heights are preserved. The spinal canal is widely patent. No significant neuroforaminal narrowing. No significant facet arthrosis. CT LUMBAR SPINE FINDINGS Segmentation: 5 lumbar type vertebrae. Alignment: Normal. Vertebrae: No acute fracture or focal pathologic process. Paraspinal and other soft tissues: Negative. Disc levels: Intervertebral disc heights have been preserved. Mild broad-based disc bulge at L3-4 in combination with mild hypertrophy of the lamina propria results in mild central canal stenosis. Similar changes are noted at L4-5. Mild right facet arthrosis noted at L4-5 and L5-S1. No significant neuroforaminal narrowing. IMPRESSION: No acute fracture or listhesis of the cervical, thoracic, or lumbar spine. Mild broad-based disc bulge L3-4 and L4-5 with mild resultant central canal stenosis. Mild left submandibular shotty adenopathy, nonspecific, possibly reactive or inflammatory.  Electronically Signed   By: Helyn Numbers M.D.   On: 07/25/2021 22:20   CT Lumbar Spine Wo Contrast  Result Date: 07/25/2021 CLINICAL DATA:  Spine fracture, cervical, traumatic fall. spinous process tenderness.; Low back pain, trauma fall. spinous process tenderness.; Mid-back pain following fall. Spinous process tenderness. EXAM: CT CERVICAL, THORACIC, AND LUMBAR SPINE WITHOUT CONTRAST TECHNIQUE: Multidetector CT imaging of the cervical, thoracic and lumbar spine was performed without intravenous contrast. Multiplanar CT image reconstructions were also generated. COMPARISON:  None. FINDINGS: CT CERVICAL SPINE FINDINGS Alignment: Normal. Skull base and vertebrae: No acute fracture. No primary bone lesion or focal pathologic process. Soft tissues and spinal canal: No prevertebral fluid or swelling. No visible canal hematoma. There is shotty left submandibular adenopathy with several lymph nodes measuring up to 9 mm in short axis diameter, possibly reactive or inflammatory. This is not well  assessed on this examination. Disc levels: Intervertebral disc spaces are preserved. The prevertebral soft tissues are not thickened. The spinal canal is widely patent. No significant neuroforaminal narrowing. No significant uncovertebral or facet arthrosis. Upper chest: Unremarkable CT THORACIC SPINE FINDINGS Alignment: Normal. Vertebrae: No acute fracture or focal pathologic process. Paraspinal and other soft tissues: Negative. Disc levels: Intervertebral disc heights are preserved. The spinal canal is widely patent. No significant neuroforaminal narrowing. No significant facet arthrosis. CT LUMBAR SPINE FINDINGS Segmentation: 5 lumbar type vertebrae. Alignment: Normal. Vertebrae: No acute fracture or focal pathologic process. Paraspinal and other soft tissues: Negative. Disc levels: Intervertebral disc heights have been preserved. Mild broad-based disc bulge at L3-4 in combination with mild hypertrophy of the lamina propria results in mild central canal stenosis. Similar changes are noted at L4-5. Mild right facet arthrosis noted at L4-5 and L5-S1. No significant neuroforaminal narrowing. IMPRESSION: No acute fracture or listhesis of the cervical, thoracic, or lumbar spine. Mild broad-based disc bulge L3-4 and L4-5 with mild resultant central canal stenosis. Mild left submandibular shotty adenopathy, nonspecific, possibly reactive or inflammatory. Electronically Signed   By: Helyn Numbers M.D.   On: 07/25/2021 22:20   DG Knee Complete 4 Views Left  Result Date: 07/25/2021 CLINICAL DATA:  Fall, right knee injury EXAM: LEFT KNEE - COMPLETE 4+ VIEW COMPARISON:  None. FINDINGS: No evidence of fracture, dislocation, or joint effusion. No evidence of arthropathy or other focal bone abnormality. Soft tissues are unremarkable. IMPRESSION: Negative. Electronically Signed   By: Helyn Numbers M.D.   On: 07/25/2021 19:55   DG Knee Complete 4 Views Right  Result Date: 07/25/2021 CLINICAL DATA:  Recent fall with knee  pain, initial encounter EXAM: RIGHT KNEE - COMPLETE 4+ VIEW COMPARISON:  None. FINDINGS: No evidence of fracture, dislocation, or joint effusion. No evidence of arthropathy or other focal bone abnormality. Soft tissues are unremarkable. IMPRESSION: No acute abnormality noted. Electronically Signed   By: Alcide Clever M.D.   On: 07/25/2021 19:54   DG Finger Index Left  Result Date: 07/26/2021 CLINICAL DATA:  45 year old male with history of trauma from a fall complaining of right index finger pain. EXAM: LEFT INDEX FINGER 2+V COMPARISON:  No priors. FINDINGS: There is no evidence of fracture or dislocation. There is no evidence of arthropathy or other focal bone abnormality. Soft tissues are unremarkable. IMPRESSION: Negative. Electronically Signed   By: Trudie Reed M.D.   On: 07/26/2021 09:50   DG Foot Complete Right  Result Date: 07/26/2021 CLINICAL DATA:  A 45 year old male presents with RIGHT foot injury in LEFT index finger pain by report. EXAM: RIGHT FOOT  COMPLETE - 3+ VIEW COMPARISON:  None FINDINGS: Mild midfoot degenerative changes. No signs of acute fracture or dislocation. No substantial soft tissue swelling. IMPRESSION: Mild midfoot degenerative changes without acute fracture. Electronically Signed   By: Donzetta Kohut M.D.   On: 07/26/2021 09:53    Procedures Procedures   Medications Ordered in ED Medications  levETIRAcetam (KEPPRA) tablet 500 mg (500 mg Oral Given 07/26/21 0948)  LORazepam (ATIVAN) tablet 1 mg (1 mg Oral Given 07/26/21 0949)  acetaminophen (TYLENOL) tablet 1,000 mg (has no administration in time range)    ED Course  I have reviewed the triage vital signs and the nursing notes.  Pertinent labs & imaging results that were available during my care of the patient were reviewed by me and considered in my medical decision making (see chart for details).    MDM Rules/Calculators/A&P                           45 year old male presenting to the emergency  department with back pain after a fall.  Briefly, the patient states that he fell through the floor of the house when getting out of the shower.  He ended up through the floorboards up to his chest.  He landed in mode somewhat.  He has a history of seizure disorder and took his Keppra which he takes twice daily.  Since the fall he endorses sharp lumbar back pain with mild radiation down his left leg.  He endorses myalgias.  Also endorsed arthralgias in the bilateral knees.  He states that his left finger and right great toe also hurt.  He states that he has not tried ambulation and is unsure if he will be able to.  He denies any head trauma or loss of consciousness.  He states that he has had no urinary or fecal incontinence, no saddle anesthesia, no lower extremity weakness.  On arrival, the patient was afebrile, hemodynamically stable, GCS 15, ABC intact.  Complains primarily of low back pain after a fall through floor.  No head trauma or loss of consciousness, no headache or vision changes, no nausea or vomiting, GCS 15.  CT imaging of the cervical, thoracic and lumbar spine was performed with no acute fracture of the spine noted, mild broad-based disc bulge L3-L4 and L4-L5 with mild resultant central canal stenosis.  X-ray imaging of the left finger and right foot negative for acute fractures.  Patient was ambulatory in the emergency department and pain was well controlled with oral Tylenol.  Overall stable for outpatient follow-up regarding his lower lumbar back pain.    Recommended the following: Return to the emergency department for any new onset weakness in the lower extremities, saddle numbness in your groin, loss of bowel or bladder function. Follow-up outpatient with a primary physician to discuss long term management of bulging discs. Recommend rest, ice/heat, and NSAIDs for pain control.   Final Clinical Impression(s) / ED Diagnoses Final diagnoses:  Acute midline low back pain with  left-sided sciatica    Rx / DC Orders ED Discharge Orders          Ordered    lidocaine (LIDODERM) 5 %  Every 24 hours        07/26/21 0933    cyclobenzaprine (FLEXERIL) 10 MG tablet  2 times daily PRN        07/26/21 0933    naproxen (NAPROSYN) 500 MG tablet  2 times daily  07/26/21 7017             Ernie Avena, MD 07/26/21 1006

## 2021-07-26 NOTE — Discharge Instructions (Addendum)
Your CT scan revealed the following:  Mild broad-based disc bulge L3-4 and L4-5 with mild resultant  central canal stenosis.    Return to the emergency department for any new onset weakness in the lower extremities, saddle numbness in your groin, loss of bowel or bladder function. Follow-up outpatient with a primary physician to discuss long term management of bulging discs. Recommend rest, ice/heat, and NSAIDs for pain control.

## 2021-08-14 ENCOUNTER — Other Ambulatory Visit: Payer: Self-pay

## 2021-08-14 ENCOUNTER — Ambulatory Visit (INDEPENDENT_AMBULATORY_CARE_PROVIDER_SITE_OTHER): Payer: Self-pay | Admitting: Primary Care

## 2021-08-14 ENCOUNTER — Encounter (INDEPENDENT_AMBULATORY_CARE_PROVIDER_SITE_OTHER): Payer: Self-pay | Admitting: Primary Care

## 2021-08-14 VITALS — BP 118/81 | HR 86 | Temp 97.3°F | Ht 71.0 in | Wt 181.2 lb

## 2021-08-14 DIAGNOSIS — F17210 Nicotine dependence, cigarettes, uncomplicated: Secondary | ICD-10-CM

## 2021-08-14 DIAGNOSIS — R569 Unspecified convulsions: Secondary | ICD-10-CM

## 2021-08-14 DIAGNOSIS — R52 Pain, unspecified: Secondary | ICD-10-CM

## 2021-08-14 DIAGNOSIS — F17200 Nicotine dependence, unspecified, uncomplicated: Secondary | ICD-10-CM

## 2021-08-14 DIAGNOSIS — Z7689 Persons encountering health services in other specified circumstances: Secondary | ICD-10-CM

## 2021-08-14 DIAGNOSIS — Z09 Encounter for follow-up examination after completed treatment for conditions other than malignant neoplasm: Secondary | ICD-10-CM

## 2021-08-14 NOTE — Progress Notes (Signed)
[facility]   Subjective:   Dwayne Bolton is a 45 y.o. male presents for hospital follow up and establish care. Patient presented to the emergency room  states that he fell through the floor of the house when getting out of the shower.  He ended up through the floorboards up to his chest. Admit date to the hospital was 07/25/21, patient was discharged from the hospital on 07/26/21, patient was admitted for: Acute midline low back pain with left-sided sciatica. Denies shortness of breath, headaches, chest pain or lower extremity edema  He states he has seizure with and without alcohol use. Main concern is lower back pain on his left side that radiated down his leg. Pain 10/10 nothing alleviates the pain taking ibuprofen- eating like candy. Aggravating factors  moving around. Described as aching and sharp pain.  Past Medical History:  Diagnosis Date   Alcohol abuse    Seizure (HCC)      Allergies  Allergen Reactions   Hydrocodone-Acetaminophen     hives   Percocet [Oxycodone-Acetaminophen]     Hives       Current Outpatient Medications on File Prior to Visit  Medication Sig Dispense Refill   cyclobenzaprine (FLEXERIL) 10 MG tablet Take 1 tablet (10 mg total) by mouth 2 (two) times daily as needed for muscle spasms. 20 tablet 0   ibuprofen (ADVIL) 600 MG tablet Take 1 tablet (600 mg total) by mouth every 6 (six) hours as needed. 30 tablet 0   levETIRAcetam (KEPPRA) 500 MG tablet Take 1 tablet (500 mg total) by mouth 2 (two) times daily. 60 tablet 3   lidocaine (LIDODERM) 5 % Place 1 patch onto the skin daily. Remove & Discard patch within 12 hours or as directed by MD 30 patch 0   naproxen (NAPROSYN) 500 MG tablet Take 1 tablet (500 mg total) by mouth 2 (two) times daily. 30 tablet 0   thiamine 100 MG tablet Take 1 tablet (100 mg total) by mouth daily. 30 tablet 1   LORazepam (ATIVAN) 1 MG tablet Take 1 tablet (1 mg total) by mouth daily as needed for anxiety or seizure.  (Patient not taking: Reported on 08/14/2021) 15 tablet 0   No current facility-administered medications on file prior to visit.  Family Hx-  Diabetes -father Crist Infante   Review of System: Comprehensive ROS pertinent positive and negatives noted in HPI  Objective:  BP 118/81 (BP Location: Left Arm, Patient Position: Sitting, Cuff Size: Normal)   Pulse 86   Temp (!) 97.3 F (36.3 C) (Temporal)   Ht 5\' 11"  (1.803 m)   Wt 181 lb 3.2 oz (82.2 kg)   SpO2 98%   BMI 25.27 kg/m   Filed Weights   08/14/21 1336  Weight: 181 lb 3.2 oz (82.2 kg)   Physical Exam: General Appearance: Well nourished, in no apparent distress. Eyes: PERRLA, EOMs, conjunctiva no swelling or erythema Sinuses: No Frontal/maxillary tenderness ENT/Mouth: Ext aud canals clear, TMs without erythema, bulging. No erythema, swelling, or exudate on post pharynx.  Tonsils not swollen or erythematous. Hearing normal.  Neck: Supple, thyroid normal.  Respiratory: Respiratory effort normal, BS equal bilaterally without rales, rhonchi, wheezing or stridor.  Cardio: RRR with no MRGs. Brisk peripheral pulses without edema.  Abdomen: Soft, + BS.  Non tender, no guarding, rebound, hernias, masses. Lymphatics: Non tender without lymphadenopathy.  Musculoskeletal: abnormal gait uses a crutch for stability of left leg Skin: Warm, dry without rashes, lesions, ecchymosis.  Neuro: Cranial nerves intact. Normal  muscle tone, no cerebellar symptoms. Sensation intact.  Psych: Awake and oriented X 3, normal affect, Insight and Judgment appropriate.    Assessment:  [name] was seen today for hospitalization follow-up.  Diagnoses and all orders for this visit:  Tobacco dependence - I have recommended complete cessation of tobacco use. I have discussed various options available for assistance with tobacco cessation including over the counter methods (Nicotine gum, patch and lozenges). We also discussed prescription options (Chantix, Nicotine  Inhaler / Nasal Spray). The patient is not interested in pursuing any prescription tobacco cessation options at this time. - Patient declines at this time.   Seizure [facility]) Not manage by PCP will place referral to neurology   Hospital discharge follow-up No recommendation except Follow-Ups: Schedule an appointment with [facility]  Acute pain S/P fall imaging  Mild broad-based disc bulge at L3-4 in combination with mild hypertrophy of the lamina propria results in mild central canal stenosis. Similar changes are noted at L4-5. Mild right facet arthrosis noted at L4-5 and L5-S1. Refer to orthopedics   Encounter to establish care Establish care with PCP     No orders of the defined types were placed in this encounter.   This note has been created with Education officer, environmental. Any transcriptional errors are unintentional.   Grayce Sessions, NP 08/14/2021, 1:47 PM

## 2021-08-14 NOTE — Patient Instructions (Signed)
Apply for financial assistance referrals placed.

## 2021-08-22 ENCOUNTER — Ambulatory Visit (INDEPENDENT_AMBULATORY_CARE_PROVIDER_SITE_OTHER): Payer: Self-pay | Admitting: Neurology

## 2021-08-22 ENCOUNTER — Other Ambulatory Visit: Payer: Self-pay

## 2021-08-22 ENCOUNTER — Encounter: Payer: Self-pay | Admitting: Neurology

## 2021-08-22 VITALS — BP 120/81 | HR 102 | Ht 71.0 in | Wt 185.0 lb

## 2021-08-22 DIAGNOSIS — G40309 Generalized idiopathic epilepsy and epileptic syndromes, not intractable, without status epilepticus: Secondary | ICD-10-CM

## 2021-08-22 DIAGNOSIS — F1011 Alcohol abuse, in remission: Secondary | ICD-10-CM

## 2021-08-22 MED ORDER — LORAZEPAM 1 MG PO TABS: 1.0000 mg | ORAL_TABLET | Freq: Every day | ORAL | 5 refills | Status: DC | PRN

## 2021-08-22 MED ORDER — LEVETIRACETAM 500 MG PO TABS: 500.0000 mg | ORAL_TABLET | Freq: Two times a day (BID) | ORAL | 11 refills | Status: DC
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
## 2021-12-07 ENCOUNTER — Emergency Department (HOSPITAL_COMMUNITY)
Admission: EM | Admit: 2021-12-07 | Discharge: 2021-12-08 | Disposition: A | Discharge status: Home / Self Care | Payer: Self-pay | Attending: Emergency Medicine | Admitting: Emergency Medicine

## 2021-12-07 ENCOUNTER — Other Ambulatory Visit: Payer: Self-pay

## 2021-12-07 ENCOUNTER — Encounter (HOSPITAL_COMMUNITY): Payer: Self-pay | Admitting: Emergency Medicine

## 2021-12-07 DIAGNOSIS — R519 Headache, unspecified: Secondary | ICD-10-CM | POA: Insufficient documentation

## 2021-12-07 DIAGNOSIS — S01512A Laceration without foreign body of oral cavity, initial encounter: Secondary | ICD-10-CM | POA: Insufficient documentation

## 2021-12-07 DIAGNOSIS — Y908 Blood alcohol level of 240 mg/100 ml or more: Secondary | ICD-10-CM | POA: Insufficient documentation

## 2021-12-07 DIAGNOSIS — X58XXXA Exposure to other specified factors, initial encounter: Secondary | ICD-10-CM | POA: Insufficient documentation

## 2021-12-07 DIAGNOSIS — J45909 Unspecified asthma, uncomplicated: Secondary | ICD-10-CM | POA: Insufficient documentation

## 2021-12-07 DIAGNOSIS — Z79899 Other long term (current) drug therapy: Secondary | ICD-10-CM | POA: Insufficient documentation

## 2021-12-07 DIAGNOSIS — F10929 Alcohol use, unspecified with intoxication, unspecified: Secondary | ICD-10-CM

## 2021-12-07 DIAGNOSIS — F10129 Alcohol abuse with intoxication, unspecified: Secondary | ICD-10-CM | POA: Insufficient documentation

## 2021-12-07 DIAGNOSIS — K029 Dental caries, unspecified: Secondary | ICD-10-CM | POA: Insufficient documentation

## 2021-12-07 DIAGNOSIS — R569 Unspecified convulsions: Secondary | ICD-10-CM | POA: Insufficient documentation

## 2021-12-07 LAB — CBC WITH DIFFERENTIAL/PLATELET
Abs Immature Granulocytes: 0.02 10*3/uL (ref 0.00–0.07)
Basophils Absolute: 0.1 10*3/uL (ref 0.0–0.1)
Basophils Relative: 1 %
Eosinophils Absolute: 0 10*3/uL (ref 0.0–0.5)
Eosinophils Relative: 0 %
HCT: 43.3 % (ref 39.0–52.0)
Hemoglobin: 15.2 g/dL (ref 13.0–17.0)
Immature Granulocytes: 0 %
Lymphocytes Relative: 32 %
Lymphs Abs: 2.7 10*3/uL (ref 0.7–4.0)
MCH: 31.3 pg (ref 26.0–34.0)
MCHC: 35.1 g/dL (ref 30.0–36.0)
MCV: 89.3 fL (ref 80.0–100.0)
Monocytes Absolute: 0.6 10*3/uL (ref 0.1–1.0)
Monocytes Relative: 8 %
Neutro Abs: 5 10*3/uL (ref 1.7–7.7)
Neutrophils Relative %: 59 %
Platelets: 144 10*3/uL — ABNORMAL LOW (ref 150–400)
RBC: 4.85 MIL/uL (ref 4.22–5.81)
RDW: 13.1 % (ref 11.5–15.5)
WBC: 8.4 10*3/uL (ref 4.0–10.5)
nRBC: 0 % (ref 0.0–0.2)

## 2021-12-07 NOTE — ED Notes (Signed)
MSE signed by this RN at patient's request.
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
## 2021-12-08 ENCOUNTER — Emergency Department (HOSPITAL_COMMUNITY): Payer: Self-pay

## 2021-12-08 LAB — COMPREHENSIVE METABOLIC PANEL
ALT: 61 U/L — ABNORMAL HIGH (ref 0–44)
AST: 101 U/L — ABNORMAL HIGH (ref 15–41)
Albumin: 4.6 g/dL (ref 3.5–5.0)
Alkaline Phosphatase: 89 U/L (ref 38–126)
Anion gap: 15 (ref 5–15)
BUN: 14 mg/dL (ref 6–20)
CO2: 26 mmol/L (ref 22–32)
Calcium: 8.9 mg/dL (ref 8.9–10.3)
Chloride: 95 mmol/L — ABNORMAL LOW (ref 98–111)
Creatinine, Ser: 0.91 mg/dL (ref 0.61–1.24)
GFR, Estimated: >60 mL/min (ref >60)
Glucose, Bld: 93 mg/dL (ref 70–99)
Potassium: 4 mmol/L (ref 3.5–5.1)
Sodium: 136 mmol/L (ref 135–145)
Total Bilirubin: 1.2 mg/dL (ref 0.3–1.2)
Total Protein: 8 g/dL (ref 6.5–8.1)

## 2021-12-08 LAB — URINALYSIS, ROUTINE W REFLEX MICROSCOPIC
Bacteria, UA: NONE SEEN
Bilirubin Urine: NEGATIVE
Glucose, UA: NEGATIVE mg/dL
Ketones, ur: 20 mg/dL — AB
Leukocytes,Ua: NEGATIVE
Nitrite: NEGATIVE
Protein, ur: 30 mg/dL — AB
Specific Gravity, Urine: 1.011 (ref 1.005–1.030)
pH: 5 (ref 5.0–8.0)

## 2021-12-08 LAB — ETHANOL: Alcohol, Ethyl (B): 369 mg/dL (ref <10)

## 2021-12-08 LAB — RAPID URINE DRUG SCREEN, HOSP PERFORMED
Amphetamines: NOT DETECTED
Barbiturates: NOT DETECTED
Benzodiazepines: NOT DETECTED
Cocaine: NOT DETECTED
Opiates: NOT DETECTED
Tetrahydrocannabinol: NOT DETECTED

## 2021-12-08 LAB — MAGNESIUM: Magnesium: 2.2 mg/dL (ref 1.7–2.4)

## 2021-12-08 MED ORDER — ONDANSETRON HCL 4 MG/2ML IJ SOLN
4.0000 mg | Freq: Once | INTRAMUSCULAR | Status: AC
  Administered 2021-12-08: 4 mg via INTRAVENOUS
  Filled 2021-12-08: qty 2

## 2021-12-08 MED ORDER — LEVETIRACETAM IN NACL 1000 MG/100ML IV SOLN
1000.0000 mg | Freq: Once | INTRAVENOUS | Status: AC
  Administered 2021-12-08: 1000 mg via INTRAVENOUS
  Filled 2021-12-08: qty 100

## 2021-12-08 MED ORDER — SODIUM CHLORIDE 0.9 % IV BOLUS
1000.0000 mL | Freq: Once | INTRAVENOUS | Status: AC
  Administered 2021-12-08: 1000 mL via INTRAVENOUS

## 2021-12-08 MED ORDER — FENTANYL CITRATE PF 50 MCG/ML IJ SOSY
50.0000 ug | PREFILLED_SYRINGE | Freq: Once | INTRAMUSCULAR | Status: AC
  Administered 2021-12-08: 50 ug via INTRAVENOUS
  Filled 2021-12-08: qty 1
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
## 2021-12-08 NOTE — Discharge Instructions (Addendum)
You were seen in the emergency department today after a seizure.  Your lab work has all looked reassuring today.  Your alcohol level was pretty high when you came in. We have hydrated you through your IV and I'm glad you're feeling better and have been able to eat.   I want you to continue taking your seizure medication as prescribed.  Continue to monitor how you are doing and return to the emergency department for any new or worsening symptoms.
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
## 2021-12-15 ENCOUNTER — Emergency Department (HOSPITAL_COMMUNITY)
Admission: EM | Admit: 2021-12-15 | Discharge: 2021-12-16 | Disposition: A | Discharge status: Home / Self Care | Payer: Self-pay | Attending: Emergency Medicine | Admitting: Emergency Medicine

## 2021-12-15 ENCOUNTER — Emergency Department (HOSPITAL_COMMUNITY): Payer: Self-pay

## 2021-12-15 ENCOUNTER — Encounter (HOSPITAL_COMMUNITY): Payer: Self-pay | Admitting: Emergency Medicine

## 2021-12-15 ENCOUNTER — Other Ambulatory Visit: Payer: Self-pay

## 2021-12-15 DIAGNOSIS — F10920 Alcohol use, unspecified with intoxication, uncomplicated: Secondary | ICD-10-CM

## 2021-12-15 DIAGNOSIS — F10229 Alcohol dependence with intoxication, unspecified: Secondary | ICD-10-CM | POA: Insufficient documentation

## 2021-12-15 DIAGNOSIS — Z79899 Other long term (current) drug therapy: Secondary | ICD-10-CM | POA: Insufficient documentation

## 2021-12-15 DIAGNOSIS — R569 Unspecified convulsions: Secondary | ICD-10-CM | POA: Insufficient documentation

## 2021-12-15 DIAGNOSIS — X58XXXA Exposure to other specified factors, initial encounter: Secondary | ICD-10-CM | POA: Insufficient documentation

## 2021-12-15 DIAGNOSIS — S0083XA Contusion of other part of head, initial encounter: Secondary | ICD-10-CM | POA: Insufficient documentation

## 2021-12-15 LAB — CBC WITH DIFFERENTIAL/PLATELET
Abs Immature Granulocytes: 0.01 10*3/uL (ref 0.00–0.07)
Basophils Absolute: 0.1 10*3/uL (ref 0.0–0.1)
Basophils Relative: 2 %
Eosinophils Absolute: 0 10*3/uL (ref 0.0–0.5)
Eosinophils Relative: 1 %
HCT: 39.9 % (ref 39.0–52.0)
Hemoglobin: 14.1 g/dL (ref 13.0–17.0)
Immature Granulocytes: 0 %
Lymphocytes Relative: 29 %
Lymphs Abs: 1.7 10*3/uL (ref 0.7–4.0)
MCH: 31.8 pg (ref 26.0–34.0)
MCHC: 35.3 g/dL (ref 30.0–36.0)
MCV: 90.1 fL (ref 80.0–100.0)
Monocytes Absolute: 1.2 10*3/uL — ABNORMAL HIGH (ref 0.1–1.0)
Monocytes Relative: 20 %
Neutro Abs: 2.9 10*3/uL (ref 1.7–7.7)
Neutrophils Relative %: 48 %
Platelets: 181 10*3/uL (ref 150–400)
RBC: 4.43 MIL/uL (ref 4.22–5.81)
RDW: 13.5 % (ref 11.5–15.5)
WBC: 5.9 10*3/uL (ref 4.0–10.5)
nRBC: 0 % (ref 0.0–0.2)

## 2021-12-15 LAB — BASIC METABOLIC PANEL
Anion gap: 15 (ref 5–15)
BUN: 7 mg/dL (ref 6–20)
CO2: 28 mmol/L (ref 22–32)
Calcium: 8.7 mg/dL — ABNORMAL LOW (ref 8.9–10.3)
Chloride: 94 mmol/L — ABNORMAL LOW (ref 98–111)
Creatinine, Ser: 0.95 mg/dL (ref 0.61–1.24)
GFR, Estimated: >60 mL/min (ref >60)
Glucose, Bld: 125 mg/dL — ABNORMAL HIGH (ref 70–99)
Potassium: 3.5 mmol/L (ref 3.5–5.1)
Sodium: 137 mmol/L (ref 135–145)

## 2021-12-15 LAB — ETHANOL: Alcohol, Ethyl (B): 429 mg/dL (ref <10)

## 2021-12-15 MED ORDER — LEVETIRACETAM IN NACL 1500 MG/100ML IV SOLN
1500.0000 mg | Freq: Once | INTRAVENOUS | Status: AC
  Administered 2021-12-15: 1500 mg via INTRAVENOUS
  Filled 2021-12-15: qty 100

## 2021-12-15 MED ORDER — SODIUM CHLORIDE 0.9 % IV BOLUS
1000.0000 mL | Freq: Once | INTRAVENOUS | Status: AC
  Administered 2021-12-15: 1000 mL via INTRAVENOUS

## 2021-12-15 NOTE — Discharge Instructions (Addendum)
No driving until cleared by neurology

## 2021-12-15 NOTE — ED Triage Notes (Signed)
Pt reports history of seizures.  States he had 1 today and fell and hit back and L jaw.  Denies oral trauma and no urinary incontinence.  Reports prior seizure was a couple days ago.  Taking Keppra as prescribed.

## 2021-12-15 NOTE — ED Provider Notes (Signed)
[facility] EMERGENCY DEPARTMENT Provider Note   CSN: [id number] Arrival date & time: 12/15/21  1816     History  Chief Complaint  Patient presents with   Seizures    Dwayne Bolton is a 46 y.o. male.  He has a history of alcohol abuse and seizure disorder.  He is brought in by EMS after having a possible seizure.  He is complaining of left-sided facial and jaw pain.  His last seizure was about a week ago when he was seen also for facial trauma.  Denies any fevers chills nausea vomiting.  Denies drugs  The history is provided by the patient and the EMS personnel.  Seizures Seizure activity on arrival: no   Seizure type:  Unable to specify Initial focality:  Unable to specify Postictal symptoms: confusion and somnolence   Return to baseline: yes   Timing:  Once Number of seizures this episode:  1 Progression:  Resolved PTA treatment:  None History of seizures: yes       Home Medications Prior to Admission medications   Medication Sig Start Date End Date Taking? Authorizing Provider  levETIRAcetam (KEPPRA) 500 MG tablet Take 1 tablet (500 mg total) by mouth 2 (two) times daily. 08/22/21   Cameron Sprang, MD  lidocaine (LIDODERM) 5 % Place 1 patch onto the skin daily. Remove & Discard patch within 12 hours or as directed by MD Patient not taking: Reported on 08/22/2021 07/26/21   Regan Lemming, MD  LORazepam (ATIVAN) 1 MG tablet Take 1 tablet (1 mg total) by mouth daily as needed for anxiety or seizure. Patient not taking: Reported on 12/07/2021 08/22/21   Cameron Sprang, MD  thiamine 100 MG tablet Take 1 tablet (100 mg total) by mouth daily. Patient not taking: Reported on 12/07/2021 07/12/21   Rai, Vernelle Emerald, MD  vitamin C (ASCORBIC ACID) 500 MG tablet Take 500 mg by mouth daily. Patient not taking: Reported on 12/07/2021    [provider]      Allergies    Codeine, Hydrocodone-acetaminophen, and Percocet [oxycodone-acetaminophen]    Review of  Systems   Review of Systems  Constitutional:  Negative for fever.  HENT:  Positive for facial swelling.   Eyes:  Negative for visual disturbance.  Respiratory:  Negative for cough.   Cardiovascular:  Negative for chest pain.  Gastrointestinal:  Negative for abdominal pain.  Genitourinary:  Negative for dysuria.  Musculoskeletal:  Negative for neck pain.  Neurological:  Positive for seizures.   Physical Exam Updated Vital Signs BP (!) 142/96 (BP Location: Right Arm)    Pulse (!) 111    Temp 99.1 F (37.3 C) (Oral)    Resp 12    SpO2 93%  Physical Exam Vitals and nursing note reviewed.  Constitutional:      General: He is awake. He is not in acute distress.    Appearance: Normal appearance. He is well-developed.  HENT:     Head: Normocephalic.     Comments: He has some swelling and tenderness of his left periorbital and left cheek area.  There is a small amount of ecchymosis under his left eye.  No crepitus.  No malocclusion. Eyes:     Conjunctiva/sclera: Conjunctivae normal.  Cardiovascular:     Rate and Rhythm: Normal rate and regular rhythm.     Heart sounds: No murmur heard. Pulmonary:     Effort: Pulmonary effort is normal. No respiratory distress.     Breath sounds: Normal breath sounds.  Abdominal:     Palpations: Abdomen is soft.     Tenderness: There is no abdominal tenderness. There is no guarding or rebound.  Musculoskeletal:        General: No swelling or tenderness. Normal range of motion.     Cervical back: Neck supple.  Skin:    General: Skin is warm and dry.     Capillary Refill: Capillary refill takes less than 2 seconds.  Neurological:     General: No focal deficit present.     Sensory: No sensory deficit.     Motor: No weakness.  Psychiatric:        Mood and Affect: Mood normal.    ED Results / Procedures / Treatments   Labs (all labs ordered are listed, but only abnormal results are displayed) Labs Reviewed  BASIC METABOLIC PANEL - Abnormal;  Notable for the following components:      Result Value   Chloride 94 (*)    Glucose, Bld 125 (*)    Calcium 8.7 (*)    All other components within normal limits  ETHANOL - Abnormal; Notable for the following components:   Alcohol, Ethyl (B) 429 (*)    All other components within normal limits  CBC WITH DIFFERENTIAL/PLATELET - Abnormal; Notable for the following components:   Monocytes Absolute 1.2 (*)    All other components within normal limits    EKG EKG Interpretation  Date/Time:  Sunday December 15 2021 18:22:25 EST Ventricular Rate:  103 PR Interval:  154 QRS Duration: 84 QT Interval:  336 QTC Calculation: 440 R Axis:   -27 Text Interpretation: Sinus tachycardia Septal infarct , age undetermined Abnormal ECG When compared with ECG of 11-Feb-2021 12:42, increased rate Confirmed by Aletta Edouard [phone]) on 12/15/2021 6:30:25 PM  Radiology CT Head Wo Contrast  Result Date: 12/15/2021 CLINICAL DATA:  Head trauma, abnormal mental status (Age 28-64y); Facial trauma, blunt EXAM: CT HEAD WITHOUT CONTRAST CT MAXILLOFACIAL WITHOUT CONTRAST TECHNIQUE: Multidetector CT imaging of the head and maxillofacial structures were performed using the standard protocol without intravenous contrast. Multiplanar CT image reconstructions of the maxillofacial structures were also generated. RADIATION DOSE REDUCTION: This exam was performed according to the departmental dose-optimization program which includes automated exposure control, adjustment of the mA and/or kV according to patient size and/or use of iterative reconstruction technique. COMPARISON:  12/08/2021 FINDINGS: CT HEAD FINDINGS Brain: Ventricles and sulci are normal in size and configuration. No acute intracranial hemorrhage, mass effect, or edema. Gray-white differentiation is preserved. No extra-axial collection. Vascular: No hyperdense vessel or unexpected calcification. Skull: Unremarkable. Other: Mastoid air cells are clear. CT  MAXILLOFACIAL FINDINGS Osseous: No acute facial fracture. Orbits: No intraorbital hematoma. Sinuses: Patchy paranasal sinus mucosal thickening. Soft tissues: Residual/recurrent left facial soft tissue swelling. IMPRESSION: No evidence of acute intracranial injury.  No acute facial fracture. Electronically Signed   By: Macy Mis M.D.   On: 12/15/2021 19:26   CT Maxillofacial WO CM  Result Date: 12/15/2021 CLINICAL DATA:  Head trauma, abnormal mental status (Age 78-64y); Facial trauma, blunt EXAM: CT HEAD WITHOUT CONTRAST CT MAXILLOFACIAL WITHOUT CONTRAST TECHNIQUE: Multidetector CT imaging of the head and maxillofacial structures were performed using the standard protocol without intravenous contrast. Multiplanar CT image reconstructions of the maxillofacial structures were also generated. RADIATION DOSE REDUCTION: This exam was performed according to the departmental dose-optimization program which includes automated exposure control, adjustment of the mA and/or kV according to patient size and/or use of iterative reconstruction technique. COMPARISON:  12/08/2021 FINDINGS:  CT HEAD FINDINGS Brain: Ventricles and sulci are normal in size and configuration. No acute intracranial hemorrhage, mass effect, or edema. Gray-white differentiation is preserved. No extra-axial collection. Vascular: No hyperdense vessel or unexpected calcification. Skull: Unremarkable. Other: Mastoid air cells are clear. CT MAXILLOFACIAL FINDINGS Osseous: No acute facial fracture. Orbits: No intraorbital hematoma. Sinuses: Patchy paranasal sinus mucosal thickening. Soft tissues: Residual/recurrent left facial soft tissue swelling. IMPRESSION: No evidence of acute intracranial injury.  No acute facial fracture. Electronically Signed   By: Macy Mis M.D.   On: 12/15/2021 19:26    Procedures Procedures    Medications Ordered in ED Medications - No data to display  ED Course/ Medical Decision Making/ A&P Clinical Course as of  12/16/21 0951  Sun Dec 15, 2021  1955 CT head and max face interpreted by me as no acute fractures or bleed. [MB]  2247 Patient resting quietly.  He will likely need to be signed out and discharge when more sober. [MB]    Clinical Course User Index [MB] Hayden Rasmussen, MD                           Medical Decision Making Amount and/or Complexity of Data Reviewed Labs: ordered. Radiology: ordered.  Risk Prescription drug management.  This patient complains of possible seizure, left facial pain; this involves an extensive number of treatment Options and is a complaint that carries with it a high risk of complications and morbidity. The differential includes seizure, intoxication, fall, alcohol withdrawal, medication noncompliance  I ordered, reviewed and interpreted labs, which included CBC with normal white count normal hemoglobin, chemistries fairly unremarkable, alcohol markedly elevated I ordered medication IV fluids and reviewed PMP when indicated. I ordered imaging studies which included CT head and max face and I independently    visualized and interpreted imaging which showed no acute findings Previous records obtained and reviewed in epic, patient here last week for possible seizure and left facial contusion Cardiac monitoring reviewed, sinus tachycardia improving to normal sinus rhythm Social determinants considered, patient's ongoing alcohol abuse Critical Interventions: None  After the interventions stated above, I reevaluated the patient and found patient to be resting comfortably Admission and further testing considered, patient at this time does not require admission.  His care is signed out to oncoming provider Dr. Nicholes Stairs to reassess after metabolizing alcohol.  Likely can be discharged.          Final Clinical Impression(s) / ED Diagnoses Final diagnoses:  Seizure-like activity ([place])  Contusion of face, initial encounter  Alcoholic intoxication without  complication [facility])    Rx / [place] Orders ED Discharge Orders     None         Hayden Rasmussen, MD 12/16/21 [phone]

## 2021-12-16 NOTE — ED Notes (Signed)
Ambulated with steady gate to bathroom. Okay per MD to discharge. Pt given discharge paperwork at this time.

## 2021-12-24 ENCOUNTER — Encounter (HOSPITAL_COMMUNITY): Payer: Self-pay

## 2021-12-24 ENCOUNTER — Emergency Department (HOSPITAL_COMMUNITY)
Admission: EM | Admit: 2021-12-24 | Discharge: 2021-12-25 | Disposition: A | Discharge status: Home / Self Care | Payer: Self-pay | Attending: Emergency Medicine | Admitting: Emergency Medicine

## 2021-12-24 ENCOUNTER — Other Ambulatory Visit: Payer: Self-pay

## 2021-12-24 ENCOUNTER — Emergency Department (HOSPITAL_COMMUNITY)
Admission: EM | Admit: 2021-12-24 | Discharge: 2021-12-24 | Disposition: A | Discharge status: Home / Self Care | Payer: Self-pay | Attending: Emergency Medicine | Admitting: Emergency Medicine

## 2021-12-24 DIAGNOSIS — R569 Unspecified convulsions: Secondary | ICD-10-CM | POA: Insufficient documentation

## 2021-12-24 DIAGNOSIS — G40909 Epilepsy, unspecified, not intractable, without status epilepticus: Secondary | ICD-10-CM | POA: Insufficient documentation

## 2021-12-24 DIAGNOSIS — Y908 Blood alcohol level of 240 mg/100 ml or more: Secondary | ICD-10-CM | POA: Insufficient documentation

## 2021-12-24 DIAGNOSIS — F10920 Alcohol use, unspecified with intoxication, uncomplicated: Secondary | ICD-10-CM | POA: Insufficient documentation

## 2021-12-24 DIAGNOSIS — Y92524 Gas station as the place of occurrence of the external cause: Secondary | ICD-10-CM | POA: Insufficient documentation

## 2021-12-24 DIAGNOSIS — Y907 Blood alcohol level of 200-239 mg/100 ml: Secondary | ICD-10-CM | POA: Insufficient documentation

## 2021-12-24 DIAGNOSIS — F10129 Alcohol abuse with intoxication, unspecified: Secondary | ICD-10-CM | POA: Insufficient documentation

## 2021-12-24 DIAGNOSIS — F17210 Nicotine dependence, cigarettes, uncomplicated: Secondary | ICD-10-CM | POA: Insufficient documentation

## 2021-12-24 DIAGNOSIS — W19XXXA Unspecified fall, initial encounter: Secondary | ICD-10-CM | POA: Insufficient documentation

## 2021-12-24 DIAGNOSIS — R400 Somnolence: Secondary | ICD-10-CM | POA: Insufficient documentation

## 2021-12-24 DIAGNOSIS — F10929 Alcohol use, unspecified with intoxication, unspecified: Secondary | ICD-10-CM

## 2021-12-24 LAB — COMPREHENSIVE METABOLIC PANEL
ALT: 86 U/L — ABNORMAL HIGH (ref 0–44)
ALT: 96 U/L — ABNORMAL HIGH (ref 0–44)
AST: 129 U/L — ABNORMAL HIGH (ref 15–41)
AST: 171 U/L — ABNORMAL HIGH (ref 15–41)
Albumin: 3.8 g/dL (ref 3.5–5.0)
Albumin: 4.2 g/dL (ref 3.5–5.0)
Alkaline Phosphatase: 134 U/L — ABNORMAL HIGH (ref 38–126)
Alkaline Phosphatase: 127 U/L — ABNORMAL HIGH (ref 38–126)
Anion gap: 12 (ref 5–15)
Anion gap: 11 (ref 5–15)
BUN: <5 mg/dL — ABNORMAL LOW (ref 6–20)
BUN: 5 mg/dL — ABNORMAL LOW (ref 6–20)
CO2: 26 mmol/L (ref 22–32)
CO2: 29 mmol/L (ref 22–32)
Calcium: 8.4 mg/dL — ABNORMAL LOW (ref 8.9–10.3)
Calcium: 8.7 mg/dL — ABNORMAL LOW (ref 8.9–10.3)
Chloride: 101 mmol/L (ref 98–111)
Chloride: 101 mmol/L (ref 98–111)
Creatinine, Ser: 0.91 mg/dL (ref 0.61–1.24)
Creatinine, Ser: 0.87 mg/dL (ref 0.61–1.24)
GFR, Estimated: >60 mL/min (ref >60)
GFR, Estimated: >60 mL/min (ref >60)
Glucose, Bld: 95 mg/dL (ref 70–99)
Glucose, Bld: 105 mg/dL — ABNORMAL HIGH (ref 70–99)
Potassium: 4.1 mmol/L (ref 3.5–5.1)
Potassium: 3.9 mmol/L (ref 3.5–5.1)
Sodium: 139 mmol/L (ref 135–145)
Sodium: 141 mmol/L (ref 135–145)
Total Bilirubin: 0.5 mg/dL (ref 0.3–1.2)
Total Bilirubin: 0.3 mg/dL (ref 0.3–1.2)
Total Protein: 6.8 g/dL (ref 6.5–8.1)
Total Protein: 7.5 g/dL (ref 6.5–8.1)

## 2021-12-24 LAB — CBC WITH DIFFERENTIAL/PLATELET
Abs Immature Granulocytes: 0.03 10*3/uL (ref 0.00–0.07)
Basophils Absolute: 0.1 10*3/uL (ref 0.0–0.1)
Basophils Relative: 3 %
Eosinophils Absolute: 0.1 10*3/uL (ref 0.0–0.5)
Eosinophils Relative: 2 %
HCT: 39.7 % (ref 39.0–52.0)
Hemoglobin: 14.3 g/dL (ref 13.0–17.0)
Immature Granulocytes: 1 %
Lymphocytes Relative: 27 %
Lymphs Abs: 1.4 10*3/uL (ref 0.7–4.0)
MCH: 33.5 pg (ref 26.0–34.0)
MCHC: 36 g/dL (ref 30.0–36.0)
MCV: 93 fL (ref 80.0–100.0)
Monocytes Absolute: 0.8 10*3/uL (ref 0.1–1.0)
Monocytes Relative: 15 %
Neutro Abs: 2.7 10*3/uL (ref 1.7–7.7)
Neutrophils Relative %: 52 %
Platelets: 164 10*3/uL (ref 150–400)
RBC: 4.27 MIL/uL (ref 4.22–5.81)
RDW: 16.7 % — ABNORMAL HIGH (ref 11.5–15.5)
WBC: 5.2 10*3/uL (ref 4.0–10.5)
nRBC: 0 % (ref 0.0–0.2)

## 2021-12-24 LAB — CBC
HCT: 40.6 % (ref 39.0–52.0)
Hemoglobin: 14.6 g/dL (ref 13.0–17.0)
MCH: 33.3 pg (ref 26.0–34.0)
MCHC: 36 g/dL (ref 30.0–36.0)
MCV: 92.7 fL (ref 80.0–100.0)
Platelets: 169 10*3/uL (ref 150–400)
RBC: 4.38 MIL/uL (ref 4.22–5.81)
RDW: 16.6 % — ABNORMAL HIGH (ref 11.5–15.5)
WBC: 5.4 10*3/uL (ref 4.0–10.5)
nRBC: 0 % (ref 0.0–0.2)

## 2021-12-24 LAB — ETHANOL
Alcohol, Ethyl (B): 421 mg/dL (ref <10)
Alcohol, Ethyl (B): 418 mg/dL (ref <10)

## 2021-12-24 LAB — CBG MONITORING, ED
Glucose-Capillary: 95 mg/dL (ref 70–99)
Glucose-Capillary: 105 mg/dL — ABNORMAL HIGH (ref 70–99)

## 2021-12-24 LAB — MAGNESIUM: Magnesium: 2.2 mg/dL (ref 1.7–2.4)

## 2021-12-24 MED ORDER — LEVETIRACETAM IN NACL 1000 MG/100ML IV SOLN
1000.0000 mg | Freq: Once | INTRAVENOUS | Status: AC
  Administered 2021-12-24: 1000 mg via INTRAVENOUS
  Filled 2021-12-24: qty 100

## 2021-12-24 MED ORDER — THIAMINE HCL 100 MG/ML IJ SOLN
100.0000 mg | Freq: Once | INTRAMUSCULAR | Status: AC
  Administered 2021-12-24: 100 mg via INTRAVENOUS
  Filled 2021-12-24: qty 2

## 2021-12-24 NOTE — ED Provider Notes (Signed)
?[facility]-EMERGENCY DEPT ?Provider Note ? ? ?CSN: [id number] ?Arrival date & time: 12/24/21  2014 ? ?  ? ?History ? ?Chief Complaint  ?Patient presents with  ? Seizures  ? ? ?Maximus Hoffert is a 46 y.o. male. ? ?HPI ? ?  ? ?46 year old male comes in with chief complaint of seizure. ?Patient has history of seizure disorder and alcoholism.  Patient unable to give significant history -but it appears that he was brought into the ER by EMS after seizure.  Patient admits to heavy alcohol use. ? ? ?Home Medications ?Prior to Admission medications   ?Medication Sig Start Date End Date Taking? Authorizing Provider  ?ibuprofen (ADVIL) 200 MG tablet Take 400 mg by mouth every 6 (six) hours as needed for headache or moderate pain.    [provider]  ?levETIRAcetam (KEPPRA) 500 MG tablet Take 1 tablet (500 mg total) by mouth 2 (two) times daily. 08/22/21   Van Clines, MD  ?lidocaine (LIDODERM) 5 % Place 1 patch onto the skin daily. Remove & Discard patch within 12 hours or as directed by MD ?Patient not taking: Reported on 08/22/2021 07/26/21   Ernie Avena, MD  ?LORazepam (ATIVAN) 1 MG tablet Take 1 tablet (1 mg total) by mouth daily as needed for anxiety or seizure. ?Patient not taking: Reported on 12/07/2021 08/22/21   Van Clines, MD  ?thiamine 100 MG tablet Take 1 tablet (100 mg total) by mouth daily. ?Patient not taking: Reported on 12/07/2021 07/12/21   Cathren Harsh, MD  ?vitamin C (ASCORBIC ACID) 500 MG tablet Take 500 mg by mouth daily. ?Patient not taking: Reported on 12/07/2021    [provider]  ?   ? ?Allergies    ?Codeine, Hydrocodone-acetaminophen, and Percocet [oxycodone-acetaminophen]   ? ?Review of Systems   ?Review of Systems  ?Unable to perform ROS: Mental status change  ? ?Physical Exam ?Updated Vital Signs ?BP 109/77   Pulse 92   Temp 98.5 ?F (36.9 ?C) (Oral)   Resp 14   SpO2 95%  ?Physical Exam ?Vitals and nursing note reviewed.  ?Constitutional:   ?    Appearance: He is well-developed.  ?   Comments: Somnolent  ?HENT:  ?   Head: Atraumatic.  ?Cardiovascular:  ?   Rate and Rhythm: Normal rate.  ?Pulmonary:  ?   Effort: Pulmonary effort is normal.  ?Musculoskeletal:  ?   Cervical back: Neck supple.  ?Skin: ?   General: Skin is warm.  ?Neurological:  ?   General: No focal deficit present.  ? ? ?ED Results / Procedures / Treatments   ?Labs ?(all labs ordered are listed, but only abnormal results are displayed) ?Labs Reviewed  ?COMPREHENSIVE METABOLIC PANEL - Abnormal; Notable for the following components:  ?    Result Value  ? Glucose, Bld 105 (*)   ? BUN 5 (*)   ? Calcium 8.7 (*)   ? AST 171 (*)   ? ALT 96 (*)   ? Alkaline Phosphatase 127 (*)   ? All other components within normal limits  ?CBC WITH DIFFERENTIAL/PLATELET - Abnormal; Notable for the following components:  ? RDW 16.7 (*)   ? All other components within normal limits  ?ETHANOL - Abnormal; Notable for the following components:  ? Alcohol, Ethyl (B) 418 (*)   ? All other components within normal limits  ?CBG MONITORING, ED - Abnormal; Notable for the following components:  ? Glucose-Capillary 105 (*)   ? All other components within normal  limits  ?MAGNESIUM  ? ? ?EKG ?None ? ?Radiology ?No results found. ? ?Procedures ?Marland KitchenCritical Care ?Performed by: Derwood Kaplan, MD ?Authorized by: Derwood Kaplan, MD  ? ?Critical care provider statement:  ?  Critical care time (minutes):  32 ?  Critical care was time spent personally by me on the following activities:  Development of treatment plan with patient or surrogate, discussions with consultants, evaluation of patient's response to treatment, examination of patient, ordering and review of laboratory studies, ordering and review of radiographic studies, ordering and performing treatments and interventions, pulse oximetry, re-evaluation of patient's condition and review of old charts  ? ? ?Medications Ordered in ED ?Medications  ?levETIRAcetam (KEPPRA) IVPB 1000  mg/100 mL premix (0 mg Intravenous Stopped 12/24/21 2225)  ?thiamine (B-1) injection 100 mg (100 mg Intravenous Given 12/24/21 2154)  ? ? ?ED Course/ Medical Decision Making/ A&P ?Clinical Course as of 12/24/21 2343  ?Tue Dec 24, 2021  ?2342 Ethanol(!!) ?Patient's alcohol is significantly elevated.  Likely alcohol toxicity at this time.  Will need to stay overnight. [AN]  ?2342 AST(!): 171 ?Baseline elevation of LFT. [AN]  ?  ?Clinical Course User Index ?[AN] Derwood Kaplan, MD  ? ?                        ?Medical Decision Making ?Amount and/or Complexity of Data Reviewed ?Labs: ordered. ? ?Risk ?Prescription drug management. ? ? ?46 year old male comes in with chief complaint of seizure. ? ?He came here via EMS.  There is history of alcoholism, alcohol withdrawal seizure and seizure disorder -it seems like he is on keppra. ? ?Patient appears intoxicated.  He is noted to be moving all 4 extremity. ?No evidence of any blunt trauma to his head. ? ?Basic labs ordered. ? ? ?Final Clinical Impression(s) / ED Diagnoses ?Final diagnoses:  ?Alcoholic intoxication with complication (HCC)  ?Seizure (HCC)  ? ? ?Rx / DC Orders ?ED Discharge Orders   ? ? None  ? ?  ? ? ?  ?Derwood Kaplan, MD ?12/24/21 2343 ? ?

## 2021-12-24 NOTE — ED Triage Notes (Signed)
Pt arrived via [facility] for cc of seizure, possible fall from [employer] station. Pt has history of epilepsy, pt reported roughly one seizure a week. Pt was found sitting in chair with gas station employees and able to ambulate ambulance with assistance. Pt presenting as postictal, able to provide name, unable to provide date, time, information on event. No mouth trauma or urination noted. A&Ox1, [place] 13. ETOH +. ? ?EMS Vitals  ?BP 138/90 ?HR 100 ?98% RA ?RR 18 ?CBG 143 ?

## 2021-12-24 NOTE — ED Notes (Signed)
Swelling noted

## 2021-12-24 NOTE — ED Notes (Signed)
Pt awake, alert and oriented at this time. Pt offered food and drink and encouraged to eat and drink.  ?

## 2021-12-24 NOTE — Discharge Instructions (Signed)
You were evaluated in the Emergency Department and after careful evaluation, we did not find any emergent condition requiring admission or further testing in the hospital.  Your exam/testing today was overall reassuring.  Please return to the Emergency Department if you experience any worsening of your condition.  Thank you for allowing us to be a part of your care.  

## 2021-12-24 NOTE — ED Triage Notes (Signed)
Patient BIB EMS with c/o seizure tonight. Pt does not recall when seizure occurred. After he walked to cookout drank water, took an unknown pill and called EMS. EMS reports pt took keppra around 8pm. ETOH, A&O x3, 20 g LAC  ?

## 2021-12-24 NOTE — Discharge Instructions (Addendum)
Follow-up with your primary doctor in the next week. ? ?Continue home medications as previously prescribed. ?

## 2021-12-24 NOTE — ED Provider Notes (Signed)
?[place] DEPT ?[facility] Emergency Department ?Provider Note ?MRN:  [id number]  ?Arrival date & time: 12/24/21    ? ?Chief Complaint   ?Seizures and Fall ?  ?History of Present Illness   ?Elway Mccullum is a 46 y.o. year-old male with a history of seizure disorder, alcohol use disorder presenting to the ED with chief complaint of seizure and fall. ? ?Patient found with altered mental status at a gas station.  Suspected seizure but unwitnessed.  Patient has also been drinking. ? ?Review of Systems  ?I was unable to obtain a full/accurate HPI, PMH, or ROS due to the patient's altered mental status. ? ?Patient's Health History   ? ?Past Medical History:  ?Diagnosis Date  ? Alcohol abuse   ? Seizure ([place])   ?  ?No past surgical history on file.  ?Family History  ?Problem Relation Age of Onset  ? Diabetes Mother   ? Hypertension Mother   ?  ?Social History  ? ?Socioeconomic History  ? Marital status: Legally Separated  ?  Spouse name: Not on file  ? Number of children: Not on file  ? Years of education: Not on file  ? Highest education level: Not on file  ?Occupational History  ? Not on file  ?Tobacco Use  ? Smoking status: Every Day  ?  Packs/day: 0.25  ?  Years: 4.00  ?  Pack years: 1.00  ?  Types: Cigarettes  ? Smokeless tobacco: Former  ?Vaping Use  ? Vaping Use: Never used  ?Substance and Sexual Activity  ? Alcohol use: Yes  ? Drug use: No  ? Sexual activity: Not Currently  ?Other Topics Concern  ? Not on file  ?Social History Narrative  ? Right handed   ? ?Social Determinants of Health  ? ?Financial Resource Strain: Not on file  ?Food Insecurity: Not on file  ?Transportation Needs: Not on file  ?Physical Activity: Not on file  ?Stress: Not on file  ?Social Connections: Not on file  ?Intimate Partner Violence: Not on file  ?  ? ?Physical Exam  ? ?Vitals:  ? 12/24/21 0515 12/24/21 [id number]  ?BP: 106/73 112/73  ?Pulse: 91 89  ?Resp: 13 14  ?Temp:    ?SpO2: 97% 97%  ?  ?CONSTITUTIONAL: Chronically  ill-appearing, NAD ?NEURO/PSYCH:  Alert and oriented to name only, moves all extremities, follows commands ?EYES:  eyes equal and reactive ?ENT/NECK:  no LAD, no JVD ?CARDIO: Regular rate, well-perfused, normal S1 and S2 ?PULM:  CTAB no wheezing or rhonchi ?GI/GU:  non-distended, non-tender ?MSK/SPINE:  No gross deformities, no edema ?SKIN:  no rash ? ? ?*Additional and/or pertinent findings included in MDM below ? ?Diagnostic and Interventional Summary  ? ? EKG Interpretation ? ?Date/Time:  Tuesday December 24 2021 01:09:10 EST ?Ventricular Rate:  84 ?PR Interval:  173 ?QRS Duration: 87 ?QT Interval:  363 ?QTC Calculation: 430 ?R Axis:   31 ?Text Interpretation: Sinus rhythm ST elev, probable normal early repol pattern Confirmed by Gerlene Fee [phone]) on 12/24/2021 1:33:10 AM ?  ? ?  ? ?Labs Reviewed  ?CBC - Abnormal; Notable for the following components:  ?    Result Value  ? RDW 16.6 (*)   ? All other components within normal limits  ?COMPREHENSIVE METABOLIC PANEL - Abnormal; Notable for the following components:  ? BUN <5 (*)   ? Calcium 8.4 (*)   ? AST 129 (*)   ? ALT 86 (*)   ? Alkaline Phosphatase 134 (*)   ?  All other components within normal limits  ?ETHANOL - Abnormal; Notable for the following components:  ? Alcohol, Ethyl (B) 421 (*)   ? All other components within normal limits  ?CBG MONITORING, ED  ?  ?No orders to display  ?  ?Medications - No data to display  ? ?Procedures  /  Critical Care ?Procedures ? ?ED Course and Medical Decision Making  ?Initial Impression and Ddx ?Suspect alcohol intoxication, also considering postictal state.  Patient has minimal evidence of trauma, per chart review and per patient this is from a fall a few weeks ago.  No complaints at this time, somnolent, falls asleep during questioning.  Will monitor closely, obtain screening labs. ? ?Past medical/surgical history that increases complexity of ED encounter: Seizure disorder ? ?Interpretation of Diagnostics ?I personally  reviewed the EKG and my interpretation is as follows: Sinus rhythm ?   ?Labs reveal no significant blood count or electrolyte disturbance.  Ethanol level is markedly elevated. ? ?Patient Reassessment and Ultimate Disposition/Management ?Patient seems to be improving in terms of sobriety, plan is for discharge. ? ?Patient management required discussion with the following services or consulting groups:  None ? ?Complexity of Problems Addressed ?Acute illness or injury that poses threat of life of bodily function ? ?Additional Data Reviewed and Analyzed ?Further history obtained from: ?EMS on arrival ? ?Additional Factors Impacting ED Encounter Risk ?None ? ?Barth Kirks. Sedonia Small, MD ?[facility] Emergency Medicine ?[place] ?[email or web address] ? ?Final Clinical Impressions(s) / ED Diagnoses  ? ?  ICD-10-CM   ?1. Alcoholic intoxication without complication ([place])  0000000   ?  ?  ?ED Discharge Orders   ? ? None  ? ?  ?  ? ?Discharge Instructions Discussed with and Provided to Patient:  ? ?Discharge Instructions   ?None ?  ? ?  ?Maudie Flakes, MD ?12/24/21 [phone] ? ?

## 2021-12-25 ENCOUNTER — Emergency Department (HOSPITAL_COMMUNITY)
Admission: EM | Admit: 2021-12-25 | Discharge: 2021-12-26 | Disposition: A | Discharge status: Home / Self Care | Payer: Self-pay | Attending: Emergency Medicine | Admitting: Emergency Medicine

## 2021-12-25 ENCOUNTER — Other Ambulatory Visit: Payer: Self-pay

## 2021-12-25 ENCOUNTER — Encounter (HOSPITAL_COMMUNITY): Payer: Self-pay | Admitting: Oncology

## 2021-12-25 ENCOUNTER — Emergency Department (HOSPITAL_COMMUNITY): Payer: Self-pay

## 2021-12-25 DIAGNOSIS — R569 Unspecified convulsions: Secondary | ICD-10-CM | POA: Insufficient documentation

## 2021-12-25 DIAGNOSIS — Y908 Blood alcohol level of 240 mg/100 ml or more: Secondary | ICD-10-CM | POA: Insufficient documentation

## 2021-12-25 DIAGNOSIS — F10920 Alcohol use, unspecified with intoxication, uncomplicated: Secondary | ICD-10-CM | POA: Insufficient documentation

## 2021-12-25 LAB — CBC WITH DIFFERENTIAL/PLATELET
Abs Immature Granulocytes: 0.02 10*3/uL (ref 0.00–0.07)
Basophils Absolute: 0.1 10*3/uL (ref 0.0–0.1)
Basophils Relative: 2 %
Eosinophils Absolute: 0.1 10*3/uL (ref 0.0–0.5)
Eosinophils Relative: 1 %
HCT: 45.8 % (ref 39.0–52.0)
Hemoglobin: 16 g/dL (ref 13.0–17.0)
Immature Granulocytes: 0 %
Lymphocytes Relative: 30 %
Lymphs Abs: 1.7 10*3/uL (ref 0.7–4.0)
MCH: 32.8 pg (ref 26.0–34.0)
MCHC: 34.9 g/dL (ref 30.0–36.0)
MCV: 93.9 fL (ref 80.0–100.0)
Monocytes Absolute: 0.8 10*3/uL (ref 0.1–1.0)
Monocytes Relative: 14 %
Neutro Abs: 2.9 10*3/uL (ref 1.7–7.7)
Neutrophils Relative %: 53 %
Platelets: 189 10*3/uL (ref 150–400)
RBC: 4.88 MIL/uL (ref 4.22–5.81)
RDW: 16.6 % — ABNORMAL HIGH (ref 11.5–15.5)
WBC: 5.6 10*3/uL (ref 4.0–10.5)
nRBC: 0 % (ref 0.0–0.2)

## 2021-12-25 LAB — BASIC METABOLIC PANEL
Anion gap: 9 (ref 5–15)
BUN: 6 mg/dL (ref 6–20)
CO2: 31 mmol/L (ref 22–32)
Calcium: 9.2 mg/dL (ref 8.9–10.3)
Chloride: 102 mmol/L (ref 98–111)
Creatinine, Ser: 0.95 mg/dL (ref 0.61–1.24)
GFR, Estimated: >60 mL/min (ref >60)
Glucose, Bld: 96 mg/dL (ref 70–99)
Potassium: 4.1 mmol/L (ref 3.5–5.1)
Sodium: 142 mmol/L (ref 135–145)

## 2021-12-25 LAB — ETHANOL: Alcohol, Ethyl (B): 450 mg/dL (ref <10)

## 2021-12-25 LAB — CK: Total CK: 1064 U/L — ABNORMAL HIGH (ref 49–397)

## 2021-12-25 MED ORDER — LACTATED RINGERS IV BOLUS
2000.0000 mL | Freq: Once | INTRAVENOUS | Status: AC
  Administered 2021-12-25: 2000 mL via INTRAVENOUS

## 2021-12-25 MED ORDER — THIAMINE HCL 100 MG/ML IJ SOLN
100.0000 mg | Freq: Once | INTRAMUSCULAR | Status: AC
  Administered 2021-12-25: 100 mg via INTRAVENOUS
  Filled 2021-12-25: qty 2

## 2021-12-25 MED ORDER — LEVETIRACETAM 500 MG PO TABS
500.0000 mg | ORAL_TABLET | Freq: Two times a day (BID) | ORAL | Status: DC
  Administered 2021-12-25: 500 mg via ORAL
  Filled 2021-12-25: qty 1

## 2021-12-25 NOTE — ED Notes (Signed)
Patient sleeping, blankets pulled over his head.  Respirations even and unlabored, patient in NAD at this time.  ?

## 2021-12-25 NOTE — ED Notes (Signed)
Repeat CK collected late d/t fluids not finished infusing.  ?

## 2021-12-25 NOTE — ED Provider Notes (Signed)
?  Physical Exam  ?BP 113/74   Pulse 80   Temp 98.5 ?F (36.9 ?C) (Oral)   Resp 13   SpO2 95%  ? ?Physical Exam ?Vitals and nursing note reviewed.  ?Constitutional:   ?   General: He is not in acute distress. ?   Appearance: Normal appearance. He is not ill-appearing.  ?HENT:  ?   Head: Normocephalic and atraumatic.  ?Pulmonary:  ?   Effort: Pulmonary effort is normal.  ?Skin: ?   General: Skin is warm and dry.  ?Neurological:  ?   General: No focal deficit present.  ?   Mental Status: He is alert and oriented to person, place, and time.  ? ? ?Procedures  ?Procedures ? ?ED Course / MDM  ? ?Clinical Course as of 12/25/21 0650  ?Tue Dec 24, 2021  ?2342 Ethanol(!!) ?Patient's alcohol is significantly elevated.  Likely alcohol toxicity at this time.  Will need to stay overnight. [AN]  ?2342 AST(!): 171 ?Baseline elevation of LFT. [AN]  ?  ?Clinical Course User Index ?[AN] Derwood Kaplan, MD  ? ?Care assumed from Dr. Rhunette Croft at shift change.  Patient brought here for possible seizure-like activity.  Patient apparently consuming alcohol heavily tonight.  Blood alcohol is 418.  Patient is very somnolent and difficult to arouse initially, but now is awake, alert, has eaten, and has been ambulatory in the department.  I feel as though he can safely be discharged at this time. ? ? ? ? ?  ?Geoffery Lyons, MD ?12/25/21 [phone] ? ?

## 2021-12-25 NOTE — ED Provider Triage Note (Addendum)
Emergency Medicine Provider Triage Evaluation Note ? ?Dwayne Bolton , a 46 y.o. male  was evaluated in triage.  Pt complains of Ultiva complaints.  Seen here yesterday for EtOH intoxication.  He has known history of seizures on Keppra.  Patient states he hurts everywhere.  He states he thinks he had a seizure earlier today.  States he fell to the ground hit his head.  He denies any numbness, weakness. Admits to etoh use ? ?Review of Systems  ?Positive: Myalgias, seizure-like activity ?Negative: Chest Pain, shortness of breath, numbness or weakness ? ?Physical Exam  ?BP (!) 132/93   Pulse 87   Temp 98.7 ?F (37.1 ?C) (Oral)   Resp 18   SpO2 99%  ?Gen:   Awake, no distress   ?Resp:  Normal effort  ?MSK:   Moves extremities without difficulty  ?Other:   ? ?Medical Decision Making  ?Medically screening exam initiated at 6:46 PM.  Appropriate orders placed.  Dwayne Bolton was informed that the remainder of the evaluation will be completed by another provider, this initial triage assessment does not replace that evaluation, and the importance of remaining in the ED until their evaluation is complete. ? ?Seizure Like activity, head injury, myalgias ? ? ?  ?Maricela Kawahara A, PA-C ?12/25/21 1848 ? ?

## 2021-12-25 NOTE — ED Provider Notes (Signed)
?[facility]-EMERGENCY DEPT ?Provider Note ? ? ?CSN: [id number] ?Arrival date & time: 12/25/21  1827 ? ?  ? ?History ? ?Chief Complaint  ?Patient presents with  ? Generalized Body Aches  ? ? ?Dwayne Bolton is a 46 y.o. male. ? ?HPI ?Patient presents for intoxication.  He [place] and [place] was [place].  He endorses generalized body aches.  He also has concern of recent seizure.  He was seen in the emergency department last night for alcohol intoxication.  He was discharged earlier this morning.  He reports continued alcohol use since discharge. ?  ? ?Home Medications ?Prior to Admission medications   ?Medication Sig Start Date End Date Taking? Authorizing Provider  ?ibuprofen (ADVIL) 200 MG tablet Take 400 mg by mouth every 6 (six) hours as needed for headache or moderate pain. ?Patient not taking: Reported on 12/25/2021    [provider]  ?levETIRAcetam (KEPPRA) 500 MG tablet Take 1 tablet (500 mg total) by mouth 2 (two) times daily. 08/22/21   Van Clines, MD  ?LORazepam (ATIVAN) 1 MG tablet Take 1 tablet (1 mg total) by mouth daily as needed for anxiety or seizure. ?Patient not taking: Reported on 12/25/2021 08/22/21   Van Clines, MD  ?   ? ?Allergies    ?Codeine, Hydrocodone-acetaminophen, and Percocet [oxycodone-acetaminophen]   ? ?Review of Systems   ?Review of Systems  ?Musculoskeletal:  Positive for myalgias.  ?All other systems reviewed and are negative. ? ?Physical Exam ?Updated Vital Signs ?BP 106/76 (BP Location: Left Arm)   Pulse 88   Temp 98.7 ?F (37.1 ?C) (Oral)   Resp 16   Ht 5\' 11"  (1.803 m)   Wt 84 kg   SpO2 96%   BMI 25.83 kg/m?  ?Physical Exam ?Vitals and nursing note reviewed.  ?Constitutional:   ?   General: He is not in acute distress. ?   Appearance: He is well-developed and normal weight. He is not toxic-appearing or diaphoretic.  ?HENT:  ?   Head: Normocephalic and atraumatic.  ?   Right Ear: External ear  normal.  ?   Left Ear: External ear normal.  ?   Nose: Nose normal.  ?   Mouth/Throat:  ?   Mouth: Mucous membranes are moist.  ?   Pharynx: Oropharynx is clear.  ?Eyes:  ?   Extraocular Movements: Extraocular movements intact.  ?   Conjunctiva/sclera: Conjunctivae normal.  ?Cardiovascular:  ?   Rate and Rhythm: Normal rate and regular rhythm.  ?   Heart sounds: No murmur heard. ?Pulmonary:  ?   Effort: Pulmonary effort is normal. No respiratory distress.  ?   Breath sounds: Normal breath sounds. No wheezing or rales.  ?Abdominal:  ?   Palpations: Abdomen is soft.  ?   Tenderness: There is no abdominal tenderness.  ?Musculoskeletal:     ?   General: No swelling or deformity.  ?   Cervical back: Normal range of motion and neck supple.  ?Skin: ?   General: Skin is warm and dry.  ?   Capillary Refill: Capillary refill takes less than 2 seconds.  ?   Coloration: Skin is not jaundiced or pale.  ?Neurological:  ?   General: No focal deficit present.  ?   Mental Status: He is alert.  ?Psychiatric:     ?   Mood and Affect: Mood normal.     ?   Speech: Speech is slurred.     ?  Behavior: Behavior is uncooperative, slowed and withdrawn.  ? ? ?ED Results / Procedures / Treatments   ?Labs ?(all labs ordered are listed, but only abnormal results are displayed) ?Labs Reviewed  ?CBC WITH DIFFERENTIAL/PLATELET - Abnormal; Notable for the following components:  ?    Result Value  ? RDW 16.6 (*)   ? All other components within normal limits  ?ETHANOL - Abnormal; Notable for the following components:  ? Alcohol, Ethyl (B) 450 (*)   ? All other components within normal limits  ?CK - Abnormal; Notable for the following components:  ? Total CK 1,064 (*)   ? All other components within normal limits  ?CK - Abnormal; Notable for the following components:  ? Total CK 779 (*)   ? All other components within normal limits  ?BASIC METABOLIC PANEL  ?LEVETIRACETAM LEVEL  ? ? ?EKG ?None ? ?Radiology ?CT HEAD WO CONTRAST ( ) ? ?Result Date:  12/25/2021 ?CLINICAL DATA:  Possible recent seizure activity, initial encounter EXAM: CT HEAD WITHOUT CONTRAST TECHNIQUE: Contiguous axial images were obtained from the base of the skull through the vertex without intravenous contrast. RADIATION DOSE REDUCTION: This exam was performed according to the departmental dose-optimization program which includes automated exposure control, adjustment of the mA and/or kV according to patient size and/or use of iterative reconstruction technique. COMPARISON:  12/15/2021 FINDINGS: Brain: No evidence of acute infarction, hemorrhage, hydrocephalus, extra-axial collection or mass lesion/mass effect. Vascular: No hyperdense vessel or unexpected calcification. Skull: Normal. Negative for fracture or focal lesion. Sinuses/Orbits: No acute finding. Other: None. IMPRESSION: No acute intracranial abnormality noted. Electronically Signed   By: Alcide Clever M.D.   On: 12/25/2021 19:53   ? ?Procedures ?Procedures  ? ? ?Medications Ordered in ED ?Medications  ?lactated ringers bolus 2,000 mL (0 mLs Intravenous Stopped 12/25/21 2352)  ?thiamine (B-1) injection 100 mg (100 mg Intravenous Given 12/25/21 2119)  ? ? ?ED Course/ Medical Decision Making/ A&P ?  ?                        ?Medical Decision Making ?Amount and/or Complexity of Data Reviewed ?Labs: ordered. ? ?Risk ?Prescription drug management. ? ? ?Patient is a 46 year old male who presents for intoxication.  He has been seen in the ED multiple times over the past several days with a similar presentation.  He was actually just discharged from the emergency department this morning and overnight observation.  This evening, he was reportedly sitting on the floor and had STD and EMS was called.  On arrival in the ED, patient does appear to be intoxicated.  Alcohol level was 450, higher than it was during his presentation previous night..  He endorses generalized body aches.  CK level was checked and found to be mildly elevated.  Repeat CK  level shows downtrend.  He was given IV fluids and home Keppra dose.  He will require time to metabolize his alcohol prior to reassessment and likely discharge.  Care of patient was signed out to oncoming ED provider. ? ? ? ? ? ? ? ?Final Clinical Impression(s) / ED Diagnoses ?Final diagnoses:  ?Alcoholic intoxication without complication (HCC)  ? ? ?Rx / DC Orders ?ED Discharge Orders   ? ? None  ? ?  ? ? ?  ?Gloris Manchester, MD ?12/27/21 1610 ? ?

## 2021-12-25 NOTE — ED Notes (Signed)
Patient given turkey sandwich and ginger ale.

## 2021-12-25 NOTE — ED Triage Notes (Signed)
Pt bib [facility] from [employer] d/t generalized pain.  He has non specific c/o.  Pt also reporting to EMS that he had a seizure at some point today.  Pt w/ known hx of seizure activity on Keppra.  Pt endorsed ETOH intake today as well.  ?

## 2021-12-26 LAB — LEVETIRACETAM LEVEL: Levetiracetam Lvl: 24.4 ug/mL (ref 10.0–40.0)

## 2021-12-26 LAB — CK: Total CK: 779 U/L — ABNORMAL HIGH (ref 49–397)

## 2021-12-26 NOTE — ED Notes (Signed)
Patient sleeping, respirations even and unlabored.  Patient in NAD at this time.  ?

## 2021-12-26 NOTE — ED Notes (Signed)
Patient ambulated to the restroom with a steady gait.

## 2021-12-27 NOTE — ED Provider Notes (Signed)
MTF, tolerating PO, and ambulating w/o issue. ? ? ?The patient appears reasonably screened and/or stabilized for discharge and I doubt any other medical condition or other [facility] requiring further screening, evaluation, or treatment in the ED at this time prior to discharge. Safe for discharge with strict return precautions. ? ?Disposition: Discharge ? ?Condition: Good ? ?I have discussed the results, Dx and Tx plan with the patient/family who expressed understanding and agree(s) with the plan. Discharge instructions discussed at length. The patient/family was given strict return precautions who verbalized understanding of the instructions. No further questions at time of discharge.  ? ? ?ED Discharge Orders   ? ? None  ? ?  ? ? ? ?Follow Up: ?Grayce Sessions, NP ?[address] ?[place] [address] ?[phone] ? ?Call  ?to schedule an appointment for close follow up ? ? ? ?  ?Nira Conn, MD ?12/27/21 7654 ? ?

## 2021-12-29 ENCOUNTER — Emergency Department (HOSPITAL_COMMUNITY)
Admission: EM | Admit: 2021-12-29 | Discharge: 2021-12-30 | Disposition: A | Discharge status: Home / Self Care | Payer: Self-pay | Attending: Emergency Medicine | Admitting: Emergency Medicine

## 2021-12-29 ENCOUNTER — Other Ambulatory Visit: Payer: Self-pay

## 2021-12-29 DIAGNOSIS — F10920 Alcohol use, unspecified with intoxication, uncomplicated: Secondary | ICD-10-CM

## 2021-12-29 DIAGNOSIS — G40909 Epilepsy, unspecified, not intractable, without status epilepticus: Secondary | ICD-10-CM | POA: Insufficient documentation

## 2021-12-29 DIAGNOSIS — Y908 Blood alcohol level of 240 mg/100 ml or more: Secondary | ICD-10-CM | POA: Insufficient documentation

## 2021-12-29 DIAGNOSIS — Z9114 Patient's other noncompliance with medication regimen: Secondary | ICD-10-CM | POA: Insufficient documentation

## 2021-12-29 DIAGNOSIS — F10129 Alcohol abuse with intoxication, unspecified: Secondary | ICD-10-CM | POA: Insufficient documentation

## 2021-12-29 DIAGNOSIS — Z5900 Homelessness unspecified: Secondary | ICD-10-CM | POA: Insufficient documentation

## 2021-12-29 LAB — CBG MONITORING, ED: Glucose-Capillary: 106 mg/dL — ABNORMAL HIGH (ref 70–99)

## 2021-12-29 MED ORDER — LEVETIRACETAM 500 MG PO TABS
1000.0000 mg | ORAL_TABLET | Freq: Once | ORAL | Status: AC
  Administered 2021-12-30: 1000 mg via ORAL
  Filled 2021-12-29: qty 2

## 2021-12-29 NOTE — ED Triage Notes (Signed)
Pt BIB [facility] from [place] stating that he had 2 seizures yesterday. Pt states he had convulsions. Hx of seizures and has not taken his keppra medication x3 days due to inability to afford medication. Endorces daily ETOH intake, last drink yesterday.  ?

## 2021-12-30 ENCOUNTER — Emergency Department (HOSPITAL_COMMUNITY): Payer: Self-pay

## 2021-12-30 ENCOUNTER — Emergency Department (HOSPITAL_COMMUNITY)
Admission: EM | Admit: 2021-12-30 | Discharge: 2021-12-31 | Disposition: A | Discharge status: Home / Self Care | Payer: Self-pay | Attending: Emergency Medicine | Admitting: Emergency Medicine

## 2021-12-30 DIAGNOSIS — R4182 Altered mental status, unspecified: Secondary | ICD-10-CM | POA: Insufficient documentation

## 2021-12-30 DIAGNOSIS — F10920 Alcohol use, unspecified with intoxication, uncomplicated: Secondary | ICD-10-CM | POA: Insufficient documentation

## 2021-12-30 DIAGNOSIS — M545 Low back pain, unspecified: Secondary | ICD-10-CM | POA: Insufficient documentation

## 2021-12-30 DIAGNOSIS — R569 Unspecified convulsions: Secondary | ICD-10-CM | POA: Insufficient documentation

## 2021-12-30 DIAGNOSIS — M546 Pain in thoracic spine: Secondary | ICD-10-CM | POA: Insufficient documentation

## 2021-12-30 DIAGNOSIS — Z20822 Contact with and (suspected) exposure to covid-19: Secondary | ICD-10-CM | POA: Insufficient documentation

## 2021-12-30 LAB — RAPID URINE DRUG SCREEN, HOSP PERFORMED
Amphetamines: NOT DETECTED
Barbiturates: NOT DETECTED
Benzodiazepines: NOT DETECTED
Cocaine: NOT DETECTED
Opiates: NOT DETECTED
Tetrahydrocannabinol: NOT DETECTED

## 2021-12-30 LAB — URINALYSIS, ROUTINE W REFLEX MICROSCOPIC
Bacteria, UA: NONE SEEN
Bilirubin Urine: NEGATIVE
Glucose, UA: NEGATIVE mg/dL
Ketones, ur: NEGATIVE mg/dL
Leukocytes,Ua: NEGATIVE
Nitrite: NEGATIVE
Protein, ur: NEGATIVE mg/dL
Specific Gravity, Urine: 1.003 — ABNORMAL LOW (ref 1.005–1.030)
pH: 6 (ref 5.0–8.0)

## 2021-12-30 LAB — COMPREHENSIVE METABOLIC PANEL
ALT: 99 U/L — ABNORMAL HIGH (ref 0–44)
AST: 250 U/L — ABNORMAL HIGH (ref 15–41)
Albumin: 4.3 g/dL (ref 3.5–5.0)
Alkaline Phosphatase: 146 U/L — ABNORMAL HIGH (ref 38–126)
Anion gap: 15 (ref 5–15)
BUN: 8 mg/dL (ref 6–20)
CO2: 22 mmol/L (ref 22–32)
Calcium: 9 mg/dL (ref 8.9–10.3)
Chloride: 100 mmol/L (ref 98–111)
Creatinine, Ser: 0.83 mg/dL (ref 0.61–1.24)
GFR, Estimated: >60 mL/min (ref >60)
Glucose, Bld: 91 mg/dL (ref 70–99)
Potassium: 4.1 mmol/L (ref 3.5–5.1)
Sodium: 137 mmol/L (ref 135–145)
Total Bilirubin: 1 mg/dL (ref 0.3–1.2)
Total Protein: 7.8 g/dL (ref 6.5–8.1)

## 2021-12-30 LAB — BASIC METABOLIC PANEL
Anion gap: 13 (ref 5–15)
BUN: 6 mg/dL (ref 6–20)
CO2: 28 mmol/L (ref 22–32)
Calcium: 9 mg/dL (ref 8.9–10.3)
Chloride: 98 mmol/L (ref 98–111)
Creatinine, Ser: 0.58 mg/dL — ABNORMAL LOW (ref 0.61–1.24)
GFR, Estimated: >60 mL/min (ref >60)
Glucose, Bld: 102 mg/dL — ABNORMAL HIGH (ref 70–99)
Potassium: 3.8 mmol/L (ref 3.5–5.1)
Sodium: 139 mmol/L (ref 135–145)

## 2021-12-30 LAB — ETHANOL
Alcohol, Ethyl (B): 463 mg/dL (ref <10)
Alcohol, Ethyl (B): 423 mg/dL (ref <10)

## 2021-12-30 LAB — CBC WITH DIFFERENTIAL/PLATELET
Abs Immature Granulocytes: 0.02 10*3/uL (ref 0.00–0.07)
Basophils Absolute: 0.1 10*3/uL (ref 0.0–0.1)
Basophils Relative: 2 %
Eosinophils Absolute: 0 10*3/uL (ref 0.0–0.5)
Eosinophils Relative: 0 %
HCT: 41.1 % (ref 39.0–52.0)
Hemoglobin: 14.5 g/dL (ref 13.0–17.0)
Immature Granulocytes: 0 %
Lymphocytes Relative: 18 %
Lymphs Abs: 1.3 10*3/uL (ref 0.7–4.0)
MCH: 33.4 pg (ref 26.0–34.0)
MCHC: 35.3 g/dL (ref 30.0–36.0)
MCV: 94.7 fL (ref 80.0–100.0)
Monocytes Absolute: 1 10*3/uL (ref 0.1–1.0)
Monocytes Relative: 13 %
Neutro Abs: 4.7 10*3/uL (ref 1.7–7.7)
Neutrophils Relative %: 67 %
Platelets: 142 10*3/uL — ABNORMAL LOW (ref 150–400)
RBC: 4.34 MIL/uL (ref 4.22–5.81)
RDW: 16.8 % — ABNORMAL HIGH (ref 11.5–15.5)
WBC: 7.1 10*3/uL (ref 4.0–10.5)
nRBC: 0 % (ref 0.0–0.2)

## 2021-12-30 LAB — CBC
HCT: 43.8 % (ref 39.0–52.0)
Hemoglobin: 15.7 g/dL (ref 13.0–17.0)
MCH: 33.3 pg (ref 26.0–34.0)
MCHC: 35.8 g/dL (ref 30.0–36.0)
MCV: 92.8 fL (ref 80.0–100.0)
Platelets: 150 10*3/uL (ref 150–400)
RBC: 4.72 MIL/uL (ref 4.22–5.81)
RDW: 16.7 % — ABNORMAL HIGH (ref 11.5–15.5)
WBC: 6.6 10*3/uL (ref 4.0–10.5)
nRBC: 0 % (ref 0.0–0.2)

## 2021-12-30 LAB — CBG MONITORING, ED: Glucose-Capillary: 102 mg/dL — ABNORMAL HIGH (ref 70–99)

## 2021-12-30 LAB — RESP PANEL BY RT-PCR (FLU A&B, COVID) ARPGX2
Influenza A by PCR: NEGATIVE
Influenza B by PCR: NEGATIVE
SARS Coronavirus 2 by RT PCR: NEGATIVE

## 2021-12-30 LAB — AMMONIA: Ammonia: 41 umol/L — ABNORMAL HIGH (ref 9–35)

## 2021-12-30 LAB — TSH: TSH: 1.705 u[IU]/mL (ref 0.350–4.500)

## 2021-12-30 MED ORDER — SODIUM CHLORIDE 0.9 % IV BOLUS
1000.0000 mL | Freq: Once | INTRAVENOUS | Status: AC
  Administered 2021-12-30: 1000 mL via INTRAVENOUS

## 2021-12-30 MED ORDER — LEVETIRACETAM IN NACL 1000 MG/100ML IV SOLN
1000.0000 mg | Freq: Once | INTRAVENOUS | Status: AC
  Administered 2021-12-30: 1000 mg via INTRAVENOUS
  Filled 2021-12-30: qty 100

## 2021-12-30 NOTE — ED Provider Notes (Signed)
?[facility]-EMERGENCY DEPT ?Provider Note ? ? ?CSN: [id number] ?Arrival date & time: 12/29/21  2334 ? ?  ? ?History ? ?Chief Complaint  ?Patient presents with  ? Seizure  ? ? ?Dwayne Bolton is a 46 y.o. male. ? ?Patient with history of seizure disorder prescribed Keppra 500 mg twice daily, alcohol use disorder, homelessness --presents to the emergency department today by EMS for evaluation of seizure.  Patient states that he had 2 seizures earlier today.  He states like it felt like he was going to bite his tongue and then he went out.  He felt generally weak during this afternoon.  He states that he came to the emergency department tonight because he was scared.  States that he has not had Keppra in 3 to 4 days because he cannot afford it.  Currently reports a knot on the left side of his head where he fell and back pain.  ? ? ?  ? ?Home Medications ?Prior to Admission medications   ?Medication Sig Start Date End Date Taking? Authorizing Provider  ?levETIRAcetam (KEPPRA) 500 MG tablet Take 1 tablet (500 mg total) by mouth 2 (two) times daily. 08/22/21   Van Clines, MD  ?   ? ?Allergies    ?Codeine, Hydrocodone-acetaminophen, and Percocet [oxycodone-acetaminophen]   ? ?Review of Systems   ?Review of Systems ? ?Physical Exam ?Updated Vital Signs ?BP 134/79 (BP Location: Left Arm)   Pulse 88   Temp 98.5 ?F (36.9 ?C) (Oral)   Resp 15   SpO2 100%  ? ?Physical Exam ?Vitals and nursing note reviewed.  ?Constitutional:   ?   Appearance: He is well-developed.  ?HENT:  ?   Head: Normocephalic and atraumatic. No raccoon eyes or Battle's sign.  ?   Comments: No signs of head trauma, no hematomas ?   Right Ear: Tympanic membrane, ear canal and external ear normal. No hemotympanum.  ?   Left Ear: Tympanic membrane, ear canal and external ear normal. No hemotympanum.  ?   Nose: Nose normal. No congestion or rhinorrhea.  ?Eyes:  ?   General: Lids are normal.  ?   Conjunctiva/sclera: Conjunctivae normal.   ?   Pupils: Pupils are equal, round, and reactive to light.  ?   Comments: No visible hyphema  ?Cardiovascular:  ?   Rate and Rhythm: Normal rate and regular rhythm.  ?Pulmonary:  ?   Effort: Pulmonary effort is normal.  ?Abdominal:  ?   Palpations: Abdomen is soft.  ?   Tenderness: There is no abdominal tenderness.  ?Musculoskeletal:     ?   General: Normal range of motion.  ?   Cervical back: Normal range of motion and neck supple. No tenderness or bony tenderness.  ?   Thoracic back: No tenderness or bony tenderness.  ?   Lumbar back: No tenderness or bony tenderness.  ?Skin: ?   General: Skin is warm and dry.  ?Neurological:  ?   Mental Status: He is alert and oriented to person, place, and time.  ?   GCS: GCS eye subscore is 4. GCS verbal subscore is 5. GCS motor subscore is 6.  ?   Cranial Nerves: No cranial nerve deficit.  ?   Sensory: No sensory deficit.  ?   Motor: No weakness.  ?   Coordination: Coordination normal.  ?   Deep Tendon Reflexes: Reflexes are normal and symmetric.  ?   Comments: Speech is clear  ? ? ?ED Results /  Procedures / Treatments   ?Labs ?(all labs ordered are listed, but only abnormal results are displayed) ?Labs Reviewed  ?CBC - Abnormal; Notable for the following components:  ?    Result Value  ? RDW 16.7 (*)   ? All other components within normal limits  ?BASIC METABOLIC PANEL - Abnormal; Notable for the following components:  ? Glucose, Bld 102 (*)   ? Creatinine, Ser 0.58 (*)   ? All other components within normal limits  ?ETHANOL - Abnormal; Notable for the following components:  ? Alcohol, Ethyl (B) 463 (*)   ? All other components within normal limits  ?CBG MONITORING, ED - Abnormal; Notable for the following components:  ? Glucose-Capillary 106 (*)   ? All other components within normal limits  ? ? ?EKG ?None ? ?Radiology ?No results found. ? ?Procedures ?Procedures  ? ? ?Medications Ordered in ED ?Medications  ?levETIRAcetam (KEPPRA) tablet 1,000 mg (has no administration in  time range)  ? ? ?ED Course/ Medical Decision Making/ A&P ?  ?Patient seen and examined. History obtained directly from patient, previous ED notes, previous neurology note at the end of last year.  States that seizures occurred early today.  Last states that seizures occurred early today.  Last alcoholic beverage was earlier today. ? ?Labs/EKG: We will check electrolytes given ongoing alcohol use disorder, CBC, EtOH level ? ?Imaging: Considered head imaging, however patient without signs of head or neck trauma on exam.  No obvious focal neurodeficits noted. ? ?Medications/Fluids: Ordered: 1000 mg oral Keppra. ? ?Most recent vital signs reviewed and are as follows: ?BP 134/79 (BP Location: Left Arm)   Pulse 88   Temp 98.5 ?F (36.9 ?C) (Oral)   Resp 15   SpO2 100%  ? ?Initial impression: Alcohol intoxication, homelessness, seizure disorder.  ? ?1:49 AM Reassessment performed. Patient appears stable.  ? ?Labs personally reviewed and interpreted including: CBC unremarkable without anemia; BMP unremarkable with normal electrolytes; alcohol 463 in line with previous values. ? ?Imaging personally visualized and interpreted including: ? ?Reviewed pertinent lab work and imaging with patient at bedside. Questions answered.  ? ?Most current vital signs reviewed and are as follows: ?BP 111/76   Pulse 98   Temp 98.5 ?F (36.9 ?C) (Oral)   Resp 12   SpO2 98%  ? ?Plan: Patient will need to safely ambulate prior to discharge.  No concern for alcohol withdrawal seizure given elevated EtOH.  Patient seems to chronically be at a very high level of alcohol given recent ED visits.  He is minimally intoxicated given the degree of his alcohol level today. ? ?2:33 AM Reassessment performed. Patient appears comfortable.  Currently sleeping.  He has had a sandwich.  RN notified earlier about the heart rate.  On my recheck, heart rate 103-105.  He does not appear to be tremulous or have other obvious signs of withdrawal. ? ?Plan:  Discharge when safe. ? ?6:19 AM Reassessment performed.  Patient has been up and ambulated.  He spent most of the night sleeping.  No seizure activity here.  No significant withdrawal suspected. ? ?Most current vital signs reviewed and are as follows: ?BP 123/80   Pulse (!) 103   Temp 98 ?F (36.7 ?C)   Resp 13   SpO2 96%  ? ?Plan: Discharge. ? ?Prescriptions written for: None ? ?ED return instructions discussed: Seizures, other concerns ? ?Follow-up instructions discussed: Patient encouraged to follow-up with their PCP in 2 days.  ? ? ?                        ?  Medical Decision Making ?Amount and/or Complexity of Data Reviewed ?Labs: ordered. ? ?Risk ?Prescription drug management. ? ? ?Patient here with alcohol intoxication.  There was concern for possible seizure earlier today per patient.  He is currently homeless and unfortunately has difficulty complying with medications due to his current social situation, homelessness.  No concern for significant head injury at this point.  Patient has spent most of the night sleeping without any problems.  Feel that he is stable for discharge to home at this time. ? ? ? ? ? ? ? ?Final Clinical Impression(s) / ED Diagnoses ?Final diagnoses:  ?Alcoholic intoxication without complication (HCC)  ?Seizure disorder (HCC)  ?Non compliance w medication regimen  ? ? ?Rx / DC Orders ?ED Discharge Orders   ? ? None  ? ?  ? ? ?  ?Renne Crigler, PA-C ?12/30/21 4268 ? ?  ?Paula Libra, MD ?12/30/21 3419 ? ?

## 2021-12-30 NOTE — Discharge Instructions (Addendum)
Your alcohol level was very high today.  You were given a dose of Keppra.  Please follow-up with your doctor soon as possible. ?

## 2021-12-30 NOTE — TOC Initial Note (Addendum)
Transition of Care (TOC) - Initial/Assessment Note  ? ? ?Patient Details  ?Name: Dwayne Bolton ?MRN: [id number] ?Date of Birth: 1976-02-01 ? ?Transition of Care (TOC) CM/SW Contact:    ?Lavenia Atlas, RN ?Phone Number: ?12/30/2021, 6:23 PM ? ?Clinical Narrative:                 ?Patient presented to [facility] ED for most recent ED visit today and previous ED visit for 12/29/21. Patient was brought in both visits via EMS due to seizures and +ETOH use. ? ? Patient Goals and CMS Choice ? RNCM spoke with patient he reports he is homeless and has no money to afford his medications. This RNCM explained the [facility] program to patient. Patient reports he is unable to afford the $3 copay for each medication and he has no family or friends that he can provide financial assistance. Patient reports he does not get any financial assistance. Patient is aware of services at the [facility]. Patient does not have a PCP, this RNCM added [facility] to AVS. Patient does not have a cell phone or any contact information. Patient will need a bus pass at discharge. Patient has no questions or concerns at this time. ? ?RNCM notified EDP and bedside RN of potential for [facility] assistance however need to know which medications patient will be discharged with. EDP continues to work patient up however knows that Keppra will be one of his d/c medications. ? ?TOC will continue to follow ?  ?Expected Discharge Plan and Services ?  ? Expected Discharge Plan: Continued Medical Work up ?  ?  ?  ?Prior Living Arrangements/Services ? homeless ?  ? Activities of Daily Living ?  ?  ? ?Permission Sought/Granted ?  ? Emotional Assessment ?  ? Admission diagnosis:  Seizure Disorder-Out of Keppra  ?Patient Active Problem List  ? Diagnosis Date Noted  ? Seizure (HCC) 07/11/2021  ? Alcohol withdrawal delirium (HCC) 07/09/2021  ? Acute lower UTI 07/09/2021  ? Tobacco dependence 12/31/2015  ? Seizure disorder (HCC) 12/31/2015  ? Convulsion (HCC)   ? Alcohol withdrawal seizure (HCC)  03/22/2015  ? GAD (generalized anxiety disorder) 01/13/2014  ? Adjustment disorder with mixed anxiety and depressed mood 01/13/2014  ? Seizures (HCC) 01/12/2014  ? Elevated liver enzymes 01/12/2014  ? Alcohol dependence (HCC) 01/11/2014  ? ?PCP:  Grayce Sessions, NP ?Pharmacy:   ?[id number] - [place] - [address] ?[address] ?[place] ([place] [address] ?Phone: [phone] Fax: [phone] ? ? ? ? ?Social Determinants of Health (SDOH) Interventions ?  ? ?Readmission Risk Interventions ?No flowsheet data found. ? ? ?

## 2021-12-30 NOTE — ED Provider Notes (Incomplete)
[facility]-EMERGENCY DEPT Provider Note   CSN: [id number] Arrival date & time: 12/30/21  1641     History {Add pertinent medical, surgical, social history, OB history to HPI:1} Chief Complaint  Patient presents with   Altered Mental Status    Dwayne Bolton is a 46 y.o. male.  The history is provided by the patient and medical records. No language interpreter was used.  Altered Mental Status Presenting symptoms: no confusion   Severity:  Severe Most recent episode:  Today Episode history:  Single Timing:  Unable to specify Chronicity:  Recurrent Context: homelessness and not taking medications as prescribed   Associated symptoms: seizures   Associated symptoms: no abdominal pain, normal movement, no agitation, no depression, no fever, no hallucinations, no headaches, no light-headedness, no nausea, no palpitations, no rash, no visual change, no vomiting and no weakness       Home Medications Prior to Admission medications   Medication Sig Start Date End Date Taking? Authorizing Provider  levETIRAcetam (KEPPRA) 500 MG tablet Take 1 tablet (500 mg total) by mouth 2 (two) times daily. 08/22/21   Van Clines, MD      Allergies    Codeine, Hydrocodone-acetaminophen, and Percocet [oxycodone-acetaminophen]    Review of Systems   Review of Systems  Constitutional:  Positive for fatigue. Negative for chills, diaphoresis, fever and unexpected weight change.  HENT:  Negative for congestion.   Eyes:  Negative for visual disturbance.  Respiratory:  Negative for cough, chest tightness, shortness of breath and wheezing.   Cardiovascular:  Negative for chest pain and palpitations.  Gastrointestinal:  Negative for abdominal pain, constipation, diarrhea, nausea and vomiting.  Genitourinary:  Negative for dysuria, flank pain and frequency.  Musculoskeletal:  Positive for back pain. Negative for neck pain and neck stiffness.  Skin:  Negative for rash and wound.   Neurological:  Positive for seizures. Negative for dizziness, weakness, light-headedness and headaches.  Psychiatric/Behavioral:  Negative for agitation, confusion and hallucinations.   All other systems reviewed and are negative.  Physical Exam Updated Vital Signs BP 125/79    Pulse (!) 112    Temp 99.3 F (37.4 C) (Oral)    Resp 18    Ht 5\' 11"  (1.803 m)    Wt 77.1 kg    SpO2 95%    BMI 23.71 kg/m  Physical Exam Vitals and nursing note reviewed.  Constitutional:      General: He is not in acute distress.    Appearance: He is well-developed. He is not ill-appearing, toxic-appearing or diaphoretic.  HENT:     Head: Normocephalic and atraumatic.     Nose: No congestion or rhinorrhea.     Mouth/Throat:     Mouth: Mucous membranes are dry.     Pharynx: No oropharyngeal exudate or posterior oropharyngeal erythema.  Eyes:     Extraocular Movements: Extraocular movements intact.     Conjunctiva/sclera: Conjunctivae normal.     Pupils: Pupils are equal, round, and reactive to light.  Cardiovascular:     Rate and Rhythm: Normal rate and regular rhythm.     Heart sounds: No murmur heard. Pulmonary:     Effort: Pulmonary effort is normal. No respiratory distress.     Breath sounds: Normal breath sounds. No wheezing, rhonchi or rales.  Chest:     Chest wall: No tenderness.  Abdominal:     General: Abdomen is flat.     Palpations: Abdomen is soft.     Tenderness: There is no  abdominal tenderness. There is no right CVA tenderness, left CVA tenderness, guarding or rebound.  Musculoskeletal:        General: No swelling.     Cervical back: Neck supple. No tenderness.     Thoracic back: Tenderness present. No bony tenderness.     Lumbar back: Tenderness present. No bony tenderness.       Back:     Right lower leg: No edema.     Left lower leg: No edema.  Skin:    General: Skin is warm and dry.     Capillary Refill: Capillary refill takes less than 2 seconds.     Findings: No erythema  or rash.  Neurological:     General: No focal deficit present.     Mental Status: He is alert.     Sensory: No sensory deficit.     Motor: No weakness.  Psychiatric:        Mood and Affect: Mood normal.    ED Results / Procedures / Treatments   Labs (all labs ordered are listed, but only abnormal results are displayed) Labs Reviewed  CBG MONITORING, ED - Abnormal; Notable for the following components:      Result Value   Glucose-Capillary 102 (*)    All other components within normal limits  URINE CULTURE  RESP PANEL BY RT-PCR (FLU A&B, COVID) ARPGX2  CBC WITH DIFFERENTIAL/PLATELET  COMPREHENSIVE METABOLIC PANEL  ETHANOL  URINALYSIS, ROUTINE W REFLEX MICROSCOPIC  RAPID URINE DRUG SCREEN, HOSP PERFORMED  AMMONIA  TSH    EKG None  Radiology No results found.  Procedures Procedures  {Document cardiac monitor, telemetry assessment procedure when appropriate:1}  Medications Ordered in ED Medications  sodium chloride 0.9 % bolus 1,000 mL (has no administration in time range)  levETIRAcetam (KEPPRA) IVPB 1000 mg/100 mL premix (has no administration in time range)    ED Course/ Medical Decision Making/ A&P                           Medical Decision Making Amount and/or Complexity of Data Reviewed Labs: ordered. Radiology: ordered.  Risk Prescription drug management.    Dwayne Bolton is a 46 y.o. male with a past medical history significant for alcohol abuse and seizure disorder who presents with recurrent seizure.  According to EMS report to nursing and from patient, he had a seizure today prompting him to be brought back to emergency department.  Chart review shows that he has been seen 4 times in the last week including being discharged earlier this morning for a recurrent seizure.  Patient reports he continues drink alcohol and had "several beers" today.  He says that he does not have any prescriptions for his Keppra and has not taken it but he was reportedly  given Keppra during his previous ED visit.  He denies hitting his head and denies any headache or neck pain.  He did not fall and hit his head but he did fall and hurt his back.  Reports some mild to moderate pain in his thoracic and lumbar spine.  He denies any numbness, tingling, weakness of extremities and denies other complaints.  He just feels tired.  Denies any nausea, vomiting, constipation, diarrhea, or urinary changes.  Denies any other complaints.  On exam, patient is somnolent but arousable to loud voice.  Lungs clear and chest nontender.  Abdomen nontender.  He is moving all extremities normal sensation and strength.  Pupils symmetric and reactive.  He does have some nystagmus bilaterally and some injected conjunctiva.  No tenderness in the neck or head.  Some mild tenderness paraspinally in the thoracic and lumbar spine.  No other focal neurologic deficits appreciated.  Clinically I do suspect patient had another seizure as he has been having them for the last week related to not having Keppra and drinking more alcohol.  Patient is tachycardic so we will give some fluids as he appears clinically dehydrated.  We will get repeat labs including a urinalysis and a EtOH level.  We will check for COVID and flu.  We will load with Keppra.  After reports he did not hit his head and is denying any headache or neck injury, will hold on repeat CT head as he had a CT head several days ago that was reassuring.  We will see if social work can speak with patient to discuss helping him with medications and ultimately will reassess after work-up is completed to determine disposition.     {Document critical care time when appropriate:1} {Document review of labs and clinical decision tools ie heart score, Chads2Vasc2 etc:1}  {Document your independent review of radiology images, and any outside records:1} {Document your discussion with family members, caretakers, and with consultants:1} {Document social  determinants of health affecting pt's care:1} {Document your decision making why or why not admission, treatments were needed:1} Final Clinical Impression(s) / ED Diagnoses Final diagnoses:  None    Rx / DC Orders ED Discharge Orders     None

## 2021-12-30 NOTE — ED Triage Notes (Signed)
[place] States bystanders found pt passed out on the sidewalk. Pt states he has not been taking seizure medication. + ETOH. ?

## 2021-12-31 ENCOUNTER — Telehealth: Payer: Self-pay | Admitting: Surgery

## 2021-12-31 MED ORDER — LEVETIRACETAM 500 MG PO TABS: 500.0000 mg | ORAL_TABLET | Freq: Two times a day (BID) | ORAL | 0 refills | Status: DC

## 2021-12-31 NOTE — Telephone Encounter (Signed)
?  MATCH Medication Assistance Card ?Name: Sheri Prows  ?ID (MRN): [id number] ?Bin: [id number] RX Group: [id number] ?Discharge Date: 12/30/2021 ?Expiration Date:  01/11/2022  ?                                         (must be filled within 7 days of discharge) ? ? ? ? ? ?Dear   : Dwayne Bolton  ? ?You have been approved to have the prescriptions written by your discharging physician filled through our [facility] (Medication Assistance Through [facility]) program. This program allows for a one-time (no refills) 34-day supply of selected medications for a low copay amount. ? ?The copay is $0 per prescription. For instance, if you have one prescription, you will pay $0; for two prescriptions, you pay $0; for three prescriptions, you pay $0; and so on. ? ?Only certain pharmacies are participating in this program with [facility]. You will need to select one of the pharmacies from the attached list and take your prescriptions, this letter, and your photo ID to one of the participating pharmacies.  ? ?We are excited that you are able to use the [facility] program to get your medications. These prescriptions must be filled within 7 days of hospital discharge or they will no longer be valid for the [facility] program. Should you have any problems with your prescriptions please contact your case management team member at [phone] for [facility]/[place]/ [facility]. ? ?Thank you, ?[facility] ? ?   ?

## 2021-12-31 NOTE — ED Notes (Signed)
Patient ambulated without assistance, patient had a steady gait.  ?

## 2021-12-31 NOTE — ED Provider Notes (Signed)
Care assumed from Dr. Janelle Floor, patient with alcohol intoxication and seizure, noncompliant with anticonvulsants.  He has received a loading dose of levetiracetam and is being observed until he is sober enough to be discharged. ? ?Patient was observed overnight and has been resting comfortably.  He is now awake and alert and able to ambulate without assistance and felt to be safe for discharge. ?  ?Dione Booze, MD ?12/31/21 3664 ? ?

## 2022-01-01 ENCOUNTER — Emergency Department (HOSPITAL_COMMUNITY)
Admission: EM | Admit: 2022-01-01 | Discharge: 2022-01-01 | Disposition: A | Discharge status: Home / Self Care | Payer: Self-pay | Attending: Emergency Medicine | Admitting: Emergency Medicine

## 2022-01-01 ENCOUNTER — Emergency Department (HOSPITAL_COMMUNITY): Payer: Self-pay

## 2022-01-01 ENCOUNTER — Encounter (HOSPITAL_COMMUNITY): Payer: Self-pay

## 2022-01-01 ENCOUNTER — Other Ambulatory Visit: Payer: Self-pay

## 2022-01-01 DIAGNOSIS — Z5900 Homelessness unspecified: Secondary | ICD-10-CM | POA: Insufficient documentation

## 2022-01-01 DIAGNOSIS — F10129 Alcohol abuse with intoxication, unspecified: Secondary | ICD-10-CM | POA: Insufficient documentation

## 2022-01-01 DIAGNOSIS — F10920 Alcohol use, unspecified with intoxication, uncomplicated: Secondary | ICD-10-CM

## 2022-01-01 DIAGNOSIS — F419 Anxiety disorder, unspecified: Secondary | ICD-10-CM | POA: Insufficient documentation

## 2022-01-01 DIAGNOSIS — R569 Unspecified convulsions: Secondary | ICD-10-CM | POA: Insufficient documentation

## 2022-01-01 LAB — CBC WITH DIFFERENTIAL/PLATELET
Abs Immature Granulocytes: 0.02 10*3/uL (ref 0.00–0.07)
Basophils Absolute: 0.1 10*3/uL (ref 0.0–0.1)
Basophils Relative: 2 %
Eosinophils Absolute: 0 10*3/uL (ref 0.0–0.5)
Eosinophils Relative: 0 %
HCT: 39.7 % (ref 39.0–52.0)
Hemoglobin: 13.8 g/dL (ref 13.0–17.0)
Immature Granulocytes: 0 %
Lymphocytes Relative: 27 %
Lymphs Abs: 2 10*3/uL (ref 0.7–4.0)
MCH: 33 pg (ref 26.0–34.0)
MCHC: 34.8 g/dL (ref 30.0–36.0)
MCV: 95 fL (ref 80.0–100.0)
Monocytes Absolute: 1.4 10*3/uL — ABNORMAL HIGH (ref 0.1–1.0)
Monocytes Relative: 20 %
Neutro Abs: 3.8 10*3/uL (ref 1.7–7.7)
Neutrophils Relative %: 51 %
Platelets: 135 10*3/uL — ABNORMAL LOW (ref 150–400)
RBC: 4.18 MIL/uL — ABNORMAL LOW (ref 4.22–5.81)
RDW: 16.3 % — ABNORMAL HIGH (ref 11.5–15.5)
WBC: 7.4 10*3/uL (ref 4.0–10.5)
nRBC: 0 % (ref 0.0–0.2)

## 2022-01-01 LAB — BASIC METABOLIC PANEL
Anion gap: 10 (ref 5–15)
BUN: 9 mg/dL (ref 6–20)
CO2: 28 mmol/L (ref 22–32)
Calcium: 8.9 mg/dL (ref 8.9–10.3)
Chloride: 99 mmol/L (ref 98–111)
Creatinine, Ser: 0.86 mg/dL (ref 0.61–1.24)
GFR, Estimated: >60 mL/min (ref >60)
Glucose, Bld: 98 mg/dL (ref 70–99)
Potassium: 3.1 mmol/L — ABNORMAL LOW (ref 3.5–5.1)
Sodium: 137 mmol/L (ref 135–145)

## 2022-01-01 LAB — RAPID URINE DRUG SCREEN, HOSP PERFORMED
Amphetamines: NOT DETECTED
Barbiturates: NOT DETECTED
Benzodiazepines: NOT DETECTED
Cocaine: NOT DETECTED
Opiates: NOT DETECTED
Tetrahydrocannabinol: NOT DETECTED

## 2022-01-01 LAB — URINE CULTURE: Culture: NO GROWTH

## 2022-01-01 LAB — ETHANOL: Alcohol, Ethyl (B): 435 mg/dL (ref <10)

## 2022-01-01 LAB — CBG MONITORING, ED: Glucose-Capillary: 101 mg/dL — ABNORMAL HIGH (ref 70–99)

## 2022-01-01 MED ORDER — THIAMINE HCL 100 MG PO TABS
100.0000 mg | ORAL_TABLET | Freq: Every day | ORAL | Status: DC
  Administered 2022-01-01: 100 mg via ORAL
  Filled 2022-01-01: qty 1

## 2022-01-01 MED ORDER — LEVETIRACETAM IN NACL 1000 MG/100ML IV SOLN
1000.0000 mg | Freq: Once | INTRAVENOUS | Status: AC
  Administered 2022-01-01: 1000 mg via INTRAVENOUS
  Filled 2022-01-01: qty 100

## 2022-01-01 MED ORDER — POTASSIUM CHLORIDE 10 MEQ/100ML IV SOLN
10.0000 meq | INTRAVENOUS | Status: AC
  Administered 2022-01-01 (×2): 10 meq via INTRAVENOUS
  Filled 2022-01-01 (×2): qty 100

## 2022-01-01 MED ORDER — LORAZEPAM 2 MG/ML IJ SOLN: 0.0000 mg | Freq: Two times a day (BID) | INTRAMUSCULAR | Status: DC

## 2022-01-01 MED ORDER — THIAMINE HCL 100 MG/ML IJ SOLN: 100.0000 mg | Freq: Every day | INTRAMUSCULAR | Status: DC

## 2022-01-01 MED ORDER — LORAZEPAM 1 MG PO TABS: 0.0000 mg | ORAL_TABLET | Freq: Two times a day (BID) | ORAL | Status: DC

## 2022-01-01 MED ORDER — LORAZEPAM 2 MG/ML IJ SOLN: 0.0000 mg | Freq: Four times a day (QID) | INTRAMUSCULAR | Status: DC

## 2022-01-01 MED ORDER — FOLIC ACID 1 MG PO TABS
1.0000 mg | ORAL_TABLET | Freq: Every day | ORAL | Status: DC
  Administered 2022-01-01: 1 mg via ORAL
  Filled 2022-01-01: qty 1

## 2022-01-01 MED ORDER — SODIUM CHLORIDE 0.9 % IV BOLUS
1000.0000 mL | Freq: Once | INTRAVENOUS | Status: AC
  Administered 2022-01-01: 1000 mL via INTRAVENOUS

## 2022-01-01 MED ORDER — LORAZEPAM 1 MG PO TABS: 0.0000 mg | ORAL_TABLET | Freq: Four times a day (QID) | ORAL | Status: DC

## 2022-01-01 NOTE — Discharge Instructions (Addendum)
Is vitally important for you to adjust your alcoholism. ? ?Start taking your Keppra as prescribed.  You have been given a match letter to help you afford your medications and they were sent to the pharmacy at [facility] on 3/14.  Please take this letter to the pharmacy to receive your seizure medication at no cost. ? ?Return for new or worsening symptoms. ? ? ?

## 2022-01-01 NOTE — ED Triage Notes (Signed)
Pt BIB [facility] after being found on the sidewalk. Pt states that he hasn't been able to afford his seizure meds and reports a seizure. +ETOH. VSS. ?

## 2022-01-01 NOTE — ED Notes (Signed)
Provided patient with discharge instructions and medication voucher. Patient denies any other needs at this time ?

## 2022-01-01 NOTE — ED Provider Notes (Signed)
Care assumed from PA [facility] at shift change, please see his note for full details, but in brief Dwayne Bolton is a 46 y.o. male who presents via EMS after found on the ground, endorses drinking, patient concerned he may have had a seizure, has not been able to get his Keppra. ? ?Laboratory work-up significant for alcohol level of 435 and mild hypokalemia, and CT imaging of the head and cervical spine unremarkable.  Patient given fluids, potassium replacement and IV Keppra dose.  Will be monitored here in the emergency department until he is clinically sober ? ?Plan: Anticipate discharge home once patient can ambulate, eat and drink. ? ? ?BP 115/70   Pulse 92   Temp 97.6 ?F (36.4 ?C)   Resp 16   Ht 5\' 11"  (1.803 m)   Wt 77.1 kg   SpO2 96%   BMI 23.71 kg/m?  ? ? ?Procedures  ?Procedures ? ?ED Course / MDM  ? ?Clinical Course as of 01/01/22 1026  ?Wed Jan 01, 2022  ?0212 Pt does not answer questions but follows commands. Moves all four extremities. Nodes yes when asked if he feels touch on all four extremities.  [WF]  ?0250 IMPRESSION: ?1.  No acute intracranial process. ?2.  No acute fracture or traumatic listhesis in the cervical spine. ? ? [WF]  ?  ?Clinical Course User Index ?[WF] 0213, [facility]  ? ?Medical Decision Making ?Amount and/or Complexity of Data Reviewed ?External Data Reviewed: notes. ?Labs: ordered. ?Radiology: ordered and independent interpretation performed. ? ?Risk ?OTC drugs. ?Prescription drug management. ? ? ?After further monitoring here in the emergency department patient is now awake and alert able to ambulate without assistance and eating and drinking.  At this time he is clinically sober and appropriate for discharge.  He was given a [place] letter to receive his seizure medications yesterday but it does not look like he has picked up these medicines so I have reprinted the match voucher for the patient and encouraged him to go to the [place] [facility] to pick up  his Keppra. ? ?His brother is here to pick him up and at this time he is stable for discharge home. ? ? ? ? ?  ?[facility], PA-C ?01/01/22 1104 ? ?  ?01/03/22, MD ?01/04/22 1531 ? ?

## 2022-01-01 NOTE — ED Notes (Signed)
Pt sleeping at this time.  Attempted to wake pt for 0800 ativan.  Pt mumbles however does not speak and pulls covers back over his head.  Will continue to monitor.  ?

## 2022-01-01 NOTE — ED Notes (Signed)
TOC consulted for medication assistance. Per chart review, pt was provided [facility] voucher on 3/14 completed by [facility]. CSW updated treatment team that MATCH can be reprinted and pt will need to take it to the pharmacy in order to get medications that are needed. Pt has 7 days to use the voucher. If not used before 7 days it is no longer effective for use.  ?

## 2022-01-01 NOTE — ED Provider Notes (Signed)
?[facility]-EMER[facility] DEPT ?Provider Note ? ? ?CSN: [id number] ?Arrival date & time: 01/01/22  0109 ? ?  ? ?History ? ?Chief Complaint  ?Patient presents with  ? Seizures  ? ? ?Dwayne Bolton is a 46 y.o. male. ? ? ?Seizures ? ?Patient is a 46 year old male with past medical history significant for seizures, alcohol abuse, homelessness, Anxiety ? ?Patient presents emergency room today with EMS after being found on the sidewalk on the ground.  Patient was arousable and informed EMS on their arrival that he had been drinking and has had a seizure. ? ?He was brought to the emergency room he did not receive any medications in route. ? ?He has been conversant since arrival.  However he is not very forthcoming and occasionally refuses to answer questions or falls asleep during our discussion. ? ? ? ?  ? ?Home Medications ?Prior to Admission medications   ?Medication Sig Start Date End Date Taking? Authorizing Provider  ?levETIRAcetam (KEPPRA) 500 MG tablet Take 1 tablet (500 mg total) by mouth 2 (two) times daily. 08/22/21   Van Clines, MD  ?levETIRAcetam (KEPPRA) 500 MG tablet Take 1 tablet (500 mg total) by mouth 2 (two) times daily. 12/31/21 01/30/22  Dione Booze, MD  ?   ? ?Allergies    ?Codeine, Hydrocodone-acetaminophen, and Percocet [oxycodone-acetaminophen]   ? ?Review of Systems   ?Review of Systems  ?Neurological:  Positive for seizures.  ? ?Physical Exam ?Updated Vital Signs ?BP 114/72   Pulse 78   Temp 97.6 ?F (36.4 ?C)   Resp 16   Ht 5\' 11"  (1.803 m)   Wt 77.1 kg   SpO2 97%   BMI 23.71 kg/m?  ?Physical Exam ?Vitals and nursing note reviewed.  ?Constitutional:   ?   General: He is not in acute distress. ?   Comments: Malodorous, disheveled 46 year old male.  In cervical collar.  ?HENT:  ?   Head: Normocephalic and atraumatic.  ?   Nose: Nose normal.  ?   Mouth/Throat:  ?   Mouth: Mucous membranes are dry.  ?Eyes:  ?   General: No scleral icterus. ?Cardiovascular:  ?   Rate and  Rhythm: Normal rate and regular rhythm.  ?   Pulses: Normal pulses.  ?   Heart sounds: Normal heart sounds.  ?Pulmonary:  ?   Effort: Pulmonary effort is normal. No respiratory distress.  ?   Breath sounds: No wheezing.  ?Abdominal:  ?   Palpations: Abdomen is soft.  ?   Tenderness: There is no abdominal tenderness. There is no guarding or rebound.  ?Musculoskeletal:  ?   Cervical back: Normal range of motion.  ?   Right lower leg: No edema.  ?   Left lower leg: No edema.  ?   Comments: No T or L-spine tenderness.  Unable to palpate cervical spine through collar  ?Skin: ?   General: Skin is warm and dry.  ?   Capillary Refill: Capillary refill takes less than 2 seconds.  ?Neurological:  ?   Mental Status: He is alert. Mental status is at baseline.  ?   Comments: Moves all 4 extremities, answers basic questions with yes or no.  Sensation intact in all 4 extremities  ?Psychiatric:     ?   Mood and Affect: Mood normal.     ?   Behavior: Behavior normal.  ? ? ?ED Results / Procedures / Treatments   ?Labs ?(all labs ordered are listed, but only abnormal results are  displayed) ?Labs Reviewed  ?CBC WITH DIFFERENTIAL/PLATELET - Abnormal; Notable for the following components:  ?    Result Value  ? RBC 4.18 (*)   ? RDW 16.3 (*)   ? Platelets 135 (*)   ? Monocytes Absolute 1.4 (*)   ? All other components within normal limits  ?BASIC METABOLIC PANEL - Abnormal; Notable for the following components:  ? Potassium 3.1 (*)   ? All other components within normal limits  ?ETHANOL - Abnormal; Notable for the following components:  ? Alcohol, Ethyl (B) 435 (*)   ? All other components within normal limits  ?CBG MONITORING, ED - Abnormal; Notable for the following components:  ? Glucose-Capillary 101 (*)   ? All other components within normal limits  ?RAPID URINE DRUG SCREEN, HOSP PERFORMED  ?LEVETIRACETAM LEVEL  ? ? ?EKG ?None ? ?Radiology ?DG Thoracic Spine 2 View ? ?Result Date: 12/30/2021 ?CLINICAL DATA:  Back pain after fall,  intoxicated, history of seizures EXAM: THORACIC SPINE 2 VIEWS COMPARISON:  07/25/2021 FINDINGS: Frontal and lateral views of the thoracic spine are obtained. Mild anterior wedging of T6 and T7 is unchanged. There are no acute displaced fractures. Alignment is anatomic. Disc spaces are well preserved. Paraspinal soft tissues are unremarkable. IMPRESSION: 1. No acute bony abnormality. Electronically Signed   By: Sharlet Salina M.D.   On: 12/30/2021 18:02  ? ?DG Lumbar Spine Complete ? ?Result Date: 12/30/2021 ?CLINICAL DATA:  Fall, back pain EXAM: LUMBAR SPINE - COMPLETE 4+ VIEW COMPARISON:  03/13/2020 FINDINGS: There is no evidence of lumbar spine fracture. Alignment is normal. Intervertebral disc spaces are maintained. IMPRESSION: Negative. Electronically Signed   By: Charlett Nose M.D.   On: 12/30/2021 18:02  ? ?CT HEAD WO CONTRAST ( ) ? ?Result Date: 01/01/2022 ?CLINICAL DATA:  Found on sidewalk after having seizure EXAM: CT HEAD WITHOUT CONTRAST CT CERVICAL SPINE WITHOUT CONTRAST TECHNIQUE: Multidetector CT imaging of the head and cervical spine was performed following the standard protocol without intravenous contrast. Multiplanar CT image reconstructions of the cervical spine were also generated. RADIATION DOSE REDUCTION: This exam was performed according to the departmental dose-optimization program which includes automated exposure control, adjustment of the mA and/or kV according to patient size and/or use of iterative reconstruction technique. COMPARISON:  12/25/2021 CT head, 07/25/2021 CT cervical spine FINDINGS: CT HEAD FINDINGS Brain: No evidence of acute infarction, hemorrhage, cerebral edema, mass, mass effect, or midline shift. No hydrocephalus or extra-axial fluid collection. Vascular: No hyperdense vessel. Skull: Normal. Negative for fracture or focal lesion. Sinuses/Orbits: Mucosal thickening in the anterior ethmoid air cells and inferior left-greater-than-right frontal sinus. Otherwise negative.  Other: The mastoid air cells are well aerated. CT CERVICAL SPINE FINDINGS Alignment: Physiologic. Skull base and vertebrae: No acute fracture. No primary bone lesion or focal pathologic process. Soft tissues and spinal canal: No prevertebral fluid or swelling. No visible canal hematoma. Disc levels: Disc heights are preserved. No significant spinal canal stenosis. Upper chest: Negative. Other: None. IMPRESSION: 1.  No acute intracranial process. 2.  No acute fracture or traumatic listhesis in the cervical spine. Electronically Signed   By: Wiliam Ke M.D.   On: 01/01/2022 02:44  ? ?CT Cervical Spine Wo Contrast ? ?Result Date: 01/01/2022 ?CLINICAL DATA:  Found on sidewalk after having seizure EXAM: CT HEAD WITHOUT CONTRAST CT CERVICAL SPINE WITHOUT CONTRAST TECHNIQUE: Multidetector CT imaging of the head and cervical spine was performed following the standard protocol without intravenous contrast. Multiplanar CT image reconstructions of the cervical spine were  also generated. RADIATION DOSE REDUCTION: This exam was performed according to the departmental dose-optimization program which includes automated exposure control, adjustment of the mA and/or kV according to patient size and/or use of iterative reconstruction technique. COMPARISON:  12/25/2021 CT head, 07/25/2021 CT cervical spine FINDINGS: CT HEAD FINDINGS Brain: No evidence of acute infarction, hemorrhage, cerebral edema, mass, mass effect, or midline shift. No hydrocephalus or extra-axial fluid collection. Vascular: No hyperdense vessel. Skull: Normal. Negative for fracture or focal lesion. Sinuses/Orbits: Mucosal thickening in the anterior ethmoid air cells and inferior left-greater-than-right frontal sinus. Otherwise negative. Other: The mastoid air cells are well aerated. CT CERVICAL SPINE FINDINGS Alignment: Physiologic. Skull base and vertebrae: No acute fracture. No primary bone lesion or focal pathologic process. Soft tissues and spinal canal: No  prevertebral fluid or swelling. No visible canal hematoma. Disc levels: Disc heights are preserved. No significant spinal canal stenosis. Upper chest: Negative. Other: None. IMPRESSION: 1.  No acute intracranial process

## 2022-01-01 NOTE — ED Notes (Signed)
Pt is able to walk the hallway and shortly after work to the bathroom by RM-25 without assistance.   ?

## 2022-01-02 LAB — LEVETIRACETAM LEVEL: Levetiracetam Lvl: 3.3 ug/mL — ABNORMAL LOW (ref 10.0–40.0)

## 2022-02-12 ENCOUNTER — Ambulatory Visit (INDEPENDENT_AMBULATORY_CARE_PROVIDER_SITE_OTHER): Payer: Self-pay | Admitting: Primary Care

## 2022-02-24 ENCOUNTER — Ambulatory Visit: Payer: Self-pay | Admitting: Neurology

## 2022-03-13 ENCOUNTER — Encounter (HOSPITAL_COMMUNITY): Payer: Self-pay | Admitting: Emergency Medicine

## 2022-03-13 ENCOUNTER — Other Ambulatory Visit (HOSPITAL_COMMUNITY): Payer: Self-pay

## 2022-03-13 ENCOUNTER — Emergency Department (HOSPITAL_COMMUNITY)
Admission: EM | Admit: 2022-03-13 | Discharge: 2022-03-13 | Disposition: A | Discharge status: Home / Self Care | Payer: Self-pay | Attending: Emergency Medicine | Admitting: Emergency Medicine

## 2022-03-13 ENCOUNTER — Other Ambulatory Visit: Payer: Self-pay

## 2022-03-13 DIAGNOSIS — F10920 Alcohol use, unspecified with intoxication, uncomplicated: Secondary | ICD-10-CM

## 2022-03-13 DIAGNOSIS — F10120 Alcohol abuse with intoxication, uncomplicated: Secondary | ICD-10-CM | POA: Insufficient documentation

## 2022-03-13 DIAGNOSIS — Y908 Blood alcohol level of 240 mg/100 ml or more: Secondary | ICD-10-CM | POA: Insufficient documentation

## 2022-03-13 LAB — CBC WITH DIFFERENTIAL/PLATELET
Abs Immature Granulocytes: 0.03 10*3/uL (ref 0.00–0.07)
Basophils Absolute: 0.1 10*3/uL (ref 0.0–0.1)
Basophils Relative: 2 %
Eosinophils Absolute: 0 10*3/uL (ref 0.0–0.5)
Eosinophils Relative: 1 %
HCT: 39.5 % (ref 39.0–52.0)
Hemoglobin: 13.8 g/dL (ref 13.0–17.0)
Immature Granulocytes: 1 %
Lymphocytes Relative: 28 %
Lymphs Abs: 1.4 10*3/uL (ref 0.7–4.0)
MCH: 33.9 pg (ref 26.0–34.0)
MCHC: 34.9 g/dL (ref 30.0–36.0)
MCV: 97.1 fL (ref 80.0–100.0)
Monocytes Absolute: 0.7 10*3/uL (ref 0.1–1.0)
Monocytes Relative: 15 %
Neutro Abs: 2.6 10*3/uL (ref 1.7–7.7)
Neutrophils Relative %: 53 %
Platelets: 95 10*3/uL — ABNORMAL LOW (ref 150–400)
RBC: 4.07 MIL/uL — ABNORMAL LOW (ref 4.22–5.81)
RDW: 13.9 % (ref 11.5–15.5)
WBC: 4.9 10*3/uL (ref 4.0–10.5)
nRBC: 0 % (ref 0.0–0.2)

## 2022-03-13 LAB — COMPREHENSIVE METABOLIC PANEL
ALT: 125 U/L — ABNORMAL HIGH (ref 0–44)
AST: 302 U/L — ABNORMAL HIGH (ref 15–41)
Albumin: 4 g/dL (ref 3.5–5.0)
Alkaline Phosphatase: 145 U/L — ABNORMAL HIGH (ref 38–126)
Anion gap: 13 (ref 5–15)
BUN: 7 mg/dL (ref 6–20)
CO2: 30 mmol/L (ref 22–32)
Calcium: 8.5 mg/dL — ABNORMAL LOW (ref 8.9–10.3)
Chloride: 100 mmol/L (ref 98–111)
Creatinine, Ser: 0.77 mg/dL (ref 0.61–1.24)
GFR, Estimated: >60 mL/min (ref >60)
Glucose, Bld: 88 mg/dL (ref 70–99)
Potassium: 4.2 mmol/L (ref 3.5–5.1)
Sodium: 143 mmol/L (ref 135–145)
Total Bilirubin: 0.7 mg/dL (ref 0.3–1.2)
Total Protein: 7 g/dL (ref 6.5–8.1)

## 2022-03-13 LAB — RAPID URINE DRUG SCREEN, HOSP PERFORMED
Amphetamines: NOT DETECTED
Barbiturates: NOT DETECTED
Benzodiazepines: NOT DETECTED
Cocaine: NOT DETECTED
Opiates: NOT DETECTED
Tetrahydrocannabinol: NOT DETECTED

## 2022-03-13 LAB — ETHANOL: Alcohol, Ethyl (B): 447 mg/dL (ref <10)

## 2022-03-13 MED ORDER — IBUPROFEN 800 MG PO TABS
800.0000 mg | ORAL_TABLET | Freq: Once | ORAL | Status: AC
  Administered 2022-03-13: 800 mg via ORAL
  Filled 2022-03-13: qty 1

## 2022-03-13 MED ORDER — LEVETIRACETAM 500 MG PO TABS
500.0000 mg | ORAL_TABLET | Freq: Two times a day (BID) | ORAL | 0 refills | Status: AC
  Filled 2022-03-13: qty 60, 30d supply, fill #0

## 2022-03-13 MED ORDER — LEVETIRACETAM 500 MG PO TABS
500.0000 mg | ORAL_TABLET | Freq: Once | ORAL | Status: AC
Start: 2022-03-13 — End: 2022-03-13
  Administered 2022-03-13: 500 mg via ORAL
  Filled 2022-03-13: qty 1

## 2022-03-13 MED ORDER — THIAMINE HCL 100 MG PO TABS
100.0000 mg | ORAL_TABLET | Freq: Every day | ORAL | Status: DC
  Administered 2022-03-13: 100 mg via ORAL
  Filled 2022-03-13: qty 1

## 2022-03-13 MED ORDER — SODIUM CHLORIDE 0.9 % IV BOLUS
1000.0000 mL | Freq: Once | INTRAVENOUS | Status: AC
  Administered 2022-03-13: 1000 mL via INTRAVENOUS

## 2022-03-13 NOTE — ED Notes (Signed)
Pt was able to ambulated without assistance back and forth in the hallway. MD advise

## 2022-03-13 NOTE — ED Notes (Signed)
Pt ambulated out the ER with a steady gait.

## 2022-03-13 NOTE — ED Notes (Signed)
Pt aware of need for urine sample. States unable to provide one at this time, pt given urinal.

## 2022-03-13 NOTE — ED Provider Notes (Signed)
Emergency Department Provider Note   I have reviewed the triage vital signs and the nursing notes.   HISTORY  Chief Complaint Alcohol Intoxication   HPI Dwayne Bolton is a 46 y.o. male with past medical history of alcohol abuse and epilepsy, poorly compliant with Keppra due to cost, presents to the emergency department after being found asleep on the sidewalk.  EMS reported finding him next to a bottle of alcohol.  Patient endorses drinking 2 beers.  Denies any known fall or seizure prodrome.  He is poorly compliant with his Keppra due to excessive cost.  He denies any headache.  He has not noticed any lacerations or bleeding.  No pain in the arms or legs.  No feeling of tongue injury.   Past Medical History:  Diagnosis Date   Alcohol abuse    Seizure (HCC)     Review of Systems  Constitutional: No fever/chills Eyes: No visual changes. ENT: No sore throat. Cardiovascular: Denies chest pain. Respiratory: Denies shortness of breath. Gastrointestinal: No abdominal pain.  No nausea, no vomiting.  No diarrhea.  No constipation. Genitourinary: Negative for dysuria. Musculoskeletal: Negative for back pain. Skin: Negative for rash. Neurological: Negative for headaches, focal weakness or numbness.   ____________________________________________   PHYSICAL EXAM:  VITAL SIGNS: ED Triage Vitals  Enc Vitals Group     BP 03/13/22 0753 (!) 141/104     Pulse Rate 03/13/22 0753 73     Resp 03/13/22 0753 16     Temp 03/13/22 0753 (!) 97.3 F (36.3 C)     Temp Source 03/13/22 0753 Oral     SpO2 03/13/22 0753 97 %   Constitutional: Alert. Smells of EtOH and appears generally unkept. No acute distress. Patient is conversational.  Eyes: Conjunctivae are normal. PERRL.  Head: Atraumatic. Nose: No congestion/rhinnorhea. Mouth/Throat: Mucous membranes are moist.  Oropharynx non-erythematous. No tongue injury.  Neck: No stridor.   Cardiovascular: Normal rate, regular rhythm. Good  peripheral circulation. Grossly normal heart sounds.   Respiratory: Normal respiratory effort.  No retractions. Lungs CTAB. Gastrointestinal: Soft and nontender. No distention.  Musculoskeletal: No lower extremity tenderness nor edema. No gross deformities of extremities. Neurologic:  Normal speech and language. No gross focal neurologic deficits are appreciated.  Skin:  Skin is warm, dry and intact. No rash noted.  ____________________________________________   LABS (all labs ordered are listed, but only abnormal results are displayed)  Labs Reviewed  COMPREHENSIVE METABOLIC PANEL - Abnormal; Notable for the following components:      Result Value   Calcium 8.5 (*)    AST 302 (*)    ALT 125 (*)    Alkaline Phosphatase 145 (*)    All other components within normal limits  ETHANOL - Abnormal; Notable for the following components:   Alcohol, Ethyl (B) 447 (*)    All other components within normal limits  CBC WITH DIFFERENTIAL/PLATELET - Abnormal; Notable for the following components:   RBC 4.07 (*)    Platelets 95 (*)    All other components within normal limits  RAPID URINE DRUG SCREEN, HOSP PERFORMED   ____________________________________________   PROCEDURES  Procedure(s) performed:   Procedures  None  ____________________________________________   INITIAL IMPRESSION / ASSESSMENT AND PLAN / ED COURSE  Pertinent labs & imaging results that were available during my care of the patient were reviewed by me and considered in my medical decision making (see chart for details).   This patient is Presenting for Evaluation of AMS, which does require  a range of treatment options, and is a complaint that involves a high risk of morbidity and mortality.  The Differential Diagnoses includes but is not exclusive to alcohol, illicit or prescription medications, intracranial pathology such as stroke, intracerebral hemorrhage, fever or infectious causes including sepsis, hypoxemia,  uremia, trauma, endocrine related disorders such as diabetes, hypoglycemia, thyroid-related diseases, etc.   Critical Interventions-    Medications  thiamine tablet 100 mg (100 mg Oral Given 03/13/22 0931)  sodium chloride 0.9 % bolus 1,000 mL (0 mLs Intravenous Stopped 03/13/22 1120)  levETIRAcetam (KEPPRA) tablet 500 mg (500 mg Oral Given 03/13/22 0819)    Reassessment after intervention: Patient remains awake/alert and is tolerating PO.    I did obtain Additional Historical Information from EMS, as the patient is altered.  I decided to review pertinent External Data, and in summary patient with multiple ED visits for EtOH and question of breakthrough seizure this year.   Clinical Laboratory Tests Ordered, included EtOH level of 447.  No acute kidney injury.  LFT elevation similar to prior.  UDS negative.  Radiologic Tests: Considered CT head but patient is awake and providing a history. No outward sign of head trauma. Known seizure disorder with med non-compliance and EtOH abuse. Will defer imaging for now.    Social Determinants of Health Risk patient with EtOH (active) and unstable housing situation.   Consult -  Case Mgmt who evaluated and enrolled patient with Rx MATCH program. Was able to send meds to [facility] pharmacy and Keppra delivered to bedside.    Medical Decision Making: Summary:  Patient presents emergency department for evaluation of being found somewhat altered.  Appears intoxicated clinically.  No outward sign of head trauma.  Defer CT imaging for now but will obtain screening blood work and give oral doses of thiamine and Keppra.  Have consulted case management as the patient describes having issues with affording his Keppra.   Reevaluation with update and discussion with patient. He is up and ambulatory without assistance. Meds at bedside for d/c.   Disposition: discharge  ____________________________________________  FINAL CLINICAL IMPRESSION(S) / ED  DIAGNOSES  Final diagnoses:  Alcoholic intoxication without complication (HCC)    Note:  This document was prepared using Dragon voice recognition software and may include unintentional dictation errors.  Alona Bene, MD, [facility] Emergency Medicine    Collis Thede, Arlyss Repress, MD 03/13/22 [id number]

## 2022-03-13 NOTE — Progress Notes (Signed)
Transition of Care [facility]) - Emergency Department Mini Assessment   Patient Details  Name: Dwayne Bolton MRN: [id number] Date of Birth: 06-09-1976  Transition of Care [facility]) CM/SW Contact:    Lavenia Atlas, RN Phone Number: 03/13/2022, 12:51 PM   Clinical Narrative: Patient presented to [facility] ED via [facility] for ETOH intoxication. RNCM received TOC consult for medication assistance (keppra).    ED Mini Assessment:  RNCM called patient's room due to covering remotely, however patient did not answer. RNCM spoke with [facility] ED secretary who went to the patient's room, patient was asleep. Per chart review patient has a PCP and is homeless. Patient has a history of alcohol abuse and epilepsy, poorly compliant with Keppra due to cost. This RNCM approved for MATCH in an effort for patient to receive keppra.    TOC will continue to follow.    Patient Contact and Communications        Admission diagnosis:  ETOH Patient Active Problem List   Diagnosis Date Noted   Seizure (HCC) 07/11/2021   Alcohol withdrawal delirium (HCC) 07/09/2021   Acute lower UTI 07/09/2021   Tobacco dependence 12/31/2015   Seizure disorder (HCC) 12/31/2015   Convulsion (HCC)    Alcohol withdrawal seizure (HCC) 03/22/2015   GAD (generalized anxiety disorder) 01/13/2014   Adjustment disorder with mixed anxiety and depressed mood 01/13/2014   Seizures (HCC) 01/12/2014   Elevated liver enzymes 01/12/2014   Alcohol dependence (HCC) 01/11/2014   PCP:  Grayce Sessions, NP Pharmacy:   [id number] - [place] - [address] [place] ([place]) [place] [address] Phone: [phone] Fax: [phone]

## 2022-03-13 NOTE — Discharge Instructions (Signed)
Substance Abuse Treatment Programs ° °Intensive Outpatient Programs °[facility]     °[address]      °[place], [place]                   °[phone]      ° °[facility] °[address] °[place], [place] °[phone] ° °[facility]     °(Inpatient and outpatient)     °[address]           °[phone]   ° °[facility] °[phone] (Suboxone and Methadone) ° °[address]      °[place], [place] [address]      °336-882-2125      ° °[address] °[place], [place] °852-3033 ° °[facility] (Outpatient/Inpatient, [facility])    °(insurance only) [phone]      °       °[facility] (Groups & Residential) °[place], [place] °[phone] ° °   °[facility]     °[address]     °[place], [place]      °[phone]      ° °[facility] (for caregivers and family) °[address] °[place], [place] °336-299-4655 ° ° ° ° ° °Residential Treatment Programs °[facility]      °[address], [place], [place] [address]  °([phone]      ° °[facility] °[address] [place], [place] [address] °[phone] ° °[facility]        °336-248-8914      ° °[facility] °1-800-659-3381 ° °[facility]             °[address]                                         °[place], [place]                                                °877-615-2722 or 336-784-9470                              ° °[facility] [place] °[address] °[place], [address] °1.877.941.8954 ° °[facility]    °[address]      °[place], [place]     °[phone]      ° °[facility] °[address] °[place], [place] °336-285-9073 ° °[facility]   °[address]     °[place], [place] [address]     °[phone]      °Admissions: 8am-3pm M-F ° °[facility] °[address] °[place],  [place] °336-227-7417 ° °[facility]: Residential Program (90 Days)   °[place], [place]      °336-725-8389 or 800-758-6077    ° °[facility] °[place], [place] °(Walk in Hours over the weekend or by referral) ° °[facility] °[address], [place], [place] [address] °([phone]) [phone] ° °Crisis Mobile: [facility]:  1-877-626-1772 (for crisis response 24 hours a day) °[facility] Hotline:      1-800-256-2452 °Outpatient Psychiatry and Counseling ° °[facility]: Mobile Crisis   Management 24 hours:  1-877-626-1772 ° °[facility] sliding scale fee and walk in schedule: M-F 8am-12pm/1pm-3pm °[address]  °[place], [place] [address] °[phone] ° °[facility] °[address] °[place], [place] [address] °336-703-9650 ° °[facility] (Former[facility] known [facility])- new patient walk-in appointments available Monday - Friday 8am -3pm.          °[address] °[place], [place] [address] °336-676-6840 or crisis line- 336-676-6905 ° °[facility]/ Intensive Outpatient Therapy Program °[address] °[place], [place] [address] °336-832-9804 ° °[facility]                  °Crisis Services      °336.641.4993      °[address]     °[place], [place] [address]                ° °[facility]   °[facility] °[phone] °[address] °[place], [place] [address] ° ° °[facility]          °[address]  °[place], [place] [address]       °(336) [phone] ° °[facility] °[address] °[place], [place] [address] °336-292-1510 ° °[facility]    °[address]    °[place], [place] [address]     °336-632-3505      ° °Parish McKinney, MD     °[address]     °[place] [address]     °336-282-1251     °  °[facility] °[address] °[place] [address] ° °[facility]     °[address]     °[place], [place]      °336-542-2076      ° °[facility] °Shamsher Ahluwalia, MD °[address] °[place], [place] [address] °336-420-9558 ° °[facility]     °[address]     °[place], [place] [address]     °336-274-1237      ° °[facility] °[address] °[place], [place] [address] °336-854-4450 °Resources for Temporary Residential Assistance/Crisis Centers ° °DAY CENTERS °[facility] °M-F 8am-3pm   °[address] [place], [place] [address]   336-332-0824 °Services include: laundry, barbering, support groups, case management, phone  & computer access, showers, AA/NA mtgs, mental health/substance abuse nurse, job skills class, disability information, VA assistance, spiritual classes, etc.  ° °HOMELESS SHELTERS ° °[facility]     °[facility]   °[address], [place]     °336.271.5959       °       °[facility] (women and children)       °[address] °[place], [place] [address] °336-275-0820 °[email or web address] for application and process °Application Required ° °[facility]   °[address]    °[place] [address]     °[phone]       °             °[facility] °[address] °,  [address] °336.273.5572 °336-235-0363(schedule application appt.) °Application Required ° °[facility] (women only)    °[address]     °[place],  [address]     °336-884-1039      °  Intake starts 6pm daily °Need valid ID, SSC, & Police report °[facility] [place] °[address] °[place], [place] °336-881-5420 °Application Required ° °[facility] (men only)     °[address]      °[place], [place]     °[phone]      ° °[facility] °(Pregnant women only) °[address] °[place], [place] °336-275-0206 ° °[facility]      °[address]      °[place], [place] [address]     °336-722-9951      °       °[facility] °[address] °[place], [place] °336-723-1848 °90 day commitment/SA/Application process ° °[facility](men only)     °[address]     °[place], [place]     °336-748-1962       °Check-in at 7pm     °       °[facility] °[address] °[place],  [address] °336-248-6684 °Men/Women/Women and Children must be there by 7 pm ° °[facility] °[place],  °336-722-8721                ° °

## 2022-03-13 NOTE — ED Notes (Signed)
Pt in room resting. NAD chest rising and falling.  

## 2022-03-13 NOTE — ED Notes (Signed)
Pt up in bed eating sandwich and drinking

## 2022-03-13 NOTE — ED Notes (Signed)
Pt still in room resting. NAD Pt given sandwich and drink

## 2022-03-13 NOTE — Progress Notes (Signed)
  MATCH Medication Assistance Card Name: Freeland Pracht ID (MRN): [id number] Bin: [id number] RX Group: [id number] Discharge Date: 03/13/22 Expiration Date:03/27/22                                           (must be filled within 7 days of discharge)     You have been approved to have the prescriptions written by your discharging physician filled through our [facility] (Medication Assistance Through [facility]) program. This program allows for a one-time (no refills) 34-day supply of selected medications for a low copay amount.  The copay is $3.00 per prescription. For instance, if you have one prescription, you will pay $3.00; for two prescriptions, you pay $6.00; for three prescriptions, you pay $9.00; and so on.  Only certain pharmacies are participating in this program with [facility]. You will need to select one of the pharmacies from the attached list and take your prescriptions, this letter, and your photo ID to one of the [facility] pharmacies, [facility], [facility] [address], or [facility] [address].   We are excited that you are able to use the [facility] program to get your medications. These prescriptions must be filled within 7 days of hospital discharge or they will no longer be valid for the [facility] program. Should you have any problems with your prescriptions please contact your case management team member at [phone] for [place].  Thank you, [facility]

## 2022-03-13 NOTE — ED Triage Notes (Signed)
Pt BIB [facility] for ETOH intoxication. Pt found asleep on the side of the road, asking to be seen for ETOH. Pt reports drinking 2 beers but EMS reports 1/2 empty bottle of grain alcohol next to pt on scene. Denies other complaints. VSS.

## 2023-05-29 IMAGING — CT CT HEAD W/O CM
4 series · 16 of 47 positions shown, 18 images · non-contrast
Comparison: 11/13/2020

CLINICAL DATA: Intoxication and altered mental status

EXAM:
CT HEAD WITHOUT CONTRAST
TECHNIQUE: Contiguous axial images were obtained from the base of the skull
through the vertex without intravenous contrast.

[Series 3: head without · axial · non-contrast · 0.43mm/px · z∈[+1263,+1383]mm · 7 of 32 slices shown, 9 images]
[im 4/32  brain]
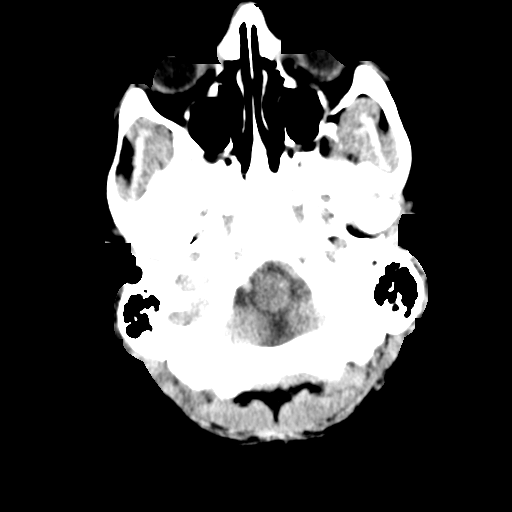
[im 4/32  bone]
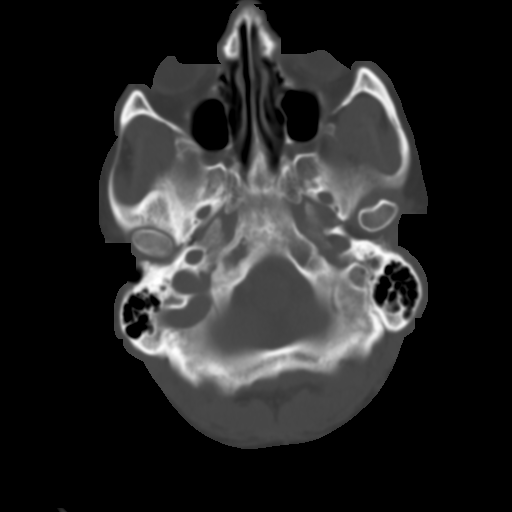
[im 8/32  brain]
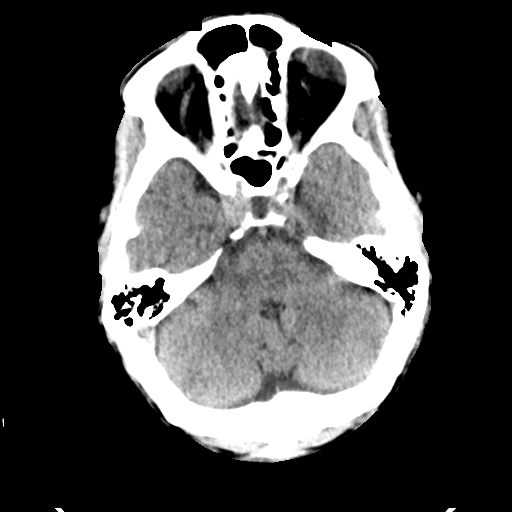
[im 12/32  brain]
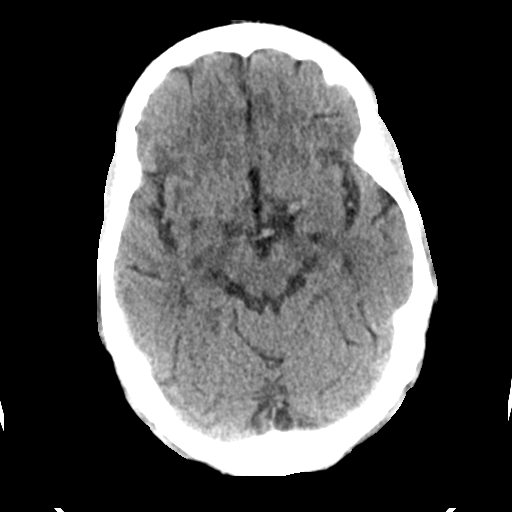
[im 16/32  brain]
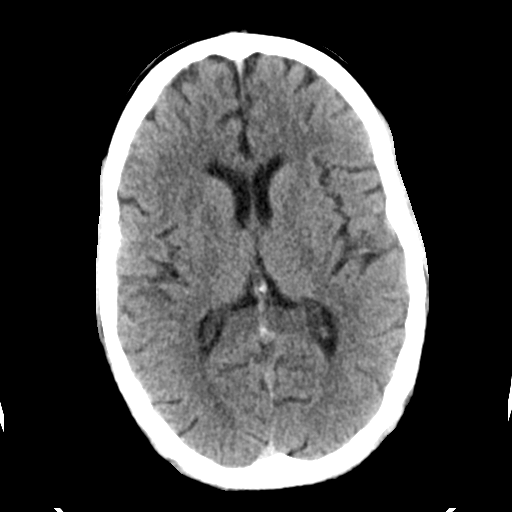
[im 20/32  brain]
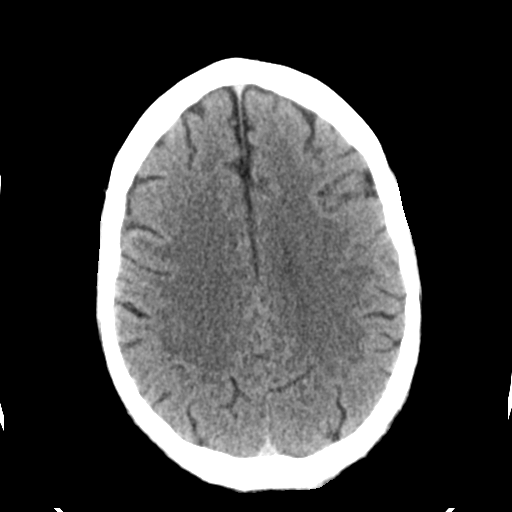
[im 20/32  bone]
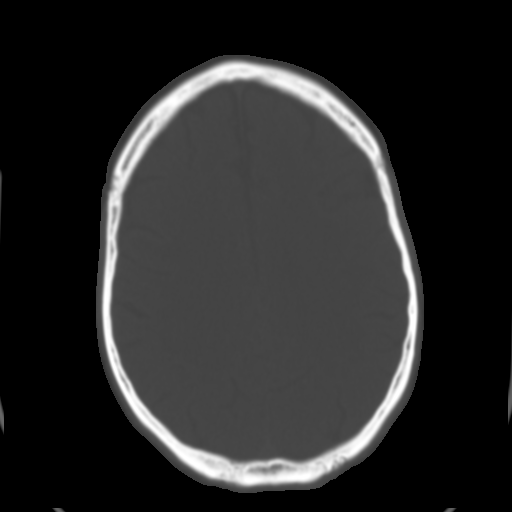
[im 24/32  brain]
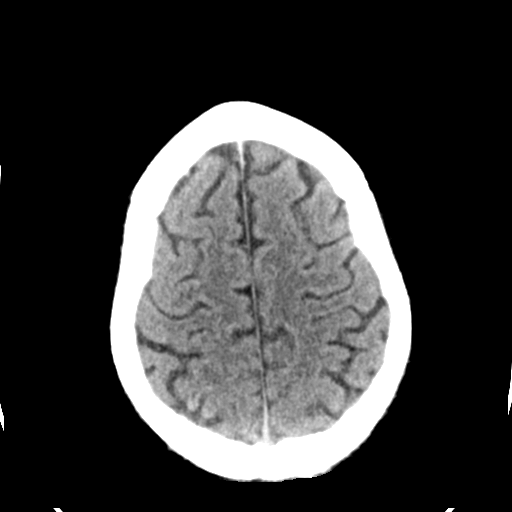
[im 28/32  brain]
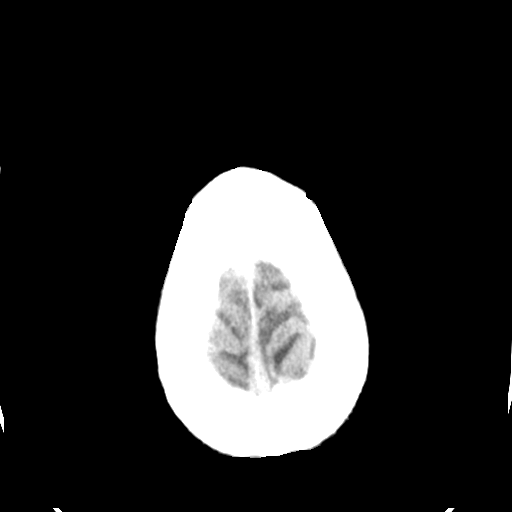

[Series 4: head bone · axial · 0.43mm/px · z∈[+1262,+1294]mm · 3 of 80 slices shown]
[im 8/80  bone]
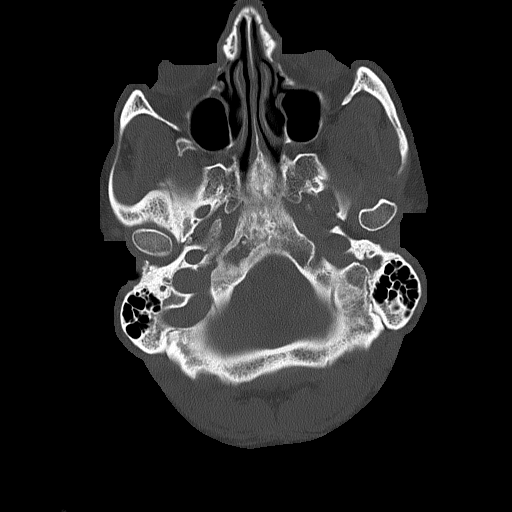
[im 16/80  bone]
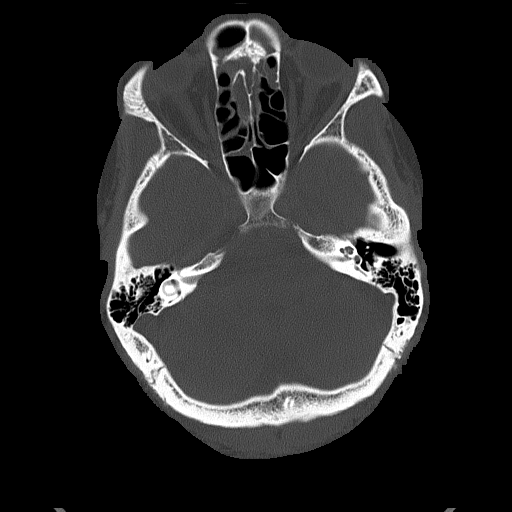
[im 24/80  bone]
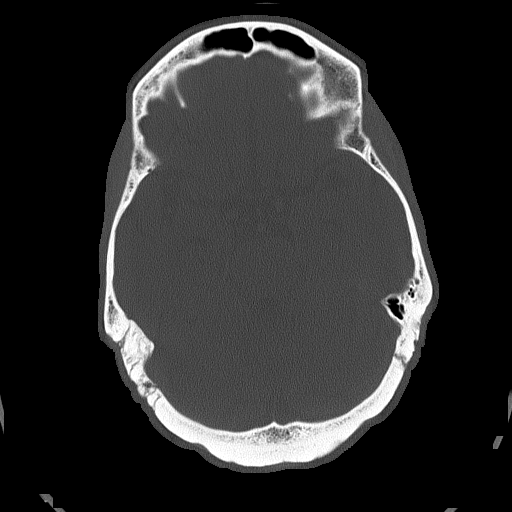

[Series 5: head without cor · coronal · non-contrast · 0.32mm/px · 3 of 71 slices shown]
[im 24/71  brain]
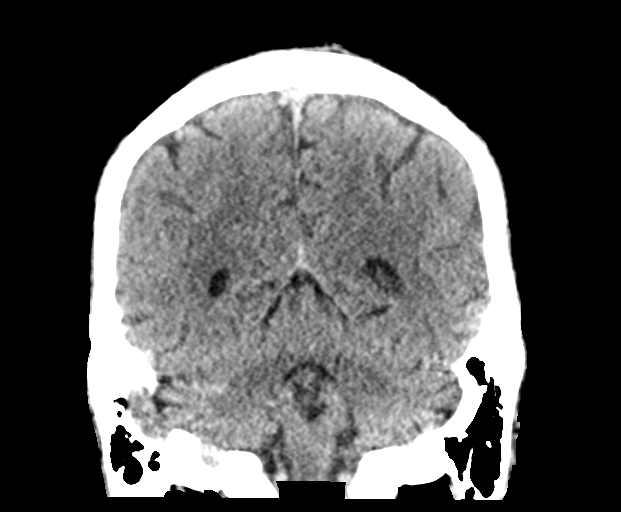
[im 32/71  brain]
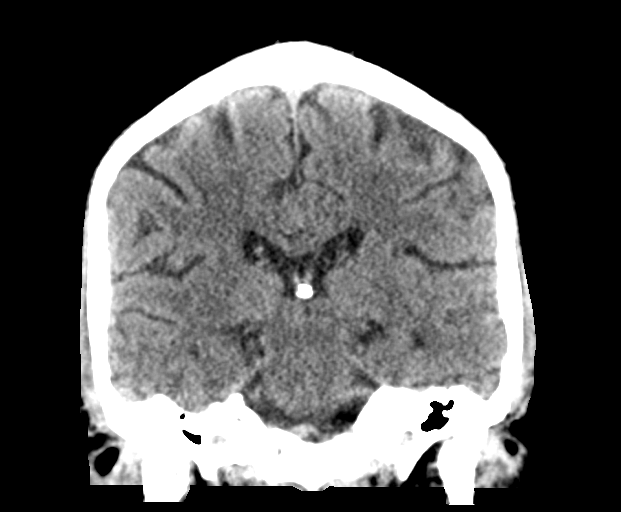
[im 39/71  brain]
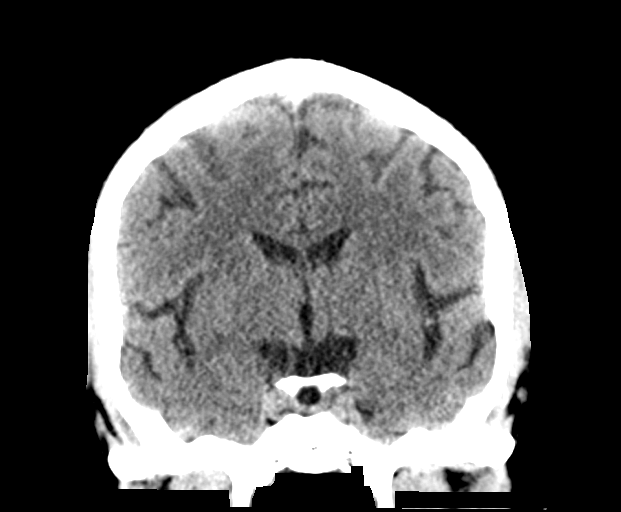

[Series 6: head without sag · sagittal · non-contrast · 0.34mm/px · 3 of 66 slices shown]
[im 22/66  brain]
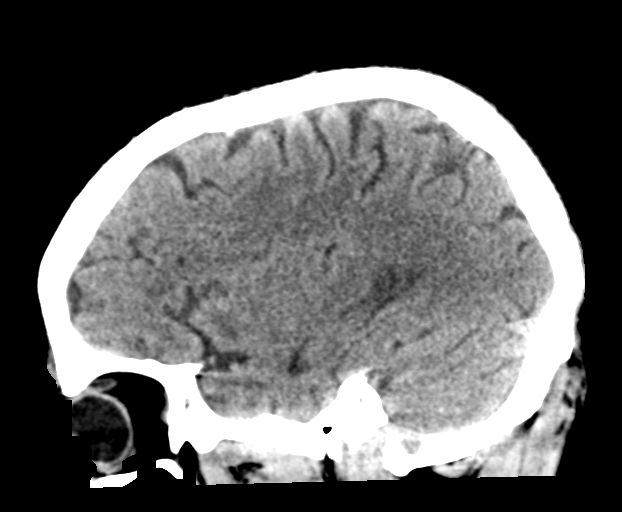
[im 33/66  brain]
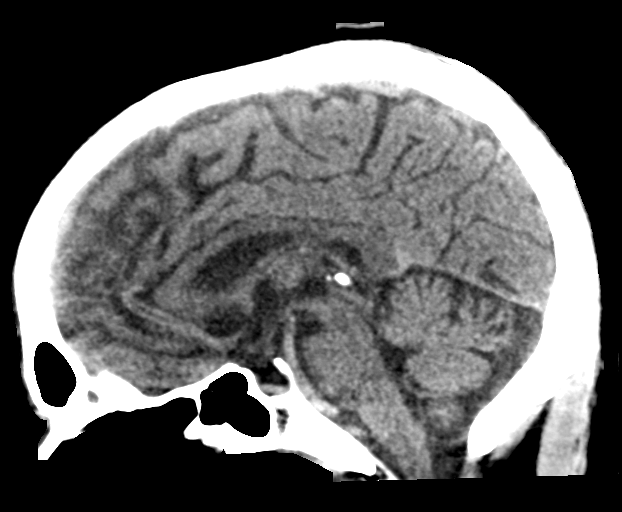
[im 44/66  brain]
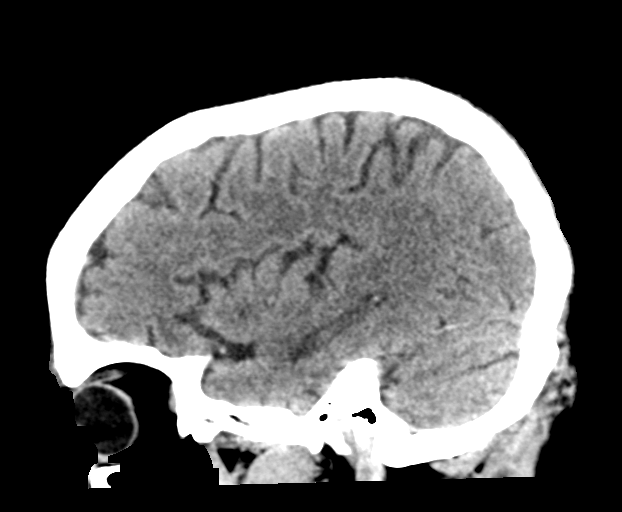

[16 of 47 positions shown; findings below may reference images not displayed]

FINDINGS: Brain: No evidence of acute infarction, hemorrhage, hydrocephalus,
extra-axial collection or mass lesion/mass effect.

Vascular: No hyperdense vessel or unexpected calcification.

Skull: Normal. Negative for fracture or focal lesion.

Sinuses/Orbits: No acute finding.

Other: None.
IMPRESSION: No acute intracranial abnormality noted.

## 2023-06-07 ENCOUNTER — Emergency Department (HOSPITAL_COMMUNITY)
Admission: EM | Admit: 2023-06-07 | Discharge: 2023-06-08 | Discharge status: Against Medical Advice | Payer: Self-pay | Attending: Emergency Medicine | Admitting: Emergency Medicine

## 2023-06-07 ENCOUNTER — Other Ambulatory Visit: Payer: Self-pay

## 2023-06-07 DIAGNOSIS — Z5321 Procedure and treatment not carried out due to patient leaving prior to being seen by health care provider: Secondary | ICD-10-CM | POA: Insufficient documentation

## 2023-06-07 DIAGNOSIS — F10920 Alcohol use, unspecified with intoxication, uncomplicated: Secondary | ICD-10-CM

## 2023-06-07 DIAGNOSIS — Y908 Blood alcohol level of 240 mg/100 ml or more: Secondary | ICD-10-CM | POA: Insufficient documentation

## 2023-06-07 DIAGNOSIS — W19XXXA Unspecified fall, initial encounter: Secondary | ICD-10-CM

## 2023-06-07 DIAGNOSIS — E876 Hypokalemia: Secondary | ICD-10-CM

## 2023-06-07 LAB — BASIC METABOLIC PANEL
Anion gap: 18 — ABNORMAL HIGH (ref 5–15)
BUN: <5 mg/dL — ABNORMAL LOW (ref 6–20)
CO2: 25 mmol/L (ref 22–32)
Calcium: 8.1 mg/dL — ABNORMAL LOW (ref 8.9–10.3)
Chloride: 96 mmol/L — ABNORMAL LOW (ref 98–111)
Creatinine, Ser: 0.84 mg/dL (ref 0.61–1.24)
GFR, Estimated: >60 mL/min (ref >60)
Glucose, Bld: 101 mg/dL — ABNORMAL HIGH (ref 70–99)
Potassium: 2.6 mmol/L — CL (ref 3.5–5.1)
Sodium: 139 mmol/L (ref 135–145)

## 2023-06-07 LAB — CBC
HCT: 33.9 % — ABNORMAL LOW (ref 39.0–52.0)
Hemoglobin: 12.3 g/dL — ABNORMAL LOW (ref 13.0–17.0)
MCH: 33.8 pg (ref 26.0–34.0)
MCHC: 36.3 g/dL — ABNORMAL HIGH (ref 30.0–36.0)
MCV: 93.1 fL (ref 80.0–100.0)
Platelets: 70 10*3/uL — ABNORMAL LOW (ref 150–400)
RBC: 3.64 MIL/uL — ABNORMAL LOW (ref 4.22–5.81)
RDW: 14.4 % (ref 11.5–15.5)
WBC: 5.4 10*3/uL (ref 4.0–10.5)
nRBC: 0 % (ref 0.0–0.2)

## 2023-06-07 LAB — ETHANOL: Alcohol, Ethyl (B): 551 mg/dL (ref <10)

## 2023-06-07 NOTE — ED Triage Notes (Signed)
Patient BIB [facility] [employer] [facility] [address]. ETOH, unsure if patient had a fall or seizure. GCS 15. Patient has not been taking keppra. Left left and back pain. Patient states he fell from tree three days ago . 122/84, 96, 16, 93RA, CBG 122.

## 2023-06-07 NOTE — ED Provider Notes (Signed)
I was informed this patient eloped from the emergency department prior to being seen by any provider.   Glyn Ade, MD 06/07/23 2249

## 2023-06-07 NOTE — ED Notes (Signed)
Pt called to go back to assigned room x2, no answer

## 2023-06-07 NOTE — ED Notes (Signed)
Patient appropriate to go to waiting room per Dr. Durwin Nora.

## 2023-06-07 NOTE — ED Notes (Signed)
RN got report of critical lab values, provider notified and bed arranged however pt could not be found in the lobby.  No phone number on file.

## 2023-07-21 DEATH — deceased
